# Patient Record
Sex: Female | Born: 1935
Health system: Southern US, Community
[De-identification: ages and names within clinical notes are randomized; demographics above are authoritative.]

## PROBLEM LIST (undated history)

## (undated) DIAGNOSIS — S064XAA Epidural hemorrhage with loss of consciousness status unknown, initial encounter: Secondary | ICD-10-CM

## (undated) DIAGNOSIS — I609 Nontraumatic subarachnoid hemorrhage, unspecified: Secondary | ICD-10-CM

## (undated) DIAGNOSIS — J189 Pneumonia, unspecified organism: Secondary | ICD-10-CM

## (undated) DIAGNOSIS — S064X9A Epidural hemorrhage with loss of consciousness of unspecified duration, initial encounter: Secondary | ICD-10-CM

## (undated) DIAGNOSIS — I639 Cerebral infarction, unspecified: Secondary | ICD-10-CM

## (undated) DIAGNOSIS — J9600 Acute respiratory failure, unspecified whether with hypoxia or hypercapnia: Secondary | ICD-10-CM

## (undated) DIAGNOSIS — A419 Sepsis, unspecified organism: Secondary | ICD-10-CM

## (undated) DIAGNOSIS — I4891 Unspecified atrial fibrillation: Secondary | ICD-10-CM

---

## 1999-02-21 HISTORY — PX: REPLACEMENT TOTAL KNEE: SUR1224

## 1999-11-22 ENCOUNTER — Encounter: Payer: Self-pay | Admitting: Orthopedic Surgery

## 1999-11-28 ENCOUNTER — Inpatient Hospital Stay (HOSPITAL_COMMUNITY): Admission: RE | Admit: 1999-11-28 | Discharge: 1999-12-02 | Payer: Self-pay | Admitting: Orthopedic Surgery

## 1999-12-13 ENCOUNTER — Ambulatory Visit (HOSPITAL_COMMUNITY): Admission: RE | Admit: 1999-12-13 | Discharge: 1999-12-13 | Payer: Self-pay | Admitting: Orthopedic Surgery

## 2002-11-14 ENCOUNTER — Encounter: Admission: RE | Admit: 2002-11-14 | Discharge: 2002-11-14 | Payer: Self-pay | Admitting: Internal Medicine

## 2002-11-14 ENCOUNTER — Encounter: Payer: Self-pay | Admitting: Internal Medicine

## 2003-01-26 ENCOUNTER — Ambulatory Visit (HOSPITAL_COMMUNITY): Admission: RE | Admit: 2003-01-26 | Discharge: 2003-01-26 | Payer: Self-pay | Admitting: *Deleted

## 2003-01-26 ENCOUNTER — Encounter (INDEPENDENT_AMBULATORY_CARE_PROVIDER_SITE_OTHER): Payer: Self-pay | Admitting: Specialist

## 2003-02-10 ENCOUNTER — Encounter (INDEPENDENT_AMBULATORY_CARE_PROVIDER_SITE_OTHER): Payer: Self-pay | Admitting: *Deleted

## 2003-02-10 ENCOUNTER — Ambulatory Visit (HOSPITAL_COMMUNITY): Admission: RE | Admit: 2003-02-10 | Discharge: 2003-02-10 | Payer: Self-pay | Admitting: *Deleted

## 2003-03-01 ENCOUNTER — Emergency Department (HOSPITAL_COMMUNITY): Admission: EM | Admit: 2003-03-01 | Discharge: 2003-03-01 | Payer: Self-pay | Admitting: Emergency Medicine

## 2003-03-02 ENCOUNTER — Other Ambulatory Visit: Admission: RE | Admit: 2003-03-02 | Discharge: 2003-03-02 | Payer: Self-pay | Admitting: Internal Medicine

## 2003-04-09 ENCOUNTER — Encounter (INDEPENDENT_AMBULATORY_CARE_PROVIDER_SITE_OTHER): Payer: Self-pay | Admitting: *Deleted

## 2003-04-09 ENCOUNTER — Ambulatory Visit (HOSPITAL_COMMUNITY): Admission: RE | Admit: 2003-04-09 | Discharge: 2003-04-09 | Payer: Self-pay | Admitting: *Deleted

## 2003-08-04 ENCOUNTER — Ambulatory Visit (HOSPITAL_COMMUNITY): Admission: RE | Admit: 2003-08-04 | Discharge: 2003-08-04 | Payer: Self-pay | Admitting: *Deleted

## 2005-02-21 ENCOUNTER — Encounter: Admission: RE | Admit: 2005-02-21 | Discharge: 2005-02-21 | Payer: Self-pay | Admitting: Internal Medicine

## 2005-03-13 ENCOUNTER — Encounter: Admission: RE | Admit: 2005-03-13 | Discharge: 2005-03-13 | Payer: Self-pay | Admitting: Internal Medicine

## 2005-12-08 ENCOUNTER — Emergency Department (HOSPITAL_COMMUNITY): Admission: EM | Admit: 2005-12-08 | Discharge: 2005-12-08 | Payer: Self-pay | Admitting: Emergency Medicine

## 2006-01-05 ENCOUNTER — Observation Stay (HOSPITAL_COMMUNITY): Admission: EM | Admit: 2006-01-05 | Discharge: 2006-01-06 | Payer: Self-pay | Admitting: Emergency Medicine

## 2006-01-19 ENCOUNTER — Inpatient Hospital Stay (HOSPITAL_COMMUNITY): Admission: RE | Admit: 2006-01-19 | Discharge: 2006-01-23 | Payer: Self-pay | Admitting: *Deleted

## 2006-01-20 ENCOUNTER — Encounter (INDEPENDENT_AMBULATORY_CARE_PROVIDER_SITE_OTHER): Payer: Self-pay | Admitting: Specialist

## 2006-02-26 ENCOUNTER — Encounter: Admission: RE | Admit: 2006-02-26 | Discharge: 2006-02-26 | Payer: Self-pay | Admitting: Internal Medicine

## 2006-05-08 ENCOUNTER — Ambulatory Visit (HOSPITAL_COMMUNITY): Admission: RE | Admit: 2006-05-08 | Discharge: 2006-05-08 | Payer: Self-pay | Admitting: *Deleted

## 2006-05-08 ENCOUNTER — Encounter (INDEPENDENT_AMBULATORY_CARE_PROVIDER_SITE_OTHER): Payer: Self-pay | Admitting: Specialist

## 2006-08-06 ENCOUNTER — Other Ambulatory Visit: Admission: RE | Admit: 2006-08-06 | Discharge: 2006-08-06 | Payer: Self-pay | Admitting: Internal Medicine

## 2007-07-02 ENCOUNTER — Encounter: Admission: RE | Admit: 2007-07-02 | Discharge: 2007-07-02 | Payer: Self-pay | Admitting: Internal Medicine

## 2008-07-15 ENCOUNTER — Inpatient Hospital Stay (HOSPITAL_COMMUNITY): Admission: EM | Admit: 2008-07-15 | Discharge: 2008-07-18 | Payer: Self-pay | Admitting: Emergency Medicine

## 2010-05-31 LAB — COMPREHENSIVE METABOLIC PANEL
ALT: 27 U/L (ref 0–35)
AST: 44 U/L — ABNORMAL HIGH (ref 0–37)
Alkaline Phosphatase: 130 U/L — ABNORMAL HIGH (ref 39–117)
Chloride: 106 mEq/L (ref 96–112)
GFR calc Af Amer: >60 mL/min (ref >60)
GFR calc non Af Amer: 54 mL/min — ABNORMAL LOW (ref >60)
Sodium: 140 mEq/L (ref 135–145)
Total Protein: 7.1 g/dL (ref 6.0–8.3)

## 2010-05-31 LAB — CBC
HCT: 45.9 % (ref 36.0–46.0)
Hemoglobin: 16.5 g/dL — ABNORMAL HIGH (ref 12.0–15.0)
MCHC: 35.9 g/dL (ref 30.0–36.0)
MCV: 84.4 fL (ref 78.0–100.0)
Platelets: ADEQUATE 10*3/uL (ref 150–400)
RBC: 5.44 MIL/uL — ABNORMAL HIGH (ref 3.87–5.11)
WBC: 8.4 10*3/uL (ref 4.0–10.5)

## 2010-05-31 LAB — DIFFERENTIAL
Eosinophils Relative: 1 % (ref 0–5)
Lymphocytes Relative: 21 % (ref 12–46)
Lymphs Abs: 1.8 10*3/uL (ref 0.7–4.0)
Monocytes Relative: 7 % (ref 3–12)
Neutro Abs: 5.8 10*3/uL (ref 1.7–7.7)

## 2010-05-31 LAB — GLUCOSE, CAPILLARY
Glucose-Capillary: 151 mg/dL — ABNORMAL HIGH (ref 70–99)
Glucose-Capillary: 170 mg/dL — ABNORMAL HIGH (ref 70–99)
Glucose-Capillary: 170 mg/dL — ABNORMAL HIGH (ref 70–99)

## 2010-07-05 NOTE — Op Note (Signed)
Alison Doyle, Alison Doyle                  ACCOUNT NO.:  [id number]   MEDICAL RECORD NO.:  [id number]          PATIENT TYPE:  INP   LOCATION:  1823                         FACILITY:  [facility]   PHYSICIAN:  Thomas A. Cornett, M.D.DATE OF BIRTH:  14-Dec-1935   DATE OF PROCEDURE:  07/15/2008  DATE OF DISCHARGE:                               OPERATIVE REPORT   PREOPERATIVE DIAGNOSIS:  Incisional hernia, incarcerated.   POSTOPERATIVE DIAGNOSIS:  Incisional hernia, incarcerated.   PROCEDURE:  Repair of incarcerated incisional hernia.   SURGEON:  Maisie Fus A. Cornett, MD   ASSISTANT:  Kelle Darting. Rennis Harding, NP   ANESTHESIA:  General endotracheal anesthesia with 0.25% Sensorcaine  local.   ESTIMATED BLOOD LOSS:  20 mL.   SPECIMEN:  None.   INDICATIONS FOR PROCEDURE:  The patient is a 75 year old female who has  had a previous laparoscopic cholecystectomy in 2007 by Dr. Gwendlyn Deutscher.  She has developed a hernia in one of her port sites and came in  today with an incarcerated hernia which is an old incisional hernia.  She presents to the operating room for repair of this.   DESCRIPTION OF PROCEDURE:  The patient was brought to the operating  room.  After being placed supine, general anesthesia was initiated and  the periumbilical region was prepped and draped in sterile fashion.  A 1  cm incision was made and we were able to identify the hernia sac.  I  opened the hernia sac and reduced the omentum, but there was no bowel  in it.  I trimmed up the hernia sac.  I then used a roughly 10 x 10  piece of Proceed mesh and placed it in an underlay position in the  intraperitoneal cavity using #1 Novofil, secured to the undersurface of  the fascia with at least 3 cm of underlay.  After doing this, I checked  for any areas of weakness or any areas where bowel could slide through  this and could not feel any.  I irrigated out the cavity and then closed  the fascia with #1 running PDS.  Irrigation was  used.  I placed the  drain in subcutaneous fat.  Due to the dissection, I had to do and close  the skin with staples.  This was placed to bulb suction.  All final  counts of sponge, needle, and instruments were found be correct at this  portion of the case.  The patient was then awoke, taken to the recovery  room in satisfactory condition.  All final counts were found to be  correct.  The drain was placed to bulb suction.     Thomas A. Cornett, M.D.  Electronically Signed    TAC/MEDQ  D:  07/15/2008  T:  07/16/2008  Job:  [id number]

## 2010-07-05 NOTE — Discharge Summary (Signed)
Alison Doyle                  ACCOUNT NO.:  [id number]   MEDICAL RECORD NO.:  [id number]          PATIENT TYPE:  INP   LOCATION:  5151                         FACILITY:  [facility]   PHYSICIAN:  Alison Doyle, Alison DoyleDATE OF BIRTH:  [birth year]   DATE OF ADMISSION:  07/15/2008  DATE OF DISCHARGE:  07/18/2008                               DISCHARGE SUMMARY   PRIMARY CARE PHYSICIAN:  Alison Murphy. Renne Crigler, MD   DISCHARGING PHYSICIAN:  Alison Paris, MD   PROCEDURES:  Open incisional hernia repair with Proceed mesh on Jul 15, 2008.   CONSULTANTS:  None.   REASON FOR ADMISSION:  Alison Doyle is a 75 year old white female who had a  laparoscopic cholecystectomy in 2007 and subsequently developed an  umbilical hernia.  Over the last several months, she states this has  really increased in size and on the day prior to admission, she  developed an acute episode of pain at her hernia.  She denied any nausea  or vomiting and continued to have normal bowel movement and passed  flatus.  At the emergency department, she had a CT scan, which showed a  umbilical hernia that only contained fat.  However, due to her pain, she  was admitted for repair.  Please see admitting history and physical for  further details.   ADMITTING DIAGNOSES:  1. Incarcerated incisional hernia, status post laparoscopic      cholecystectomy.  2. Borderline diabetes mellitus, which the patient states is diet      controlled.   HOSPITAL COURSE:  At this time, the patient was admitted and taken  straight to the operating room where a repair of an incisional hernia  with Proceed mesh was completed.  The patient tolerated this procedure  well.  On postoperative day 1, the patient was complaining of quite a  bit of pain, but was already tolerating regular diet.  She had already  taken some Percocet; however, this turned her skin red as if she had  like a red-man type syndrome and complained of a rash under her breast.  At this time, her Percocet was discontinued and she was put on p.o.  Dilaudid for pain.  She was also given a nystatin powder for what  appeared to be a candidal infection under her breast.  On postoperative  day 2, she was still continuing to have quite a bit of pain, although it  was beginning to get better and by postoperative day 3, the patient had  been ambulating pretty well and was doing much better.  At this point,  she was not having any more pain, tolerating her regular diet and very  eager to be discharged home.   The patient did have a JP drain placed during surgery.  Throughout the  hospitalization, this remained serosanguineous with minimal output.   DISCHARGING DIAGNOSES:  1. Incarcerated incisional hernia.  2. Status post open incisional hernia repair with Proceed mesh.  3. Candida of the breast, improved.  4. Diet-controlled diabetes mellitus.   DISCHARGE MEDICATIONS:  The patient is informed she may resume all  prehospital medications including;  1. Tramadol 50 mg 2 tablets q.4 h. as needed for pain.  2. Aspirin 81 mg daily.   DISCHARGE INSTRUCTIONS:  The patient is informed to continue a carb-  modified diet for her diabetes.   ACTIVITY:  She may increase her activity slowly and walk up steps.  She  may shower; however, she is not to bathe while her JP drain is in place.  She is not to lift anything greater than 10 pounds for the next 8 weeks.  She is also to remain in her abdominal binder for at least the next 4  weeks as well.  It was noted during surgery that her abdominal fascia  was very thin and that she is potentially prone for recurrent hernia or  other hernias in the future.  She is informed to empty her drain as  needed and recharge.  She is to call for fever greater than 101.5 or  worsening pain.  Otherwise, she is to return to the [facility] on July 28, 2008, at 3:15 p.m. for JP drain and staple removal.  After that, she  is to follow up with Dr.  Luisa Doyle in approximately 1 month.      Alison Doyle, Alison Doyle      Alison Doyle, M.D.  Electronically Signed    KEO/MEDQ  D:  07/18/2008  T:  07/19/2008  Job:  [id number]   cc:   Alison Doyle, M.D.  Alison Doyle, M.D.

## 2010-07-05 NOTE — H&P (Signed)
[name], Alison Doyle                  ACCOUNT NO.:  [id number]   MEDICAL RECORD NO.:  [id number]          PATIENT TYPE:  INP   LOCATION:  5151                         FACILITY:  [facility]   PHYSICIAN:  Thomas A. Cornett, M.D.DATE OF BIRTH:  02/24/1935   DATE OF ADMISSION:  07/15/2008  DATE OF DISCHARGE:                              HISTORY & PHYSICAL   ADMITTING PHYSICIAN:  Maisie Fus A. Cornett, M.D.   PRIMARY CARE PHYSICIAN:  Soyla Murphy. Pharr, M.D.   CHIEF COMPLAINT:  Abdominal pain secondary to umbilical hernia.   HISTORY OF PRESENT ILLNESS:  Alison Doyle is a 75 year old white female who  states she has borderline diabetes; however, this is diet controlled,  who is otherwise healthy.  She states she had a laparoscopic  cholecystectomy in 2007 and since then developed a small umbilical  hernia.  She states over the last several years this is increased in  size.  Over the last 4-5 months, it has really increased in size and has  began to cause her some pain every once in a while.  She states that her  primary care physician, Dr. Renne Crigler, recommended that she see one of our  surgeon and had an appointment next week but developed pain yesterday  that was unrelenting.  Since she developed this pain, she has not had  any nausea or vomiting and is continuing to have bowel movements and  passing flatus.  However, due to this pain, she presented to the  emergency department where subsequently a CT scan of abdomen and pelvis  was done which showed a fat containing umbilical hernia with mild  thickening of the umbilicus without a drainable abscess or direct  communication with the bowel.  Because of this, we were called to see  the patient because this hernia was unable to be reduced.   REVIEW OF SYSTEMS:  Please see HPI, otherwise the patient does admit to  having a rash that just appeared this morning under her breast due to a  new power that she used.  She denies any pain or itching with this rash.  Otherwise, all other systems are negative.   PAST MEDICAL HISTORY:  1. Borderline diabetes mellitus which is diet controlled.  2. Arthritis of the back.   PAST SURGICAL HISTORY:  1. Laparoscopic cholecystectomy in 2007.  2. Left total knee replacement.  3. C-section.   SOCIAL HISTORY:  The patient is widowed.  She admits to smoking  approximately 5-6 cigarettes a day but denies alcohol or illicit drug  abuse.   ALLERGIES:  MORPHINE, this causes itching.   MEDICATIONS:  1. Ultram, dose unknown.  She takes it p.r.n. for back pain.  2. Aspirin 81 mg a day.   PHYSICAL EXAMINATION:  GENERAL:  Alison Doyle is a 75 year old obese white  female who is currently laying in bed in no acute distress after  receiving a dose of Dilaudid.  VITAL SIGNS:  Temperature 97.5, pulse 86, respirations 18, and blood  pressure 129/79.  HEENT:  Head is normocephalic and atraumatic.  Sclerae noninjected.  Pupils are equal, round, and  reactive to light.  Ears and nose without  any obvious masses or lesions.  No rhinorrhea.  Mouth is pink and moist.  Throat shows no exudate.  NECK:  Supple.  Trachea is midline.  No thyromegaly.  HEART:  Regular rate and rhythm.  Normal S1 and S2.  No murmurs,  gallops, or rubs are noted.  A+2 carotid, radial, and pedal pulses  bilaterally.  LUNGS:  Clear to auscultation bilaterally with no wheezes, rhonchi, or  rales noted.  Respiratory effort is nonlabored.  Chest is symmetrical.  ABDOMEN:  Soft and obese.  She does have a large umbilical hernia noted  that is currently not reducible secondary to pain.  This area does have  warmth but is not erythematous.  She does have active bowel sounds and  is obese.  She does have 4 scars noted from her prior laparoscopic  cholecystectomy.  MUSCULOSKELETAL:  All 4 extremities are symmetrical.  No cyanosis,  clubbing, or edema.  SKIN:  Warm and dry.  She does have a rash noted under her bilateral  breast that is erythematous.  No  pain or itching associated with this.  NEUROLOGIC:  Cranial nerves II through XII appear to be grossly intact.  PSYCH:  The patient is alert and oriented x3 with an appropriate affect.   LABORATORY DATA AND DIAGNOSTICS:  White blood cell count 8,400,  hemoglobin 16.5, hematocrit 45.9, platelet count was unable to be  determined, sodium 140, potassium 4.4, glucose 286, BUN 12, creatinine  1.02.  CT scan of the abdomen and pelvis revealed fat containing  umbilical hernia with mild thickening of the umbilicus without a  drainable abscess or direct connection with the bowel.   IMPRESSION:  1. Incarcerated umbilical hernia, status post laparoscopic      cholecystectomy.  2. Borderline diabetes mellitus which is diet controlled per the      patient.   PLAN:  Due to the inability to reduce the patient's hernia, I believe  that it will be beneficial to the patient to get admitted, start on IV  fluids and proceed with surgical intervention to correct this hernia.  The surgical procedure has been explained to the patient including risks  and benefits and at this time she understands and questions were  answered and she wishes to proceed.  She will receive a dose of  cefoxitin on-call to the OR and receive SCDs in the OR as well for DVT  prophylaxis.  We will go ahead and obtain a preoperative EKG and one-  view chest x-ray.      Letha Cape, PA      Maisie Fus A. Cornett, M.D.  Electronically Signed   KEO/MEDQ  D:  07/15/2008  T:  07/16/2008  Job:  [id number]   cc:   Soyla Murphy. Renne Crigler, M.D.

## 2010-07-08 NOTE — Op Note (Signed)
Alison Doyle, Alison Doyle                            ACCOUNT NO.:  [id number]   MEDICAL RECORD NO.:  [id number]                   PATIENT TYPE:  AMB   LOCATION:  ENDO                                 FACILITY:  [facility]   PHYSICIAN:  Georgiana Spinner, M.D.                 DATE OF BIRTH:  [birth year]   DATE OF PROCEDURE:  02/10/2003  DATE OF DISCHARGE:                                 OPERATIVE REPORT   PROCEDURE PERFORMED:  Colonoscopy with polypectomy and biopsy.   ENDOSCOPIST:  Georgiana Spinner, M.D.   INDICATIONS FOR PROCEDURE:  Colon polyps.   ANESTHESIA:  Demerol 100 mg, Versed 10 mg.   DESCRIPTION OF PROCEDURE:  With the patient mildly sedated in the left  lateral decubitus position, the Olympus video colonoscope was inserted in  the rectum and passed through a somewhat mildly tortuous sigmoid colon that  was diverticula filled until we reached the cecum identified by the  ileocecal valve which was fairly prominent and the appendiceal orifice, both  of which were photographed.  From this point the colonoscope was slowly  withdrawn taking circumferential views of the entire colonic mucosa  visualized stopping in the hepatic flexure where a small polyp was seen and  removed using hot biopsy forceps technique setting of 20/20 blended current.  In the proximal transverse colon just distal to this lesion was noted a  larger polyp that was removed using snare cautery technique again a setting  of 20/20 blended current. There was good hemostasis.  There was another  smaller polyp nearby that was also removed using hot biopsy forceps  technique.  The endoscope was then withdrawn all the way to sigmoid colon  where there was a small polyp removed using snare cautery technique again a  setting of 20/20 blended current.  It was in the midst of diverticula.  It  was not photographed. Then at the 20 cm from the anal verge, we found a  large multiheaded polypoid mass that was fairly soft.  I elected  because of  size to biopsy only.  It was multi-centimeters but irregular shaped.  The  softness might indicate that it was a villous adenoma but it certainly had  some characteristics of malignancy and therefore biopsies were taken only of  this lesion.  The endoscope was then withdrawn all the way to the rectum and  in the rectum was a smaller polyp that was removed using a snare cautery  technique, again a setting of 20/20 blended current and the endoscope was  placed on retroflexion to view hemorrhoids that were seen both internal and  external view of the anal canal.  The patient's vital signs and pulse  oximeter remained stable.  The patient tolerated the procedure well without  apparent complications.   FINDINGS:  Internal hemorrhoids.  Sigmoid diverticulosis.  Polyp of the  hepatic flexure that actually was sort of  more linear and possibly ulcerated  that was biopsied.  A larger polyp at the proximal transverse colon, a  smaller polyp that was not photographed in the sigmoid colon near the  diverticulosis that was seen previously.  Again, a large polyp/mass at 20 cm  from the anal verge which was biopsied only.  A smaller polyp of the rectum  which was removed using snare cautery technique.   PLAN:  Await biopsy reports.  Patient will call me for results and follow up  with me as an outpatient.                                               Georgiana Spinner, M.D.    GMO/MEDQ  D:  02/10/2003  T:  02/11/2003  Job:  [id number]

## 2010-07-08 NOTE — H&P (Signed)
[place]. [facility]  Patient:    Alison Doyle, Alison Doyle                         MRN: [id number] Adm. Date:  [id number] Disc. Date: [id number] Attending:  Carolan Shiver Ii Dictator:   Jamelle Rushing, P.A.                         History and Physical  DATE OF BIRTH: [birth year]  CHIEF COMPLAINT: Right knee pain since fall in January 2001.  HISTORY OF PRESENT ILLNESS: The patient is a 75 year old white female with a history of a fall while at work on March 01, 1999 and after the fall had significant pain and popping in her knee, which did not improve with time. She had an arthroscopic evaluation in June 2001 which revealed a lateral femoral micro fracture and torn menisci.  She had x-ray re-evaluation in August 2001 which did not show any improvement of the fractures.  She continued to have significant pain at all times but it does improve slightly with Ultram.  She does have popping, swelling, grinding, locking, and giving out of the knee.  The pain is described as a constant throbbing sensation and it worsens with any weightbearing activity and does radiate to the posterior aspect of the calf.  She states that during periods of intensity she will resort to having to use a walker for ambulation.  ALLERGIES: No known drug allergies.  CURRENT MEDICATIONS:  1. Celebrex 200 mg p.o. q.d.  2. Ultram 1-2 tablets p.o. q.4h to q.6h p.r.n. pain.  PAST MEDICAL HISTORY: The patient denies any significant medical problems for which she is being managed by her primary care physician.  PAST SURGICAL HISTORY:  1. Cesarean section in 1985.  3. Cataract surgery in 1965.  3. Cornea transplant, left eye, in 1987.  4. Cornea transplant, right eye, in 1988.  5. Right knee arthroscopy in June 2001.  The patient denies any complications with the above mentioned surgical procedures.  SOCIAL HISTORY: The patient is a 75 year old white female who does have an approximate  20 year smoking history and smokes maybe four to six cigarettes a day.  She denies any alcohol use.  She is currently married and lives with her husband and one son in a one-story house.  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  FAMILY PHYSICIAN: Soyla Murphy. Renne Crigler, M.D. [phone])  FAMILY HISTORY: Mother deceased at the age of 73 from breast cancer.  Father deceased at the age of 71 from vocal cord cancer.  The patient has two brothers alive, one with significant history of lung cancer and the other in good health.  Two sisters are in good health.  REVIEW OF SYSTEMS: Positive for only glasses and contact lenses.  Otherwise, the patient denies any other respiratory, cardiac, sensory, GI, GU, hematologic, musculoskeletal, neurologic, or mental status problems at this time.  PHYSICAL EXAMINATION:  VITAL SIGNS: Weight 220 pounds.  Height 5 feet 9 inches.  Pulse 76, respirations 12, temperature 99 degrees, blood pressure 128/88.  GENERAL: This is a healthy appearing, well-developed, slightly centrally obese female, who ambulates with obvious valgus deformities of the bilateral knees. She gets on and off the examination table without much disability.  HEENT: Head normocephalic, atraumatic.  Nontender over maxillary or frontal sinuses.  PERRLA.  EOMI.  Sclerae nonicteric.  Conjunctivae pink and moist. External  ears without deformity.  Ear canals patent.  TMs pearly gray and intact.  Gross hearing is intact.  Nasal septum midline.  Mucous membranes pink and moist.  No polyps.  Oral buccal mucosa pink and moist, no polyp, without lesions.  Dentition in good repair.  Uvula midline and moved symmetrically with phonation.  NECK: Supple.  No palpable lymphadenopathy.  Thyroid gland nontender.  Good range of motion without any difficulty or tenderness.  CHEST: Lung sounds clear and equal bilaterally.  No wheezes, rales, rhonchi, or rubs noted.  HEART: Regular rate  and rhythm, S1 and S2 auscultated.  No murmurs, rubs, or gallops noted.  ABDOMEN: Slightly obese, soft, nontender.  Bowel sounds present throughout. Unable to palpate any hepatosplenomegaly.  Nontender to percussion.  EXTREMITIES: Upper extremities symmetric in size and shape.  Good motor strength in all muscle groups.  Excellent range of motion in shoulders, elbows, and wrists.  Lower extremities show right and left hip had full extension and flexion back to about 110 degrees with excellent internal and external rotation without any mechanical symptoms or pain.  The left knee had approximately 16 degrees of valgus deformity with full extension and flexion back to about 100 degrees. She was nontender along the medial and lateral joint line.  There was no palpable effusion and no sign of erythema.  No valgus or varus laxity.  The right knee had approximate 25 degree valgus deformity and she was tender along the medial and lateral joint line.  She did not have any significant valgus or varus laxity.  No anterior or posterior drawer.  No palpable effusion.  She did have some crepitus on the patella with range of motion and there was no sign of erythema or ecchymosis.  Peripheral vascular carotid pulses were 2+ with no bruits.  Radial pulses were 2+, femoral pulses 2+, dorsalis pedis pulses 1+, posterior tibial pulses not palpable.  The patient had no lower extremity pedal edema and there were no venous stasis changes noted.  NEUROLOGIC: The patient was conscious, alert, appropriate, and held easy conversation with the examiner.  Cranial nerves 2-12 were grossly intact. Deep tendon reflexes of the upper and lower extremities were symmetric. Sensation was grossly intact from head to toe with delayed touch sensation.  BREAST/RECTAL/GU: Examinations deferred at this time.  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  PLAN: The patient will  be admitted to [facility] on [facility] 30[facility] 1999 and will undergo all routine laboratories and tests prior to undergoing a  planned right total knee replacement by Dr. Priscille Kluver. DD:  11/22/99 TD:  11/22/99 Job: [id number]

## 2010-07-08 NOTE — Op Note (Signed)
[name], Alison Doyle                  ACCOUNT NO.:  [id number]   MEDICAL RECORD NO.:  [id number]          PATIENT TYPE:  AMB   LOCATION:  ENDO                         FACILITY:  [facility]   PHYSICIAN:  Georgiana Spinner, M.D.    DATE OF BIRTH:  1936/02/04   DATE OF PROCEDURE:  05/08/2006  DATE OF DISCHARGE:                               OPERATIVE REPORT   PROCEDURE:  Colonoscopy.   INDICATIONS:  Colon polyps.   ANESTHESIA:  Fentanyl 125 mcg, Versed 12 mg.   PROCEDURE:  With patient mildly sedated in the left lateral decubitus  position, the Pentax videoscopic colonoscope was inserted in the rectum,  passed through a tortuous diverticular filled sigmoid colon to reach the  cecum identified by ileocecal valve and appendiceal orifice, both which  were photographed.  From this point the colonoscope was slowly withdrawn  taking circumferential views of colonic mucosa stopping at the hepatic  flexure, at which point the large polyp was seen.  I could not tell if  this was just a thickened fold and I could not get enough detail to tell  if this was a lipoma for instance.  Therefore I elected to photograph it  as best I could and biopsy it.  Multiple biopsies were taken in the  hepatic flexure area of this questionable polyp.  Once accomplished, the  endoscope was then further withdrawn taking circumferential views of  remaining colonic mucosa stopping to photograph thickened diverticular  fold seen in the sigmoid colon until we reached the rectum which  appeared normal on direct and showed hemorrhoids on retroflexed view.  The endoscope was straightened and withdrawn.  The patient's vital signs  and pulse oximeter remained stable.  The patient tolerated procedure  well without apparent complications.   FINDINGS:  1. Diverticulosis, moderately severe with thickened folds in the      sigmoid colon.  2. Internal hemorrhoids  3. Question of polyp at the hepatic flexure.  Await biopsy report.  The patient will call me for results and follow-up with me as an      outpatient as needed.           ______________________________  Georgiana Spinner, M.D.     GMO/MEDQ  D:  05/08/2006  T:  05/08/2006  Job:  [id number]

## 2010-07-08 NOTE — Discharge Summary (Signed)
[name], Alison Doyle                  ACCOUNT NO.:  [id number]   MEDICAL RECORD NO.:  [id number]          PATIENT TYPE:  INP   LOCATION:  5733                         FACILITY:  [facility]   PHYSICIAN:  Beckey Rutter, MD  DATE OF BIRTH:  Jul 14, 1935   DATE OF ADMISSION:  01/19/2006  DATE OF DISCHARGE:  01/23/2006                               DISCHARGE SUMMARY   PRIMARY CARE PHYSICIAN:  Soyla Murphy. Renne Crigler, M.D.   DATE OF ADMISSION:  January 19, 2006   DATE OF DISCHARGE:  Will be determined at actual discharge.   CHIEF COMPLAINT:  Yellow discoloration of the skin and sclerae and urine  as well as pruritus.   [name] is a 75 year old Caucasian female with past medical history  significant for recent admission before this time for chest pain.  At  that time, patient was rule out for acute coronary syndrome and she was  advised to have a stress test as an outpatient.  Upon admission this  time, patient was found to have elevated liver enzymes with evidence of  acute cholecystitis.  Patient was operated on the 1st of December by Dr.  Cyndia Bent.  Before that, patient had ERCP done by Dr. Sabino Gasser  and the finding of the ERCP showing gallstone in the gallbladder, but no  other extrahepatic filling defects noted.  After surgery, the patient  developed right flank pain and, thus, requiring increasing doses of  analgesic and muscle relaxants started yesterday with some improvement,  but the patient is still complaining of the pain.  She will be observed  for 24 hours more before discharge.  The patient had a nuclear medicine  test to verify if there is a bowel leak which was negative as will be  discussed below.   DISCHARGE DIAGNOSES:  1. Chronic calculus.  2. Cholecystitis with probable recent passage of common duct stone      status post endoscopic retrograde cholangiogram and laparoscopic      cholecystectomy.   PROCEDURES DURING THIS HOSPITALIZATION:  1. ERCP done by Dr. Virginia Rochester  with impression of negative for ductal stone.      Gallstones.  During surgery, cholangiogram showing normal      intraoperative cholangiogram.  2. Surgical biopsy showing gallbladder chronic cholecystitis and      chololithiasis.  This surgical biopsy was done on the 1st of      December 2007.  3. On the 2nd of December 2007, the patient had nuclear medicine      Hepatolite for liver function to verify if there is any leakage,      especially with the pain that she is complaining of on the right      side of her abdomen.  The impression is status post      cholecystectomy, no evidence of bowel leak.      Beckey Rutter, MD  Electronically Signed    EME/MEDQ  D:  01/23/2006  T:  01/24/2006  Job:  [id number]

## 2010-07-08 NOTE — Discharge Summary (Signed)
. [facility]  Patient:    Alison Doyle, Alison Doyle                         MRN: [id number] Adm. Date:  [id number] Disc. Date: [id number] Attending:  Carolan Shiver Ii Dictator:   Jamelle Rushing, P.A.                           Discharge Summary  DATE OF BIRTH:  Jul 01, 1935  ADMISSION DIAGNOSIS:  End-stage osteoarthritis with valgus deformity of right knee.  DISCHARGE DIAGNOSIS:  Right total knee arthroplasty.  HISTORY OF PRESENT ILLNESS:  This patient is a 75 year old white female with a history of a fall while at work on March 01, 1999.  After the fall the patient hemodialysis a significant pain and popping in her knee which did not improve over time.  The patient had arthroscopic evaluation in June 2001 which revealed a lateral femoral microfracture and a torn menisci.  Re-evaluation with x-ray in August 2001 revealed no improvement of the fracture.  The patient currently is having significant pain at all times which has very slight improvement with Ultram.  The patient does have popping, swelling, grinding, locking, and giving out of the knee.  The pain is described as a constant throbbing sensation which worsens with weightbearing activity, and it does radiate down the posterior aspect of the calf.  ALLERGIES:  No known drug allergies.  CURRENT MEDICATIONS: 1. Celebrex 200 mg p.o. q.d. 2. Ultram one or two tablets q.4-6h. p.r.n. pain.  OPERATIONS PERFORMED:  On November 28, 1999, the patient was taken to the operating room by Dr. Jonny Ruiz L. Rendall assisted by Jamelle Rushing, PA-C.  Under general anesthesia the patient had a right total knee replacement performed without any complications.  The patient was transferred to the recovery room in good condition.  CONSULTS:  On November 28, 1999, the following routine consults were requested: Physical therapy, occupational therapy, rehab, care management, and pharmacy for Coumadin dosing for DVT  prophylaxis.  HOSPITAL COURSE:  On November 28, 1999, the patient was admitted to [facility] under the care of Dr. Jonny Ruiz L. Rendall.  The patient was taken to the operating room where a right total knee arthroplasty was performed.  The patient tolerated the surgical procedure well and was transferred to the orthopedic floor in good condition.  Then proceeded to have a four-day postoperative course which was pretty much uneventful and was routine.  The patient did have a slow initiation phase with the CPM, but by discharge she was up to 65 degrees without much discomfort.  The patients H & H did drop to 10.4 and 29.2, but she remained totally asymptomatic.  The patients right lower extremity remained neuromotor vascularly intact, the wound remained benign, and she had no Homans sign throughout her hospitalization.  The patient worked very well with physical therapy.  The patient was very eager to be discharged on postop day #4, so this was done so with the patient in good condition.  LABORATORY AND X-RAY DATA:  CBC on October 11 found WBC of 8.7, hemoglobin 10.4, hematocrit 29.2, platelets 165.  On October 12 PT was 17.0 with a 1.7 INR.  Routine chemistries during admission were normal with the exception of glucose on October 9 and 10 was 140.  Urinalysis on October 8 found small leukocyte esterase, many epithelial cells, and many bacteria.  MEDICATIONS ON DISCHARGE: 1. Celebrex 200 mg p.o. q.8h. changed to p.o. q.d. 2. OxyContin CR 10 mg p.o. q.12h. 3. Milk of Magnesia 30 ml p.r.n. 4. Coumadin 5 mg p.o. q.d. 5. OxyIR 5 mg one or two tablets q.4-6h. p.r.n. pain.  DISCHARGE INSTRUCTIONS: 1. Medications:    a. The patient is to continue routine meds.    b. OxyContin CR 10 mg one tablet q.12h., dispense #30.    c. OxyIR 5 mg one or two tablets q.4-6h. p.r.n. pain, dispense #40.    d. Coumadin 5 mg one tablet q.d. unless changed by [facility] pharmacist,       dispense #30. 2.  Activity:  As tolerated.  When walking use leg immobilizer and walker. 3. Diet:  Unrestricted. 4. Wound care:  Keep wound clean and dry.  Check daily for infection, any    increased redness, pain, foul discharge, swelling, or temperature.  The    patient is to call physician if present.  FOLLOWUP:  The patient is to call [phone] for a follow-up appointment with Dr. Priscille Kluver in 10 days.  CONDITION ON DISCHARGE:  The patients condition on discharge to home is listed as good. DD:  12/02/99 TD:  12/04/99 Job: [id number]

## 2010-07-08 NOTE — H&P (Signed)
NAMENAKEMA, FAKE NO.:  [id number]   MEDICAL RECORD NO.:  [id number]          PATIENT TYPE:  EMS   LOCATION:  MAJO                         FACILITY:  [facility]   PHYSICIAN:  Elliot Cousin, M.D.    DATE OF BIRTH:  1935/04/27   DATE OF ADMISSION:  01/05/2006  DATE OF DISCHARGE:                                HISTORY & PHYSICAL   PRIMARY CARE PHYSICIAN:  Soyla Murphy. Renne Crigler, M.D.   CHIEF COMPLAINT:  Chest pain, nausea and vomiting.   HISTORY OF PRESENT ILLNESS:  The patient is a 75 year old woman with a past  medical history significant for degenerative joint disease, colon polyps,  and status post corneal transplants bilaterally, who presents to the  emergency department with a chief complaint of chest pain.  The patient's  chest pain started at approximately 2 to 3 o'clock yesterday afternoon.  It  was accompanied by shortness of breath and pleurisy.  It was considered  sharp and severe in nature.  It radiated to the back.  No radiation to the  arm or jaw.  She had transient clamminess.  The pain lasted approximately 4  hours.  She took several Ultram with no relief.  Approximately 1 hour after  she took the Ultram, she experienced 2 episodes of nausea and vomiting.  There was no coffee ground emesis or bright red blood in the emesis.  The  patient has had no recent complaints of heartburn.  She has no had any heavy  lifting.  She actually presented to the emergency department on December 08, 2005 with the same complaint.  At that time, a CT scan of the chest was  ordered to rule out PE.  The CT scan revealed no evidence of pulmonary  embolus, although the study was somewhat limited.  The patient was given  morphine and Reglan in the emergency department.  Her symptoms resolved, and  she was therefore discharged to home.  When the patient arrived home, she  began to break out into a diffuse rash.  The patient says that she may be  allergic to morphine and the other  medication (Reglan).   During the emergency department visit tonight, she is noted to be afebrile  and hemodynamically stable.  She was given a GI cocktail by the emergency  department physician.  She says that soon after she drank it, her chest pain  completely went away.  Her EKG revealed sinus bradycardia with a heart rate  of 58 beats per minute and no other abnormalities.  Her initial cardiac  markers are negative.  She will, however, be admitted for further evaluation  and management.   PAST MEDICAL HISTORY:  1. Emergency department visit December 08, 2005 for chest pain.  The CT      scan of the chest was negative for PE; however, there was evidence of a      fatty liver, low attenuation lesion in the thyroid gland, and stable      pulmonary nodules.  2. Internal hemorrhoids and colon polyps per colonoscopies in 2004 and  2005.  3. Bilateral corneal transplants in 1987 and 1988.  The patient says that      she is nearly blind.  4. Status post right total knee replacement.  5. Status post cesarean section in the past.  6. Tobacco use.  7. Radiographic evidence of COPD.   MEDICATIONS:  Tramadol 50 mg every 6 hours as needed for pain.   ALLERGIES:  The patient believes that she is now allergic to either MORPHINE  and/or REGLAN.   SOCIAL HISTORY:  The patient is widowed.  She has 1 son.  [place]  She is a retired Firefighter.  She smokes 1  cigarette per day; however, she has a history of smoking a half a pack to a  pack of cigarettes per day for many years.  She started smoking at 75 years  of age.  She denies alcohol and illicit drug use.   FAMILY HISTORY:  Her father died of cancer of the larynx.  The etiology of  her mother's death is unknown; however, she did have diabetes and breast  cancer.   REVIEW OF SYSTEMS:  The patient's review of systems is positive for left  knee pain, occasional right knee pain, low back pain.   Otherwise, review of  systems is negative.   PHYSICAL EXAMINATION:  VITAL SIGNS:  Temperature 99.1, blood pressure  168/79, pulse 95, respiratory rate 18, oxygen saturation 98% on room air.  GENERAL:  The patient is a pleasant, obese 75 year old Caucasian woman who  is currently lying in bed in no acute distress.  HEENT:  Head is normocephalic and non-traumatic.  Pupils equal, round and  reactive to light.  Extraocular movements are intact.  Conjunctivae are  clear.  Sclerae are white.  Nasal mucosa is mildly dry.  No sinus  tenderness.  Oropharynx reveals multiple missing teeth (the patient says  that she has a partial plate that is not in).  Mucous membranes are mildly  dry.  No posterior exudates or erythema.  NECK:  Supple.  No adenopathy, no thyromegaly, no bruit, no JVD.  LUNGS:  Decreased breath sounds in the bases.  Otherwise, clear to  auscultation bilaterally.  HEART:  S1 and S2 with no murmurs, rubs, or gallops.  CHEST:  Chest wall with no palpable tenderness.  No edema and no erythema.  ABDOMEN:  Obese, positive bowel sounds, soft, mildly tender over the left  lower quadrant.  No rebound, no guarding, no distention, no  hepatosplenomegaly, no masses palpated.  RECTAL/GU:  Deferred.  EXTREMITIES:  Pedal pulses barely palpable bilaterally.  No pretibial edema  and no pedal edema.  NEUROLOGIC:  The patient is alert and oriented x3.  Cranial nerves II-XII  are intact.  Strength is 5/5 throughout.  Sensation is intact.   ADMISSION LABORATORIES:  Chest x-ray reveals chronic lung changes and COPD.  Otherwise, no acute cardiopulmonary findings.  EKG reveals sinus bradycardia  with a heart rate of 58 beats per minute, myoglobin 59.4, troponin I less  than 0.5.  CK-MB less than 1.0.  Sodium 140, potassium 4.1, chloride 111,  CO2 of 25, BUN 16, creatinine 1.0, glucose 188.  WBC 12.8, hemoglobin 17.0,  platelets 194.  ASSESSMENT:  1. Chest pain.  The patient's chest pain appears to  be somewhat atypical      for cardiac disease; however, because she is post menopausal, she is at      risk for coronary artery disease.  In  addition, the patient probably      has newly-diagnosed diabetes mellitus, which is another significant      risk factor.  The differential diagnoses include angina, pulmonary      embolism, esophagitis, and gastroesophageal reflux disease.  It is      interesting to note that the patient's pain subsided after the GI      cocktail.  She was also evaluated in October of 2007 for the same      symptoms.  A CT scan of the chest at that time was negative for PE.  2. Nausea and vomiting.  The nausea and vomiting could be secondary to      cardiac pain and/or a GI source and.  3. Elevated glucose.  The patient's venous glucose is 188.  She was told      that she may have borderline diabetes mellitus by her primary care      physician.  The patient more than likely does have overt type 2      diabetes mellitus.  4. Polycythemia.  The patient's hemoglobin is 17.0.  She is a smoker.  No      history of any other hematological problems.  5. Tobacco abuse.  6. Fatty liver and low attenuation lesions in the thyroid gland per CT      last month.  The patient may require outpatient monitoring.   PLAN:  1. The patient will be admitted for further evaluation and management.  2. Will check cardiac enzymes q.8h. x3.  Will check a 2-D echocardiogram      for further evaluation.  Will also assess the patient's BNP, TSH,      fasting lipid profile and homocystine level.  3. Will check a D-dimer.  Will also check a VQ scan to rule out PE.  4. Will check an ultrasound of the abdomen to evaluate for a possible GI      source of the chest pain and nausea and vomiting.  5. Will place Nitropaste a quarter of an inch every 6 hours x24 hours.      Will also start Mylanta 15 cc t.i.d.  Will also start Protonix 40 mg      daily.  6. Will monitor the patient's blood glucose and  start diabetes instruction      and diet counseling.  Will check a hemoglobin A1C.      Elliot Cousin, M.D.  Electronically Signed     DF/MEDQ  D:  01/05/2006  T:  01/05/2006  Job:  [id number]   cc:   Soyla Murphy. Renne Crigler, M.D.

## 2010-07-08 NOTE — Discharge Summary (Signed)
[facility]  Patient:    Alison Doyle, Alison Doyle                         MRN: [id number] Adm. Date:  [id number] Disc. Date: [id number] Attending:  Carolan Shiver Ii Dictator:   Jamelle Rushing, P.A. CC:         Soyla Murphy. Renne Crigler, M.D.   Discharge Summary  ADMISSION DIAGNOSIS:  End-stage osteoarthritis with valgus deformity right leg.  DISCHARGE DIAGNOSIS:  Status post right total knee arthroplasty.  PROCEDURES:  On November 28, 1999, the patient was taken to the OR by Jonny Ruiz L. Dorothyann Gibbs, M.D., assisted by Jamelle Rushing, P.A.C.  Under general anesthesia, the patient underwent a right total knee arthroplasty.  The patient tolerated the surgical procedure well.  Estimated tourniquet time was 48 minutes.  The patient was transferred to the recovery in good condition. There were no drains left in place, no complications.  CONSULTATIONS:  On November 28, 1999, the following routine consults were requested. 1. Physical therapy. 2. Occupational therapy. 3. Rehabilitation. 4. Case management. 5. Pharmacy for routine dosing of Coumadin for DVT prophylaxis.  HOSPITAL COURSE:  On November 28, 1999, the patient was admitted to the [facility] under the care of Dr. Jonny Ruiz L. Rendall.  The patient was taken to the OR, where a right total knee arthroplasty was performed.  The patient tolerated the surgical procedure well and was transferred to the recovery room and then to the orthopedic floor in good condition.  During the patients four-day postoperative course, the patient had no significant complications or untoward events.  The patient progressed very nicely with physical therapy.  The patient did have a slight temperature on the first postoperative day, but it did resolve without any other significant findings of source or problems.  The patient progressed very nicely after a slow start with the CPM, to the point where she was greater than 55 degrees  on discharge.  The patients right lower extremity remained neuromotor vascularly intact.  The wound remained benign, and she had no Homans sign.  The patient was initially managed with PCA morphine, but this was changed over to OxyContin on postoperative day #2, and the patient continued to have good pain management.  It was felt on postoperative day #4 that the patient was ready to go home, so she was discharged to home.  LABORATORY DATA:  EKG on admission was normal sinus rhythm at 77 beats per minute.  CBC on December 01, 1999:  WBC 8.7, hemoglobin 10.4, hematocrit 29.2, platelets 165.  Coagulation studies on December 02, 1999:  PT was 17.0 with 1.7 INR. Routine chemistries on November 30, 1999, were within normal limits with the exception of glucose at 140.  Urinalysis on admission had small leukocyte esterase, many epithelial cells, and many bacteria.  DISCHARGE MEDICATIONS: 1. Colace 100 mg p.o. b.i.d. 2. Celebrex 200 mg p.o. q.8h. 3. OxyContin CR 10 mg p.o. q.12h. 4. Milk of magnesia p.o. p.r.n. 5. Phenergan 25 mg p.o. p.r.n. 6. Coumadin per pharmacy dosing. 7. OxyIR 5 mg 1 or 2 tablets every four to six hours p.r.n. pain.  DISCHARGE INSTRUCTIONS: 1. Medications:  The patient is to continue routine medications.    a. OxyContin CR 10 mg 1 tablet every 12 hours.    b. OxyIR 1 or 2 tablets every four to six hours for pain.    c. Coumadin 5 mg 1 tablet a  day unless changed by [facility] pharmacist. 2. Activity as tolerated, with the use of a walker and must use leg    immobilizer. 3. Wound care:  The patient is to keep wound clean and dry.  Check daily for    infection, any increased redness, pain, foul-smelling discharge, swelling,    or temperature.  If present, call physician. 4. Follow-up:  The patient is to call [phone] for a follow-up appointment    with Dr. Priscille Kluver in 10 days.  DISPOSITION:  Home.  CONDITION ON DISCHARGE:  Improved. DD:  12/11/99 TD:  12/12/99 Job:  [id number]

## 2010-07-08 NOTE — Discharge Summary (Signed)
Alison Doyle, Alison Doyle                  ACCOUNT NO.:  [id number]   MEDICAL RECORD NO.:  [id number]          PATIENT TYPE:  INP   LOCATION:  3739                         FACILITY:  [facility]   PHYSICIAN:  Beckey Rutter, MD  DATE OF BIRTH:  04-12-1935   DATE OF ADMISSION:  01/04/2006  DATE OF DISCHARGE:  01/06/2006                               DISCHARGE SUMMARY   PRIMARY CARE PHYSICIAN:  Dr.Pharr.   CHIEF COMPLAINT:  Chest pain upon admission.   DISCHARGE DIAGNOSES:  1. Chest pain likely to noncardiogenic causes.  2. Glucose intolerance.  3. Intervention of hemorrhoids and colon polyps per colonoscopy in      2004 and 2005.  4. Bilateral corneal transplants, 1987, 1988.  5. Status post right total knee replacement.  6. Status post C section in the past.  7. Tobacco use with radiographic evidence of COPD.   DISCHARGE MEDICATIONS:  Patient will be discharged on Glucometer and  baby aspirin.   PROCEDURES DURING HOSPITALIZATION:  1. Abdominal ultrasound done on January 05, 2006, showing impression      of gallstones without ultrasound evidence of acute cholecystitis.  2. Fatty liver.  3. Left renal cyst.  Now this cyst was discussed with the patient and      the importance of ultrasonic followup was expressed.   PROCEDURE:  Patient had a V/Q scan done on the 16th of November with no  evidence of PE and no segmental or lobar perfusion defects.  No  ventilation defects, and overall it is low probability for PE.   HOSPITAL COURSE:  1. Chest pain.  Patient has a troponin, cardiac enzyme, EKG, and there      is no evidence of acute coronary syndrome at this time.  Patient      was counseled to have maybe a stress test as an outpatient, and she      is agreeable to that.  She does not want to stay in the hospital      for only that reason.  Patient is stable now for discharge to      follow up with her primary.  2. Suspicion of PE.  As per the V/Q scan, no evidence of PE, patient  was stopped from the anticoagulation, and she would just start on      aspirin.  3. Smoking cessation.  Patient is counseled, though she is not ready      for that, but all the options were discussed with her and she was      encouraged to quit.  Of note there is some evidence of COPD      mentioned on the radiographic chest x-ray.  4. Renal cyst.  The renal cyst as mentioned here is small.      Physically, there is a hyperechoic lesion with increased total      transmission in the left kidney, measuring 4.4 x 3.9 x 3.2,      consistent with a cyst.  The abdominal exam  is unremarkable.  This      was discussed with the patient, and  she was agreeable to have a      followup ultrasound for this cyst.   DISCHARGE MEDICATIONS:  1. Glucometer and Glucometer strips.  2. Aspirin 81 .   Of note, patient has a suspicion of diabetes, recently diagnosed, though  during her hospital stay there is no evidence of diabetes.  Her  postprandial finger stick is in the early 170-171 for the day she stayed  here.  Also her fasting glucose is not remarkably high.  Patient's  hemoglobin A1c is 6.5.  At this time we would consider controlling her  diabetes with exercise, lifestyle modification and diet control.  The  patient will be prescribed a Glucometer for further assessment of  fasting blood glucose and further monitoring.   Beckey Rutter, MD  Electronically Signed     EME/MEDQ  D:  01/06/2006  T:  01/07/2006  Job:  [id number]

## 2010-07-08 NOTE — Consult Note (Signed)
NAMEROXANNA, MCEVER                  ACCOUNT NO.:  [id number]   MEDICAL RECORD NO.:  [id number]          PATIENT TYPE:  INP   LOCATION:  5733                         FACILITY:  [facility]   PHYSICIAN:  Ollen Gross. Vernell Morgans, M.D. DATE OF BIRTH:  [birth year]   DATE OF CONSULTATION:  01/19/2006  DATE OF DISCHARGE:                                 CONSULTATION   Ms.  Woolman is a 75 year old white female who here today underwent an  outpatient ERCP by Dr. Virginia Rochester.  She has had known gallstones and has  recently been in the hospital with some epigastric pain that radiated  into her back.  Her pain is resolved now.  She was noted on today's labs  to have elevated liver functions with a total bilirubin of 7.  Dr. Virginia Rochester  was concerned about this had her admitted to the hospital.  She denies  any nausea or vomiting.  She has no abdominal pain now.  No chest pain,  shortness of breath, no diarrhea or dysuria.  The rest of the review of  systems are unremarkable.   PAST MEDICAL HISTORY:  Significant for degenerative joint disease and  colon polyps.   PAST SURGICAL HISTORY:  Significant for corneal transplant, C-section,  total knee replacement.   MEDICATIONS:  Tramadol and aspirin.   ALLERGIES:  MORPHINE.   SOCIAL HISTORY:  She denies use of alcohol or tobacco products.   FAMILY HISTORY:  Significant for laryngeal cancer and COPD in her  father.  Diabetes and breast cancer in her mother.   PHYSICAL EXAMINATION:  GENERAL:  She is a well-developed, well-nourished  white female in no acute distress.  SKIN:  Warm and dry with slight jaundice.  EYES:  Her extraocular muscles are intact.  Pupils equal, round, and  reactive to light.  Sclerae is slightly icteric.  LUNGS:  Clear bilaterally with no use of accessory respiratory muscles.  HEART:  Regular rate and rhythm with impulse in the left chest.  ABDOMEN:  Soft, nontender with no palpable mass or hepatosplenomegaly.  EXTREMITIES:  No cyanosis, clubbing, or  edema.  Good strength in arms  and legs.  PSYCHOLOGICALLY:  She is alert and oriented x3 with no evidence of  anxiety or depression.   On review of her lab work, it was significant for a normal white count,  a total bilirubin of 7.5, and elevated liver functions.  Her previous CT  scan did show stones in her gallbladder.   ASSESSMENT AND PLAN:  This is a 75 year old white female with gallstonesite female with gallstones  and jaundice.  She had an  endoscopic retrograde  cholangiopancreatography today that showed no residual common duct  stones.  We will plan to recheck her liver functions tomorrow, and if  her liver functions are coming down indicating that she has passed a  stone, then she may be ready to have her gallbladder removed, so that  this does not continue to happen for her.  I have explained to her in  detail the risks and benefits of the operation to remove the gallbladder  as well as  some technical aspects, and she understands and agrees with  this treatment plan.      Ollen Gross. Vernell Morgans, M.D.  Electronically Signed     PST/MEDQ  D:  01/19/2006  T:  01/20/2006  Job:  [id number]

## 2010-07-08 NOTE — Op Note (Signed)
Alison Doyle, Alison Doyle                            ACCOUNT NO.:  [id number]   MEDICAL RECORD NO.:  [id number]                   PATIENT TYPE:  AMB   LOCATION:  ENDO                                 FACILITY:  [facility]   PHYSICIAN:  Georgiana Spinner, M.D.                 DATE OF BIRTH:  August 08, 1935   DATE OF PROCEDURE:  04/09/2003  DATE OF DISCHARGE:                                 OPERATIVE REPORT   PROCEDURE PERFORMED:  Colonoscopy with polypectomy.   ENDOSCOPIST:  Georgiana Spinner, M.D.   INDICATIONS FOR PROCEDURE:  Rectal bleeding, colon polyp.   ANESTHESIA:  Demerol 75 mg,  Versed 7 mg.   DESCRIPTION OF PROCEDURE:  With the patient mildly sedated in the left  lateral decubitus position, the Olympus video colonoscope was inserted in  the rectum and passed under direct vision proximal to the splenic flexure, I  believe.  From this point the colonoscope was slowly withdrawn taking  circumferential views of the colonic mucosa stopping at approximately 20 cm  from the anal verge at which point a previously noted black polyp was seen  and once again photographed and removed using snare cautery technique,  setting of 20/20 blended current.  Once the bulk of the tissue had been  removed and remainder of what appeared to be small amounts of polypoid  tissue was eradicated using Erbe argon photocoagulator.  At the end of the  procedure, I saw no polypoid tissue remaining.  Photograph was taken.  The  patient's vital signs and pulse oximeter remained stable.  The patient  tolerated the procedure well without apparent complications.   FINDINGS:  Large polyp at 20 cm from the anal verge removed using snare  cautery technique and Erbe argon photocoagulator.   PLAN:  Await biopsy report.  Will have patient call me for results and  follow up with me as an outpatient.                                               Georgiana Spinner, M.D.    GMO/MEDQ  D:  04/09/2003  T:  04/09/2003  Job:  [id number]

## 2010-07-08 NOTE — Op Note (Signed)
Alison Doyle, Alison Doyle                            ACCOUNT NO.:  [id number]   MEDICAL RECORD NO.:  [id number]                   PATIENT TYPE:  AMB   LOCATION:  ENDO                                 FACILITY:  [facility]   PHYSICIAN:  Georgiana Spinner, M.D.                 DATE OF BIRTH:  Jul 29, 1935   DATE OF PROCEDURE:  08/04/2003  DATE OF DISCHARGE:                                 OPERATIVE REPORT   PROCEDURE PERFORMED:  Flexible sigmoidoscopy.   INDICATIONS FOR PROCEDURE:  Rectal bleeding.  Follow-up of colon polyp.   ANESTHESIA:  None given.   DESCRIPTION OF PROCEDURE:  With the patient in the left lateral decubitus  position, the Olympus video colonoscope was inserted in the rectum and  passed under direct vision to approximately 40% from the anal verge and from  this point, the colonoscope was then slowly withdrawn, taking  circumferential views of the colonic mucosa; however, at approximately 20 cm  from the anal verge in the area of interest where a previous polyp had been  noted, she had solid stool that I could not suction.  Therefore, we withdrew  the colonoscope and gave her an enema.  Subsequently to passing out this  material, the endoscope was then reinserted into the rectum and it was noted  that this area was then clear of fecal debris.  We were able to get back up  to 40 cm from the anal verge and withdraw the colonoscope taking  circumferential views of the colonic mucosa visualized.  No polypoid tissue  was noted.  The endoscope was placed on retroflexion and internal  hemorrhoids were noted.  The endoscope was straightened and withdrawn.  Patient's vital signs and pulse oximeter remained stable.  The patient  tolerated the procedure well without any complication.   FINDINGS:  Unremarkable flexible sigmoidoscopy other than internal  hemorrhoids.   PLAN:  Have patient follow up with me as an outpatient in two years or as  needed.         Georgiana Spinner, M.D.    GMO/MEDQ  D:  08/04/2003  T:  08/04/2003  Job:  [id number]

## 2010-07-08 NOTE — Op Note (Signed)
NAMECARLEA, BADOUR                  ACCOUNT NO.:  [id number]   MEDICAL RECORD NO.:  [id number]          PATIENT TYPE:  INP   LOCATION:  5733                         FACILITY:  [facility]   PHYSICIAN:  Currie Paris, M.D.DATE OF BIRTH:  1935/08/24   DATE OF PROCEDURE:  01/20/2006  DATE OF DISCHARGE:                               OPERATIVE REPORT   PREOPERATIVE DIAGNOSIS:  Chronic calculous cholecystitis with probable  recent passage of common duct stone, status post endoscopic retrograde  cholangiopancreatogram.   POSTOPERATIVE DIAGNOSIS:  Chronic calculous cholecystitis with probable  recent passage of common duct stone, status post endoscopic retrograde  cholangiopancreatogram.   OPERATION:  Laparoscopic cholecystectomy with operative cholangiogram.   SURGEON:  Currie Paris, M.D.   ASSISTANT:  Ovidio Kin, M.D.   ANESTHESIA:  General endotracheal.   CLINICAL HISTORY:  This is a 62-year lady who recently presented with  some jaundice, which is now improving.  She had an ERCP which was  negative for common duct stones, but had multiple gallstones.  It was  felt by her gastroenterologist that she had probably passed a common  duct stone.  She was admitted and since her liver functions appeared to  be improving, it was felt appropriate to proceed to cholecystectomy.   DESCRIPTION OF PROCEDURE:  The patient was seen in the holding area and  she had no further questions.  She was taken to the operating room and  after satisfactory general endotracheal anesthesia had been obtained,  the abdomen was prepped and draped.  The time-out occurred.   0.25% plain Marcaine was used for each incision.  A midline incision was  made just above the umbilicus, the fascia identified and the peritoneal  cavity entered.  A pursestring was placed, the Hasson introduced and the  abdomen insufflated to 15.   The liver looked basically normal.  The gallbladder was not particularly  tense or  inflamed.  A 10/11 trocar was placed under direct vision in the  midline epigastric area and two 5-mm laterally.  The gallbladder was  retracted over the liver.  The perineum over the cystic duct was opened  and the cystic duct identified and dissected around.  It was somewhat  dilated.  A clip was placed on it at the junction of the gallbladder and  a catheter introduced for cholangiography.  This showed a slightly large  common duct, but good filling of the duodenum, no filling defects and  good filling of the hepatic radicals.   The cystic duct was clipped 3 times on the stay side and divided.  This  branches of cystic artery were dissected out, clipped and divided and  the gallbladder removed from below to above with coagulation current of  the cautery.  Once it was disconnected, it was placed in a bag and we  irrigated and made sure everything was dry.  There were a couple of  small branches along the posterior edge that were clipped and small  areas in the bed of the gallbladder that were coagulated.  I left a  little Surgicel in place,  given the patient's prophylactic  anticoagulation because her obesity and other medical issues.  Everything appeared to be dry.   The gallbladder was brought out the umbilical port.  This was occluded  for few moments while I did a final irrigation and checked for  hemostasis and again, everything appeared dry.  The lateral ports were  removed under direct vision.  The abdomen was deflated through the  epigastric port, which was then removed.   The skin was closed with 4-0 Monocryl subcuticular plus Dermabond.   The patient tolerated the procedure well.  There were no operative  complications and all counts were correct.      Currie Paris, M.D.  Electronically Signed     CJS/MEDQ  D:  01/20/2006  T:  01/22/2006  Job:  [id number]

## 2010-07-08 NOTE — Op Note (Signed)
[place]. [facility]  Patient:    Alison Doyle, Alison Doyle                         MRN: [id number] Proc. Date: 11/28/99 Adm. Date:  [id number] Attending:  Carolan Shiver Ii                           Operative Report  PREOPERATIVE DIAGNOSIS:  Osteoarthritis, right knee.  POSTOPERATIVE DIAGNOSIS:  Osteoarthritis, right knee.  OPERATION:  Right LCS total knee replacement.  SURGEON:  John L. Dorothyann Gibbs, M.D.  ASSISTANT:  Jamelle Rushing, P.A.  ANESTHESIA:  General  PATHOLOGY:  The patient has tricompartmental arthritis.  The knee has a valgus appearance to it which is more visually valgus than actual valgus due to a large medial fat pad.  DESCRIPTION OF PROCEDURE:  Under general anesthesia, the right leg is prepared with Duraprep and draped as a sterile field.  A slightly curved medial parapatellar incision is made.  The patella is everted.  Multiple small vessels are cauterized.  The femur is sized at a large. Using the tibial guide a proximal tibial resection is carried out and then using the first femoral guide the intercondylar drill hole is made with a 10 mm flexion gap. Using the intramedullary guide a distal femoral cut is made.  I took more off the medial femoral condyle than lateral and 10 mm extension gap is obtained and balanced. Following this, a recessing guide is used. A lamina spreader is inserted. Remnants of the cruciate ligaments and menisci are resected as well as spurs removed from the back of the femoral condyles.  Following this, the tibia is sized and large.  A center peg hole is placed.  Little drill holes are placed in sclerotic bone medially.  Then trial reduction of a large tibia 10 mm rotating platform and large femur reveals excellent fit, alignment and stability.  Due to the significant softness of the bone encountered in the femur decision was made to cement all components and this was then done after first osteotomizing the  patella and cleaning all bony surfaces with pulse irrigation. Once all components were cemented in place and excess cement hardened, a minor synovectomy was carried out. Tourniquet was then let down after cement hardening and removal of excess around the edges of the prosthesis.  Multiple small vessels were cauterized.  The wound was then closed with #1 Tycron, 0 Vicryl, 2-0 Vicryl and skin clips.  Tourniquet time was 48 minutes.  Total surgical time approximately an hour and ten minutes. The patient tolerated the procedure well and returned to recovery in good condition. DD:  11/28/99 TD:  11/29/99 Job: [id number]

## 2010-07-08 NOTE — H&P (Signed)
Alison Doyle, Alison Doyle                  ACCOUNT NO.:  [id number]   MEDICAL RECORD NO.:  [id number]          PATIENT TYPE:  INP   LOCATION:  2854                         FACILITY:  [facility]   PHYSICIAN:  Marcellus Scott, MD     DATE OF BIRTH:  [birth year]   DATE OF PROCEDURE:  01/19/2006  DATE OF DISCHARGE:                    STAT - MUST CHANGE TO CORRECT WORK TYPE   Primary medical doctor: Dr. Renne Crigler.  Primary GI: Dr. Virginia Rochester.   CHIEF COMPLAINT:  Yellowish discoloration of the skin,sclerae and urine  and pruritus.   HISTORY OF PRESENT ILLNESS:  Alison Doyle, a pleasant 75 year old Caucasian  female with past medical history as indicated below was recently  admitted to the hospital with history of chest pain. Pulmonary embolism  and myocardial infarction were ruled out.  The patient was subsequently  discharged home.  During the admission, the patient was found to have an  elevated bilirubin of 3.2.  The patient does indicate that for past 4-6  weeks, she has been having intermittent pruritus and has lately also  noticed yellowish discoloration of the skin, which has been worsening  and yellowish discoloration of the urine and pruritus.  The patient,  however, said she has normal color stools, normal appetite. She does  claim that she has had 15 pounds weight loss over the last 3 weeks.  There is no history of any dysphagia or vomiting.  Following the last  discharge, the patient was evaluated by her primary care physician when  a repeat hepatic panel showed a bilirubin of 6+.  Then the patient was  evaluated by GI and had an ERCP done today, which shows gallstones in  the gallbladder but no evidence of stones in the common bile duct.  GI  has discussed the case with surgery with the aim of cholecystectomy and  we are asked to admit the patient.   PAST MEDICAL HISTORY:  1. Degenerative joint disease.  2. Colonic polyps.  3. Internal hemorrhoids.  4. Left renal cyst, which is to be followed up as  an outpatient.  5. Diabetes per the patient.  6. Bilateral corneal transplants with decreased vision.  7. Tobacco abuse.  8. Pulmonary nodules on CT to be followed up as outpatient.   PAST SURGICAL HISTORY:  1. Status post right total knee replacement.  2. Status post cesarean section.  3. Status post bilateral corneal transplant.   MEDICATIONS:  1. Aspirin 81 mg p.o. daily.  2. Tramadol 50 mg, 2 tablets p.o. q. 4 hours.   ALLERGIES:  MORPHINE, REGLAN SAID TO CAUSE RASH.  UNCLEAR WHICH ONE IT  IS. WHEN BOTH were TAKEN AT THE SAME TIME DURING THE PREVIOUS ADMISSION,  SHE BROKE OUT IN RASH.   FAMILY HISTORY:  Dad demised of laryngeal carcinoma.  Mom demised of  unclear etiology but past history of diabetes and breast cancer.   SOCIAL HISTORY:  The patient is widowed, retired, has 1 son who is at  the bedside at this time.  The patient lives alone.  Smokes 1/2 a pack  to 1 pack of cigarettes per day for years. No  history of alcohol or drug  abuse.   REVIEW OF SYSTEMS:  HEAD, EYE, ENT:  The patient denies any headache,  earache, sore throat, difficulty swallowing.  She does indicate the  yellowish discoloration of her eyes.  She has partial dentures.  She has  decreased vision in both eyes despite wearing glasses.  RESPIRATORY  SYSTEM:  The patient denies any cough, dyspnea, hoarseness, wheezing.  CARDIOVASCULAR:  The patient denies any chest pain, orthopnea, PND,  palpitations.  ABDOMEN:  GI per history of presenting illness.  Has  intermittent right sided abdominal pain but none at this time.  URINARY:  There is no history of dysuria, urinary frequency, hesitancy or  difficulty passing urine.  MUSCULOSKELETAL:  The patient denies any  joint pain, joint swellings or muscle pains.  SKIN:  The patient  describes a yellowish tinge of her skin and pruritus.  PSYCHIATRIC:  Noncontributory.   PHYSICAL EXAMINATION:  Moderately built and obese female patient in no  cardiopulmonary or  painful distress at this time.  VITAL SIGNS:  She is afebrile with a pulse of 75/min, regular;  respirations 20 per minute and blood pressure 131/75, saturating at 96-  98% on room air.  HEAD, EYE, ENT:  Normocephalic, atraumatic.  The patient's bilateral  pupils are round, symmetric and reactive to light and accommodation.  There is decreased visual acuity in both eyes.  She is bespectacled.  Oral cavity with a few missing teeth with dentures and the rest of the  teeth have some fillings.  There is no evidence of pharyngeal erythema.  Mucous are pink and moist and mild icterus present.  NECK:  No lymphadenopathy, JVD, carotid bruit or goiter.  RESPIRATORY:  Normal vesicular breath sounds bilaterally.  Clear to  auscultation.  BREAST:  Not done.  Not pertinent to current presentation.  CARDIOVASCULAR:  First and second heart sounds heard.  No third or  fourth heart sound.  No murmurs, rubs, gallops or clicks.  ABDOMEN:  Obese, mild tenderness in right middle quadrant to deep  palpation.  But no rigidity, rebound, guarding or organomegaly or masses  palpated.  Bowel sounds are normally heard.  EXTREMITIES:  The patient with a scar of her right knee surgery.  There  are no rashes.  No clubbing, cyanosis or edema.  Peripheral pulses are  felt symmetrically throughout.  CNS:  The patient is awake, alert and oriented x3.  No focal neurologic  deficit.  No asterixis.  SKIN:  With yellowish discoloration.   LABORATORY DATA:  There is no lab data obtained yet on the patient but  will obtain complete metabolic panel, CBC with diff, PT/PTT, direct and  indirect bilirubin.  Pertinent labs of previous admission include on  January 05, 2006, CBC:  Hemoglobin 14.5, hematocrit 42, white blood  cells 9.9 and MCV 85.7 with a platelet count of 953.  The patient's  metabolic panel, which was unremarkable except for glucose of 223, hepatic panel abnormal with total bilirubin of 3.1, ALT 146, total   protein of 6, albumin 3.2, calcium 8.8.  Hemoglobin A1c 6.5.  Amylase  and lipase within normal limits.  Cardiac enzymes x3 negative.  Homocysteine 7.3.  Abdominal Ultrasound, which was done November 16 with  impression:  Gallstones without ultrasound evidence of acute  cholecystitis.  Fatty liver.  Left renal cyst.   ASSESSMENT/PLAN:  1. Worsening jaundice, unclear etiology.  Questionable secondary to      gallstones disease/cholecystitis/hepatitis.  We will admit the  patient to the medical floor. Will obtain CBC's, complete metabolic      panel, PTT/ PT/INR, and a hepatitis profile.  To consider a CT of      the abdomen with contrast.  Surgery to evaluate the patient.      Patient to be on a low fat diet.  2. History of  diabetes.  CBGs to be monitored and the patient to be      given sliding scale insulin.  3. Deep vein thrombosis.  The patient to be on low molecular weight      heparin.  4. GI prophylaxis.  The patient will be placed on Protonix.  5. Chronic obstructive pulmonary disease, stable, p.r.n. nebs.  6. Left renal cyst and incidental pulmonary nodules on CT to be      followed as outpatient.  7. Tobacco abuse.  Tobacco cessation counseling.      Marcellus Scott, MD  Electronically Signed     AH/MEDQ  D:  01/19/2006  T:  01/19/2006  Job:  [id number]   cc:   Soyla Murphy. Renne Crigler, M.D.  Georgiana Spinner, M.D.

## 2010-07-08 NOTE — Op Note (Signed)
[name], [name]                  ACCOUNT NO.:  [id number]   MEDICAL RECORD NO.:  [id number]          PATIENT TYPE:  INP   LOCATION:  5733                         FACILITY:  [facility]   PHYSICIAN:  Georgiana Spinner, M.D.    DATE OF BIRTH:  1935/12/26   DATE OF PROCEDURE:  01/19/2006  DATE OF DISCHARGE:                               OPERATIVE REPORT   PROCEDURE:  Endoscopic retrograde cholangiopancreatography.   INDICATIONS:  Jaundice with suspected common bile duct stone.   ANESTHESIA:  Fentanyl 100 mcg, Versed 10 mg, glucagon 1 mL was given.   DESCRIPTION OF PROCEDURE:  With patient mildly sedated in the prone  position, the Olympus side-viewing duodenal scope was inserted through  the mouth, and passed under direct vision through the esophagus,  stomach, into the small intestine, and shortened to view the papilla of  Vater.  Once this was visualized; there appeared to be a periampullary  diverticulum, but we were then able to cannulate, freely, the common  bile duct.  We passed a guidewire to hold this position; and  subsequently injected contrast material.  Gallstones were seen in the  gallbladder, but no filling defects were noted by me in the extrahepatic  ductal system; and, therefore, at that point I elected to terminate the  procedure.  The guidewire was removed; the catheter was removed; the  endoscope was withdrawn.  The patient's vital signs and pulse oximeter  remained stable.  The patient tolerated the procedure well without  apparent complication.   FINDINGS:  Gallstones seen in the gallbladder, but no other extrahepatic  filling defects noted.   PLAN:  We will refer patient to surgery.           ______________________________  Georgiana Spinner, M.D.     GMO/MEDQ  D:  01/19/2006  T:  01/20/2006  Job:  [id number]

## 2010-07-08 NOTE — Op Note (Signed)
NAMEHARLEI, Alison Doyle                            ACCOUNT NO.:  [id number]   MEDICAL RECORD NO.:  [id number]                   PATIENT TYPE:  AMB   LOCATION:  ENDO                                 FACILITY:  [facility]   PHYSICIAN:  Georgiana Spinner, M.D.                 DATE OF BIRTH:  05/10/1935   DATE OF PROCEDURE:  01/26/2003  DATE OF DISCHARGE:                                 OPERATIVE REPORT   PROCEDURE:  Colonoscopy, polypectomy, biopsy, prolonged procedure.   ANESTHESIA:  Demerol 120 mg, Versed 11 mg.   DESCRIPTION OF PROCEDURE:  With the patient mildly sedated in the left  lateral decubitus position, the Olympus videoscopic colonoscope was inserted  in the rectum and passed under direct vision to the cecum, identified by the  ileocecal valve and appendiceal orifice.  In the cecum were some polyps that  were seen and photographed, some removed with snare cautery technique, the  other with hot biopsy forceps technique.  Once accomplished the colonoscope  was then slowly withdrawn, taking circumferential views of the remaining  colonic mucosa, stopping in the following places:  First at the ileocecal  valve, which appeared quite prominent and somewhat erythematous, which was  biopsied and photographed.  There were numerous other polyps that we saw in  the ascending colon, where there was also a large lipoma, which was  photographed in the ascending colon.  There were large polyps also seen in  the splenic flexure, descending colon, and sigmoid colon, which were removed  with either hot biopsy forceps technique or snare cautery technique.  There  was good hemostasis throughout and at 15 cm from the anal verge, we saw a  mass which was fairly complex in its structure and somewhat hard on feel  with hot biopsy forceps and multiple biopsies were taken.  The endoscope was  then withdrawn further all the way to the rectum and a polyp was seen in the  rectum, which was removed using again snare  cautery technique, again a  setting of 20/200 blended current with the ERBE argon photocoagulator, as  were the other polyps.   ASSESSMENT:  Multiple polyposis throughout the colon with a question of a  mass at 15 cm from the anal verge.   Will await biopsy reports.  The patient will call me for results and follow  up with me as an outpatient.  Will need repeat examination in one year.                                               Georgiana Spinner, M.D.    GMO/MEDQ  D:  01/26/2003  T:  01/26/2003  Job:  [id number]

## 2011-07-09 ENCOUNTER — Emergency Department (HOSPITAL_COMMUNITY)
Admission: EM | Admit: 2011-07-09 | Discharge: 2011-07-09 | Disposition: A | Discharge status: Home / Self Care | Payer: Medicare Other | Attending: Emergency Medicine | Admitting: Emergency Medicine

## 2011-07-09 ENCOUNTER — Emergency Department (HOSPITAL_COMMUNITY): Payer: Medicare Other

## 2011-07-09 ENCOUNTER — Encounter (HOSPITAL_COMMUNITY): Payer: Self-pay | Admitting: *Deleted

## 2011-07-09 DIAGNOSIS — R0602 Shortness of breath: Secondary | ICD-10-CM | POA: Insufficient documentation

## 2011-07-09 DIAGNOSIS — F17200 Nicotine dependence, unspecified, uncomplicated: Secondary | ICD-10-CM | POA: Insufficient documentation

## 2011-07-09 DIAGNOSIS — Z7982 Long term (current) use of aspirin: Secondary | ICD-10-CM | POA: Insufficient documentation

## 2011-07-09 DIAGNOSIS — J40 Bronchitis, not specified as acute or chronic: Secondary | ICD-10-CM

## 2011-07-09 DIAGNOSIS — R079 Chest pain, unspecified: Secondary | ICD-10-CM | POA: Insufficient documentation

## 2011-07-09 DIAGNOSIS — R110 Nausea: Secondary | ICD-10-CM | POA: Insufficient documentation

## 2011-07-09 DIAGNOSIS — E119 Type 2 diabetes mellitus without complications: Secondary | ICD-10-CM | POA: Insufficient documentation

## 2011-07-09 DIAGNOSIS — Z79899 Other long term (current) drug therapy: Secondary | ICD-10-CM | POA: Insufficient documentation

## 2011-07-09 NOTE — ED Notes (Signed)
Patient states that she has been coughing and having congestion since Sunday. States that she is coughing up green sputum. Denies shortness of breath. Also c/o pain in ribs and under her breasts.

## 2011-07-09 NOTE — Discharge Instructions (Signed)

## 2011-07-09 NOTE — ED Notes (Signed)
ZOX:[id number]<AV> Expected date:07/09/11<BR> Expected time:12:30 AM<BR> Means of arrival:Ambulance<BR> Comments:<BR> Cough, tachy

## 2011-07-09 NOTE — ED Provider Notes (Addendum)
History     CSN: [id number]  Arrival date & time 07/09/11  0039   First MD Initiated Contact with Patient 07/09/11 0110      Chief Complaint  Patient presents with  . Shortness of Breath  . Nausea    (Consider location/radiation/quality/duration/timing/severity/associated sxs/prior treatment) HPI Comments: Patient presents with one week of increased cough productive of green sputum.  She notes that she has chest pain associated with coughing.  No fevers.  Some mild shortness of breath with coughing.  She does state that she smokes approximately 4-5 cigarettes per day and used to smoke more.  She does not have an official diagnosis of COPD or emphysema.  Patient does not use any inhalers.  She otherwise states that she feels well.  She was concerned about pneumonia which is why she came in today.  Patient is a 76 y.o. female presenting with shortness of breath and cough. The history is provided by the patient.  Shortness of Breath  Associated symptoms include chest pain, cough and shortness of breath. Pertinent negatives include no fever.  Cough This is a new problem. Associated symptoms include chest pain and shortness of breath. Pertinent negatives include no chills, no headaches and no eye redness.    Past Medical History  Diagnosis Date  . Diabetes mellitus     History reviewed. No pertinent past surgical history.  History reviewed. No pertinent family history.  History  Substance Use Topics  . Smoking status: Not on file  . Smokeless tobacco: Not on file  . Alcohol Use: No    OB History    Grav Para Term Preterm Abortions TAB SAB Ect Mult Living                  Review of Systems  Constitutional: Negative.  Negative for fever and chills.  HENT: Negative.   Eyes: Negative.  Negative for discharge and redness.  Respiratory: Positive for cough and shortness of breath.   Cardiovascular: Positive for chest pain.  Gastrointestinal: Negative.  Negative for nausea,  vomiting, abdominal pain and diarrhea.  Genitourinary: Negative.  Negative for dysuria and vaginal discharge.  Musculoskeletal: Negative.  Negative for back pain.  Skin: Negative.  Negative for color change and rash.  Neurological: Negative.  Negative for syncope and headaches.  Hematological: Negative.  Negative for adenopathy.  Psychiatric/Behavioral: Negative.  Negative for confusion.  All other systems reviewed and are negative.    Allergies  Review of patient's allergies indicates no known allergies.  Home Medications   Current Outpatient Rx  Name Route Sig Dispense Refill  . ASPIRIN EC 81 MG PO TBEC Oral Take 81 mg by mouth daily.    Marland Kitchen CALCIUM CARBONATE 600 MG PO TABS Oral Take 600 mg by mouth daily.    Marland Kitchen METFORMIN HCL 850 MG PO TABS Oral Take 850 mg by mouth 2 (two) times daily with a meal.    . TRAMADOL HCL 50 MG PO TABS Oral Take 50 mg by mouth every 6 (six) hours as needed. For pain      BP 138/76  Pulse 91  Temp(Src) 98.8 F (37.1 C) (Oral)  Resp 20  Ht 5\' 8"  (1.727 m)  Wt 235 lb (106.595 kg)  BMI 35.73 kg/m2  SpO2 100%  Physical Exam  Nursing note and vitals reviewed. Constitutional: She is oriented to person, place, and time. She appears well-developed and well-nourished.  Non-toxic appearance. She does not have a sickly appearance.  HENT:  Head: Normocephalic and  atraumatic.  Eyes: Conjunctivae, EOM and lids are normal. Pupils are equal, round, and reactive to light. No scleral icterus.  Neck: Trachea normal and normal range of motion. Neck supple.  Cardiovascular: Normal rate, regular rhythm and normal heart sounds.   Pulmonary/Chest: Effort normal and breath sounds normal. No respiratory distress. She has no wheezes. She has no rales. She exhibits no tenderness.  Abdominal: Soft. Normal appearance. There is no tenderness. There is no rebound, no guarding and no CVA tenderness.  Musculoskeletal: Normal range of motion.  Neurological: She is alert and  oriented to person, place, and time. She has normal strength.  Skin: Skin is warm, dry and intact. No rash noted.  Psychiatric: She has a normal mood and affect. Her behavior is normal. Judgment and thought content normal.    ED Course  Procedures (including critical care time)  Dg Chest 2 View  07/09/2011  *RADIOLOGY REPORT*  Clinical Data: Productive cough.  Shortness of breath.  CHEST - 2 VIEW  Comparison: 07/15/2008  Findings: The lungs are clear without focal infiltrate, edema, pneumothorax or pleural effusion. Interstitial markings are diffusely coarsened with chronic features. The cardiopericardial silhouette is within normal limits for size. Imaged bony structures of the thorax are intact.  IMPRESSION: No acute cardiopulmonary findings.  Original Report Authenticated By: ERIC A. MANSELL, M.D.      MDM  Patient with productive cough at home but no signs of respiratory distress at this time.  She is not wheezing to require nebulizer treatments.  I will obtain a chest x-ray to look for pneumonia.        Nat Christen, MD 07/09/11 0120  Patient with normal chest x-ray so I do not feel she needs antibiotics as her coughing is likely related to a viral bronchitis.  She can followup with her primary care physician later this week as needed.  Nat Christen, MD 07/09/11 [phone]

## 2011-07-09 NOTE — ED Notes (Signed)
Patient transported to X-ray 

## 2014-07-08 ENCOUNTER — Other Ambulatory Visit: Payer: Self-pay | Admitting: Registered Nurse

## 2014-11-29 ENCOUNTER — Emergency Department (HOSPITAL_COMMUNITY)
Admission: EM | Admit: 2014-11-29 | Discharge: 2014-11-29 | Disposition: A | Discharge status: Home / Self Care | Payer: Medicare Other | Attending: Emergency Medicine | Admitting: Emergency Medicine

## 2014-11-29 ENCOUNTER — Encounter (HOSPITAL_COMMUNITY): Payer: Self-pay

## 2014-11-29 ENCOUNTER — Emergency Department (HOSPITAL_COMMUNITY): Payer: Medicare Other

## 2014-11-29 DIAGNOSIS — Z720 Tobacco use: Secondary | ICD-10-CM | POA: Insufficient documentation

## 2014-11-29 DIAGNOSIS — E119 Type 2 diabetes mellitus without complications: Secondary | ICD-10-CM | POA: Insufficient documentation

## 2014-11-29 DIAGNOSIS — Y998 Other external cause status: Secondary | ICD-10-CM | POA: Insufficient documentation

## 2014-11-29 DIAGNOSIS — Y9289 Other specified places as the place of occurrence of the external cause: Secondary | ICD-10-CM | POA: Insufficient documentation

## 2014-11-29 DIAGNOSIS — S8992XA Unspecified injury of left lower leg, initial encounter: Secondary | ICD-10-CM | POA: Diagnosis present

## 2014-11-29 DIAGNOSIS — Y9389 Activity, other specified: Secondary | ICD-10-CM | POA: Insufficient documentation

## 2014-11-29 DIAGNOSIS — S8002XA Contusion of left knee, initial encounter: Secondary | ICD-10-CM

## 2014-11-29 DIAGNOSIS — N390 Urinary tract infection, site not specified: Secondary | ICD-10-CM | POA: Diagnosis not present

## 2014-11-29 DIAGNOSIS — Z79899 Other long term (current) drug therapy: Secondary | ICD-10-CM | POA: Diagnosis not present

## 2014-11-29 DIAGNOSIS — Z7982 Long term (current) use of aspirin: Secondary | ICD-10-CM | POA: Insufficient documentation

## 2014-11-29 DIAGNOSIS — W19XXXA Unspecified fall, initial encounter: Secondary | ICD-10-CM

## 2014-11-29 DIAGNOSIS — W1839XA Other fall on same level, initial encounter: Secondary | ICD-10-CM | POA: Diagnosis not present

## 2014-11-29 LAB — I-STAT CHEM 8, ED
BUN: 11 mg/dL (ref 6–20)
CALCIUM ION: 1.07 mmol/L — AB (ref 1.13–1.30)
Chloride: 103 mmol/L (ref 101–111)
Creatinine, Ser: 0.7 mg/dL (ref 0.44–1.00)
Glucose, Bld: 154 mg/dL — ABNORMAL HIGH (ref 65–99)
HEMATOCRIT: 50 % — AB (ref 36.0–46.0)
HEMOGLOBIN: 17 g/dL — AB (ref 12.0–15.0)
Potassium: 3.3 mmol/L — ABNORMAL LOW (ref 3.5–5.1)
SODIUM: 138 mmol/L (ref 135–145)
TCO2: 21 mmol/L (ref 0–100)

## 2014-11-29 LAB — CBC
HCT: 47.4 % — ABNORMAL HIGH (ref 36.0–46.0)
HEMOGLOBIN: 15.4 g/dL — AB (ref 12.0–15.0)
MCH: 28.5 pg (ref 26.0–34.0)
MCHC: 32.5 g/dL (ref 30.0–36.0)
MCV: 87.6 fL (ref 78.0–100.0)
PLATELETS: 188 10*3/uL (ref 150–400)
RBC: 5.41 MIL/uL — AB (ref 3.87–5.11)
RDW: 14.9 % (ref 11.5–15.5)
WBC: 9.5 10*3/uL (ref 4.0–10.5)

## 2014-11-29 LAB — URINE MICROSCOPIC-ADD ON

## 2014-11-29 LAB — CBG MONITORING, ED: GLUCOSE-CAPILLARY: 148 mg/dL — AB (ref 65–99)

## 2014-11-29 LAB — URINALYSIS, ROUTINE W REFLEX MICROSCOPIC
BILIRUBIN URINE: NEGATIVE
GLUCOSE, UA: NEGATIVE mg/dL
KETONES UR: 15 mg/dL — AB
Nitrite: POSITIVE — AB
PH: 6 (ref 5.0–8.0)
Protein, ur: NEGATIVE mg/dL
SPECIFIC GRAVITY, URINE: 1.025 (ref 1.005–1.030)
Urobilinogen, UA: 0.2 mg/dL (ref 0.0–1.0)

## 2014-11-29 LAB — PROTIME-INR
INR: 1.1 (ref 0.00–1.49)
PROTHROMBIN TIME: 14.4 s (ref 11.6–15.2)

## 2014-11-29 MED ORDER — SULFAMETHOXAZOLE-TRIMETHOPRIM 800-160 MG PO TABS
1.0000 | ORAL_TABLET | Freq: Once | ORAL | Status: AC
  Administered 2014-11-29: 1 via ORAL
  Filled 2014-11-29: qty 1

## 2014-11-29 MED ORDER — NAPROXEN 500 MG PO TABS: 500.0000 mg | ORAL_TABLET | Freq: Two times a day (BID) | ORAL | Status: DC

## 2014-11-29 MED ORDER — SULFAMETHOXAZOLE-TRIMETHOPRIM 800-160 MG PO TABS: 1.0000 | ORAL_TABLET | Freq: Two times a day (BID) | ORAL | Status: AC

## 2014-11-29 MED ORDER — SODIUM CHLORIDE 0.9 % IV BOLUS (SEPSIS)
1000.0000 mL | Freq: Once | INTRAVENOUS | Status: AC
  Administered 2014-11-29: 1000 mL via INTRAVENOUS

## 2014-11-29 MED ORDER — FENTANYL CITRATE (PF) 100 MCG/2ML IJ SOLN
50.0000 ug | Freq: Once | INTRAMUSCULAR | Status: AC
  Administered 2014-11-29: 50 ug via INTRAVENOUS
  Filled 2014-11-29: qty 2

## 2014-11-29 NOTE — ED Notes (Signed)
Patient ambulated in hallway with knee immobilizer applied. 2X assist.

## 2014-11-29 NOTE — ED Notes (Signed)
Pt endured mechanical fall last night. She was walking at home when her knees "gave out" and landed on both of them. This morning she fell again and has increased pain to left knee and it appears swollen and bruised. No obvious defmority noted. Hx of right knee replacement 15 years ago. No LOC, no head injury. A&OX4. BP- 138/90, HR-110.

## 2014-11-29 NOTE — ED Notes (Signed)
Discharge instructions and prescriptions reviewed - voiced undersrtanding

## 2014-11-29 NOTE — ED Notes (Signed)
Patient transported to X-ray 

## 2014-11-29 NOTE — ED Provider Notes (Signed)
CSN: [id number]     Arrival date & time 11/29/14  1721 History   First MD Initiated Contact with Patient 11/29/14 1731     Chief Complaint  Patient presents with  . Knee Pain  . Fall     (Consider location/radiation/quality/duration/timing/severity/associated sxs/prior Treatment) HPI  The patient is a 79 year old female, prior history of diabetes and right total knee replacement in 2001 who presents after having 2 distinct and separate falls, one yesterday and one again this morning about 12 hours ago. The patient states that she fell just before 6:00 this morning, she was unable to get up and eventually was able to call her neighbor to come and help her. She has been unable to ambulate since that time. She complains of severe left sided knee pain with swelling. She states that she fell because her "knees buckled". This does happen occasionally, she denies hitting her head, she fell straight to her knees and was unable to get up. She denies back pain, headache, neck pain, numbness or weakness. She denies taking blood thinners. Symptoms are persistent, severe, worse with range of motion of the knee  Past Medical History  Diagnosis Date  . Diabetes mellitus    Past Surgical History  Procedure Laterality Date  . Replacement total knee Right 2001   No family history on file. Social History  Substance Use Topics  . Smoking status: Current Every Day Smoker  . Smokeless tobacco: None  . Alcohol Use: No   OB History    No data available     Review of Systems  All other systems reviewed and are negative.     Allergies  Review of patient's allergies indicates no known allergies.  Home Medications   Prior to Admission medications   Medication Sig Start Date End Date Taking? Authorizing Provider  aspirin EC 81 MG tablet Take 81 mg by mouth daily.   Yes Historical Provider, MD  hydrOXYzine (ATARAX/VISTARIL) 25 MG tablet Take 25 mg by mouth 3 (three) times daily as needed for  anxiety.   Yes Historical Provider, MD  metFORMIN (GLUCOPHAGE) 850 MG tablet Take 850 mg by mouth 2 (two) times daily with a meal.   Yes Historical Provider, MD  pravastatin (PRAVACHOL) 20 MG tablet Take 20 mg by mouth at bedtime.   Yes Historical Provider, MD  traMADol (ULTRAM) 50 MG tablet Take 50 mg by mouth every 6 (six) hours as needed. For pain   Yes Historical Provider, MD  naproxen (NAPROSYN) 500 MG tablet Take 1 tablet (500 mg total) by mouth 2 (two) times daily with a meal. 11/29/14   Eber Hong, MD  sulfamethoxazole-trimethoprim (BACTRIM DS,SEPTRA DS) 800-160 MG tablet Take 1 tablet by mouth 2 (two) times daily. 11/29/14 12/06/14  Eber Hong, MD   BP 105/61 mmHg  Pulse 93  Temp(Src) 98.2 F (36.8 C) (Oral)  Resp 16  Ht  (1.676 m)  Wt 195 lb (88.451 kg)  BMI 31.49 kg/m2  SpO2 95% Physical Exam  Constitutional: She appears well-developed and well-nourished. No distress.  HENT:  Head: Normocephalic and atraumatic.  Mouth/Throat: Oropharynx is clear and moist. No oropharyngeal exudate.  No malocclusion, no hemotympanum, no battle sign, no raccoon eyes  Eyes: Conjunctivae and EOM are normal. Pupils are equal, round, and reactive to light. Right eye exhibits no discharge. Left eye exhibits no discharge. No scleral icterus.  Neck: Normal range of motion. Neck supple. No JVD present. No thyromegaly present.  Cardiovascular: Normal rate, regular rhythm, normal heart  sounds and intact distal pulses.  Exam reveals no gallop and no friction rub.   No murmur heard. Pulmonary/Chest: Effort normal and breath sounds normal. No respiratory distress. She has no wheezes. She has no rales.  Abdominal: Soft. Bowel sounds are normal. She exhibits no distension and no mass. There is no tenderness.  Musculoskeletal: She exhibits edema. She exhibits no tenderness.  Able to straight leg raise on the right, knee has normal alignment, slight valgus deformity, no crepitus with range of motion,  tenderness with range of motion is mild. Left knee unable to straight leg raise, joint effusion present, warm, swollen, severe decreased range of motion secondary to pain, normal pulses at the foot, internal and external rotation of the hips bilaterally without pain, normal range of motion of the bilateral upper extremities with soft compartments and supple joints, no spinal tenderness  Lymphadenopathy:    She has no cervical adenopathy.  Neurological: She is alert. Coordination normal.  Skin: Skin is warm and dry. No rash noted. No erythema.  Psychiatric: She has a normal mood and affect. Her behavior is normal.  Nursing note and vitals reviewed.   ED Course  Procedures (including critical care time) Labs Review Labs Reviewed  CBC - Abnormal; Notable for the following:    RBC 5.41 (*)    Hemoglobin 15.4 (*)    HCT 47.4 (*)    All other components within normal limits  URINALYSIS, ROUTINE W REFLEX MICROSCOPIC (NOT AT [facility]) - Abnormal; Notable for the following:    APPearance CLOUDY (*)    Hgb urine dipstick SMALL (*)    Ketones, ur 15 (*)    Nitrite POSITIVE (*)    Leukocytes, UA MODERATE (*)    All other components within normal limits  URINE MICROSCOPIC-ADD ON - Abnormal; Notable for the following:    Squamous Epithelial / LPF MANY (*)    Bacteria, UA MANY (*)    All other components within normal limits  I-STAT CHEM 8, ED - Abnormal; Notable for the following:    Potassium 3.3 (*)    Glucose, Bld 154 (*)    Calcium, Ion 1.07 (*)    Hemoglobin 17.0 (*)    HCT 50.0 (*)    All other components within normal limits  URINE CULTURE  PROTIME-INR  CBG MONITORING, ED    Imaging Review Dg Knee Complete 4 Views Left  11/29/2014   CLINICAL DATA:  79 year old female who fell at home. Pain and swelling. Initial encounter.  EXAM: LEFT KNEE - COMPLETE 4+ VIEW  COMPARISON:  None.  FINDINGS: Soft tissue swelling. Osteopenia and advanced degenerative changes about the left knee. The  lateral view is oblique. No definite joint effusion. Severe joint space loss and osteophytosis, maximal at the lateral compartment. No depression of the tibial plateau identified. No definite acute fracture or dislocation identified. There is a 2.5 cm sclerotic medullary lesion in the proximal left tibia shaft which has a narrow zone of transition and benign appearance such as enchondroma.  IMPRESSION: Osteopenia and advanced degenerative changes. No definite acute fracture or dislocation identified about the left knee.   Electronically Signed   By: Odessa Fleming M.D.   On: 11/29/2014 19:36   Dg Knee Complete 4 Views Right  11/29/2014   CLINICAL DATA:  79 year old female with history of trauma from a fall with injury to the left knee complaining of left knee pain. Prior history of right knee surgery.  EXAM: RIGHT KNEE - COMPLETE 4+ VIEW  COMPARISON:  No priors.  FINDINGS: Diffuse osteopenia. Status post total knee arthroplasty. The femoral, patellar and tibial prosthesis appear properly seated without periprosthetic fracture or other acute abnormality. No acute displaced fracture or dislocation. Numerous vascular calcifications are noted. Soft tissues are otherwise unremarkable.  IMPRESSION: 1. Negative for acute trauma to the right knee. 2. Status post right total knee arthroplasty. 3. Osteopenia. 4. Atherosclerosis.   Electronically Signed   By: Trudie Reed M.D.   On: 11/29/2014 19:36   I have personally reviewed and evaluated these images and lab results as part of my medical decision-making.   MDM   Final diagnoses:  Fall, initial encounter  Contusion, knee, left, initial encounter  UTI (lower urinary tract infection)    The patient appears to have had 2 mechanical falls, she does not have any focal neurologic symptoms, her range of motion is limited by severe pain in the left knee with joint effusion, consider fracture, imaging ordered, nothing by mouth, pain medication, preop labs, vital signs  otherwise unremarkable, she is not tachycardic on my exam.  UTI likely, other labs unremarkable.  The patient refuses to be admitted to the hospital, she wants to go home, I have had a discussion with her regarding the indications for admission including difficulty ambulating. She has been able to get up out of bed with assistance, she has been able to ambulate but does need assistance, she states that she has a walker, she declines admission and is insistent on going home. We'll provide her with antibiotics, pain medications, I have encouraged her to return should her symptoms worsen.  She expresses her understanding to the indications for return.  No fractures, no sig abnormal labs.  Meds given in ED:  Medications  sulfamethoxazole-trimethoprim (BACTRIM DS,SEPTRA DS) 800-160 MG per tablet 1 tablet (not administered)  fentaNYL (SUBLIMAZE) injection 50 mcg (50 mcg Intravenous Given 11/29/14 1852)  sodium chloride 0.9 % bolus 1,000 mL (1,000 mLs Intravenous New Bag/Given 11/29/14 2020)    New Prescriptions   NAPROXEN (NAPROSYN) 500 MG TABLET    Take 1 tablet (500 mg total) by mouth 2 (two) times daily with a meal.   SULFAMETHOXAZOLE-TRIMETHOPRIM (BACTRIM DS,SEPTRA DS) 800-160 MG TABLET    Take 1 tablet by mouth 2 (two) times daily.      Eber Hong, MD 11/29/14 2300

## 2014-11-29 NOTE — Discharge Instructions (Signed)
Your xrays show no fractures - you do have some bruising / bleedign in your knee but this weill go away with time.  I have offered you admission to the hospital but you have declined and want to go home - you may return at any time for reevaluation.  Please obtain all of your results from medical records or have your doctors office obtain the results - share them with your doctor - you should be seen at your doctors office in the next 2 days. Call today to arrange your follow up. Take the medications as prescribed. Please review all of the medicines and only take them if you do not have an allergy to them. Please be aware that if you are taking birth control pills, taking other prescriptions, ESPECIALLY ANTIBIOTICS may make the birth control ineffective - if this is the case, either do not engage in sexual activity or use alternative methods of birth control such as condoms until you have finished the medicine and your family doctor says it is OK to restart them. If you are on a blood thinner such as COUMADIN, be aware that any other medicine that you take may cause the coumadin to either work too much, or not enough - you should have your coumadin level rechecked in next 7 days if this is the case.  ?  It is also a possibility that you have an allergic reaction to any of the medicines that you have been prescribed - Everybody reacts differently to medications and while MOST people have no trouble with most medicines, you may have a reaction such as nausea, vomiting, rash, swelling, shortness of breath. If this is the case, please stop taking the medicine immediately and contact your physician.  ?  You should return to the ER if you develop severe or worsening symptoms.

## 2014-12-01 LAB — URINE CULTURE: Culture: >=100000

## 2014-12-02 ENCOUNTER — Telehealth (HOSPITAL_BASED_OUTPATIENT_CLINIC_OR_DEPARTMENT_OTHER): Payer: Self-pay | Admitting: Emergency Medicine

## 2014-12-02 NOTE — Progress Notes (Signed)
ED Antimicrobial Stewardship Positive Culture Follow Up   Alison Doyle is an 79 y.o. female who [facility] with a chief complaint of  Chief Complaint  Patient presents with  . Knee Pain  . Fall    Recent Results (from the past 720 hour(s))  Urine culture     Status: None   Collection Time: 11/29/14  8:49 PM  Result Value Ref Range Status   Specimen Description URINE, RANDOM  Final   Special Requests NONE  Final   Culture >=100,000 COLONIES/mL ESCHERICHIA COLI  Final   Report Status 12/01/2014 FINAL  Final   Organism ID, Bacteria ESCHERICHIA COLI  Final      Susceptibility   Escherichia coli - MIC*    AMPICILLIN >=32 RESISTANT Resistant     CEFAZOLIN <=4 SENSITIVE Sensitive     CEFTRIAXONE <=1 SENSITIVE Sensitive     CIPROFLOXACIN >=4 RESISTANT Resistant     GENTAMICIN <=1 SENSITIVE Sensitive     IMIPENEM <=0.25 SENSITIVE Sensitive     NITROFURANTOIN <=16 SENSITIVE Sensitive     TRIMETH/SULFA >=320 RESISTANT Resistant     AMPICILLIN/SULBACTAM 16 INTERMEDIATE Intermediate     PIP/TAZO <=4 SENSITIVE Sensitive     * >=100,000 COLONIES/mL ESCHERICHIA COLI    [x]  Treated with Bactrim, organism resistant to prescribed antimicrobial  ED Provider: Betsy CoderWill Dansie, Alison Doyle  79 y/o F presents with consecutive falls in one day. Endorses knee pain and swelling after fall. No apparent UTI symptoms other than potential confusion causing fall. Pt discharged with Bactrim prescription. Urine cx grew e coli resistant to Bactrim from a dirty UA. Plan to discontinue Bactrim and call pt for symptom check.   If patient asymptomatic --> NO TREATMENT If patient symptomatic --> Keflex po 500 mg BID x 7 days #14 No Refills  Alison Doyle, PharmD Pharmacy Resident Pager: [phone] 12/02/2014, 9:02 AM Infectious Diseases Pharmacist Phone# [phone]

## 2014-12-02 NOTE — Telephone Encounter (Signed)
Post ED Visit - Positive Culture Follow-up: Successful Patient Follow-Up  Culture assessed and recommendations reviewed by: []  Celedonio MiyamotoJeremy Frens, Pharm.D., BCPS-AQ ID []  Georgina PillionElizabeth Martin, Pharm.D., BCPS []  Santa RosaMinh Pham, VermontPharm.D., BCPS, AAHIVP []  Estella HuskMichelle Turner, Pharm.D., BCPS, AAHIVP []  Iyanbitoristy Reyes, [address] BoulevardPharm.D. []  Tennis Mustassie Stewart, Pharm.D. Lisette GrinderAlyson Leonard PharmD  Positive urine culture E. Coli  []  Patient discharged without antimicrobial prescription and treatment is now indicated [x]  Organism is resistant to prescribed ED discharge antimicrobial []  Patient with positive blood cultures  Changes discussed with ED provider: Will Marijo Fileansie PharmD New antibiotic prescription Stop bactrim, no symptoms therefore no further rx needed Called to n/a  Contacted patient, 12/02/14 1425   Berle MullMiller, Aadith Raudenbush 12/02/2014, 2:24 PM

## 2016-07-14 DIAGNOSIS — E1165 Type 2 diabetes mellitus with hyperglycemia: Secondary | ICD-10-CM | POA: Diagnosis not present

## 2016-07-14 DIAGNOSIS — I10 Essential (primary) hypertension: Secondary | ICD-10-CM | POA: Diagnosis not present

## 2016-07-14 DIAGNOSIS — E7800 Pure hypercholesterolemia, unspecified: Secondary | ICD-10-CM | POA: Diagnosis not present

## 2016-07-14 DIAGNOSIS — Z1321 Encounter for screening for nutritional disorder: Secondary | ICD-10-CM | POA: Diagnosis not present

## 2016-07-24 DIAGNOSIS — N390 Urinary tract infection, site not specified: Secondary | ICD-10-CM | POA: Diagnosis not present

## 2016-07-24 DIAGNOSIS — M25562 Pain in left knee: Secondary | ICD-10-CM | POA: Diagnosis not present

## 2016-07-24 DIAGNOSIS — I10 Essential (primary) hypertension: Secondary | ICD-10-CM | POA: Diagnosis not present

## 2016-07-24 DIAGNOSIS — E1165 Type 2 diabetes mellitus with hyperglycemia: Secondary | ICD-10-CM | POA: Diagnosis not present

## 2016-07-24 DIAGNOSIS — Z0001 Encounter for general adult medical examination with abnormal findings: Secondary | ICD-10-CM | POA: Diagnosis not present

## 2016-07-24 DIAGNOSIS — M810 Age-related osteoporosis without current pathological fracture: Secondary | ICD-10-CM | POA: Diagnosis not present

## 2016-08-28 DIAGNOSIS — E119 Type 2 diabetes mellitus without complications: Secondary | ICD-10-CM | POA: Diagnosis not present

## 2016-08-31 ENCOUNTER — Emergency Department (HOSPITAL_COMMUNITY)
Admission: EM | Admit: 2016-08-31 | Discharge: 2016-08-31 | Disposition: A | Discharge status: Home / Self Care | Payer: Medicare Other | Attending: Emergency Medicine | Admitting: Emergency Medicine

## 2016-08-31 ENCOUNTER — Emergency Department (HOSPITAL_COMMUNITY): Payer: Medicare Other

## 2016-08-31 ENCOUNTER — Encounter (HOSPITAL_COMMUNITY): Payer: Self-pay

## 2016-08-31 DIAGNOSIS — M5441 Lumbago with sciatica, right side: Secondary | ICD-10-CM | POA: Diagnosis not present

## 2016-08-31 DIAGNOSIS — Z7984 Long term (current) use of oral hypoglycemic drugs: Secondary | ICD-10-CM | POA: Diagnosis not present

## 2016-08-31 DIAGNOSIS — M25551 Pain in right hip: Secondary | ICD-10-CM | POA: Insufficient documentation

## 2016-08-31 DIAGNOSIS — Z79899 Other long term (current) drug therapy: Secondary | ICD-10-CM | POA: Insufficient documentation

## 2016-08-31 DIAGNOSIS — E119 Type 2 diabetes mellitus without complications: Secondary | ICD-10-CM | POA: Insufficient documentation

## 2016-08-31 DIAGNOSIS — N3000 Acute cystitis without hematuria: Secondary | ICD-10-CM | POA: Insufficient documentation

## 2016-08-31 DIAGNOSIS — Z7982 Long term (current) use of aspirin: Secondary | ICD-10-CM | POA: Insufficient documentation

## 2016-08-31 DIAGNOSIS — F17200 Nicotine dependence, unspecified, uncomplicated: Secondary | ICD-10-CM | POA: Insufficient documentation

## 2016-08-31 DIAGNOSIS — M5136 Other intervertebral disc degeneration, lumbar region: Secondary | ICD-10-CM | POA: Diagnosis not present

## 2016-08-31 DIAGNOSIS — R52 Pain, unspecified: Secondary | ICD-10-CM | POA: Diagnosis not present

## 2016-08-31 LAB — COMPREHENSIVE METABOLIC PANEL
ALBUMIN: 2.9 g/dL — AB (ref 3.5–5.0)
ALK PHOS: 96 U/L (ref 38–126)
ALT: 9 U/L — AB (ref 14–54)
AST: 18 U/L (ref 15–41)
Anion gap: 8 (ref 5–15)
BILIRUBIN TOTAL: 0.4 mg/dL (ref 0.3–1.2)
BUN: 11 mg/dL (ref 6–20)
CALCIUM: 8.4 mg/dL — AB (ref 8.9–10.3)
CO2: 24 mmol/L (ref 22–32)
CREATININE: 0.77 mg/dL (ref 0.44–1.00)
Chloride: 106 mmol/L (ref 101–111)
GFR calc Af Amer: >60 mL/min (ref >60)
GFR calc non Af Amer: >60 mL/min (ref >60)
GLUCOSE: 211 mg/dL — AB (ref 65–99)
Potassium: 3.6 mmol/L (ref 3.5–5.1)
Sodium: 138 mmol/L (ref 135–145)
TOTAL PROTEIN: 5.4 g/dL — AB (ref 6.5–8.1)

## 2016-08-31 LAB — URINALYSIS, ROUTINE W REFLEX MICROSCOPIC
BILIRUBIN URINE: NEGATIVE
GLUCOSE, UA: 50 mg/dL — AB
HGB URINE DIPSTICK: NEGATIVE
Ketones, ur: NEGATIVE mg/dL
LEUKOCYTES UA: NEGATIVE
NITRITE: POSITIVE — AB
Protein, ur: NEGATIVE mg/dL
Specific Gravity, Urine: 1.023 (ref 1.005–1.030)
pH: 5 (ref 5.0–8.0)

## 2016-08-31 LAB — CBC WITH DIFFERENTIAL/PLATELET
BASOS ABS: 0 10*3/uL (ref 0.0–0.1)
Basophils Relative: 0 %
EOS ABS: 0 10*3/uL (ref 0.0–0.7)
EOS PCT: 1 %
HCT: 42.3 % (ref 36.0–46.0)
Hemoglobin: 13.6 g/dL (ref 12.0–15.0)
LYMPHS PCT: 31 %
Lymphs Abs: 1.8 10*3/uL (ref 0.7–4.0)
MCH: 28 pg (ref 26.0–34.0)
MCHC: 32.2 g/dL (ref 30.0–36.0)
MCV: 87.2 fL (ref 78.0–100.0)
MONO ABS: 0.3 10*3/uL (ref 0.1–1.0)
Monocytes Relative: 5 %
Neutro Abs: 3.7 10*3/uL (ref 1.7–7.7)
Neutrophils Relative %: 63 %
PLATELETS: 168 10*3/uL (ref 150–400)
RBC: 4.85 MIL/uL (ref 3.87–5.11)
RDW: 15.1 % (ref 11.5–15.5)
WBC: 5.9 10*3/uL (ref 4.0–10.5)

## 2016-08-31 LAB — LIPASE, BLOOD: Lipase: 20 U/L (ref 11–51)

## 2016-08-31 MED ORDER — NAPROXEN 500 MG PO TABS: 500.0000 mg | ORAL_TABLET | Freq: Two times a day (BID) | ORAL | 0 refills | Status: AC

## 2016-08-31 MED ORDER — PREDNISONE 10 MG PO TABS: 30.0000 mg | ORAL_TABLET | Freq: Every day | ORAL | 0 refills | Status: AC

## 2016-08-31 MED ORDER — PREDNISONE 20 MG PO TABS
40.0000 mg | ORAL_TABLET | Freq: Once | ORAL | Status: AC
  Administered 2016-08-31: 40 mg via ORAL
  Filled 2016-08-31: qty 2

## 2016-08-31 MED ORDER — CEPHALEXIN 500 MG PO CAPS: 500.0000 mg | ORAL_CAPSULE | Freq: Four times a day (QID) | ORAL | 0 refills | Status: AC

## 2016-08-31 MED ORDER — MORPHINE SULFATE (PF) 4 MG/ML IV SOLN
2.0000 mg | Freq: Once | INTRAVENOUS | Status: AC
  Administered 2016-08-31: 2 mg via INTRAVENOUS
  Filled 2016-08-31: qty 1

## 2016-08-31 MED ORDER — ACETAMINOPHEN 500 MG PO TABS
1000.0000 mg | ORAL_TABLET | Freq: Once | ORAL | Status: AC
  Administered 2016-08-31: 1000 mg via ORAL
  Filled 2016-08-31: qty 2

## 2016-08-31 MED ORDER — LIDOCAINE 5 % EX PTCH: 1.0000 | MEDICATED_PATCH | CUTANEOUS | 0 refills | Status: AC

## 2016-08-31 MED ORDER — OXYCODONE-ACETAMINOPHEN 5-325 MG PO TABS: 2.0000 | ORAL_TABLET | Freq: Three times a day (TID) | ORAL | 0 refills | Status: AC | PRN

## 2016-08-31 MED ORDER — LIDOCAINE 5 % EX PTCH
1.0000 | MEDICATED_PATCH | CUTANEOUS | Status: DC
  Filled 2016-08-31: qty 1

## 2016-08-31 MED ORDER — MORPHINE SULFATE (PF) 4 MG/ML IV SOLN
4.0000 mg | Freq: Once | INTRAVENOUS | Status: AC
  Administered 2016-08-31: 4 mg via INTRAVENOUS
  Filled 2016-08-31: qty 1

## 2016-08-31 NOTE — ED Triage Notes (Signed)
Pt here for hip pain to right hip for 2 days no injury

## 2016-08-31 NOTE — Discharge Instructions (Signed)
You were evaluated in the ED for lower back and right hip pain. Your x-rays showed degenerative changes and arthritis but no fractures, dislocations or any other acute injuries. You were noted to have a urinary tract infection, this could explain your lower abdominal discomfort.  We will treat your urinary tract infection with antibiotics. Please take these as prescribed.Drink plenty of water to stay hydrated, enough to make your urine clear.  We will treat your low back and right hip pain with anti-inflammatories and steroids. As we discussed a short course of steroids can slightly increased your sugar levels, ensure that you are taking your diabetes medications as instructed. You have a short prescription for Percocet to use for only severe or breakthrough pain. This medication can cause drowsiness and platelet risk for falls. For mild, moderate pain take naproxen, Tylenol and she is lidocaine patch as instructed.  Contact your PCP today and make an appointment for a recheck in 1 week to ensure UTI and back/hip pain have resolved.  Percocet is a narcotic pain medication that has risk of overdose, death, dependence and abuse. Do not consume alcohol, drive or use heavy machinery while taking this medication. Do not leave unattended around children. The emergency department has a strict policy regarding prescription of narcotic medications. We are unable to refill this medication in the emergency department for chronic pain. Contact your primary care provider or specialist for chronic pain management and refill on narcotic medications.

## 2016-08-31 NOTE — ED Notes (Signed)
Pt called out stating the MD was in her room and said could eat, and would like her coffee since she's a diabetic.

## 2016-08-31 NOTE — ED Notes (Signed)
Patient transported to X-ray 

## 2016-08-31 NOTE — ED Notes (Signed)
Pt denies injury.

## 2016-08-31 NOTE — ED Provider Notes (Signed)
[facility]-EMERGENCY DEPT Provider Note   CSN: [id number] Arrival date & time: 08/31/16  0608     History   Chief Complaint Chief Complaint  Patient presents with  . Hip Pain    HPI Alison Doyle is a 81 y.o. female with history of diabetes and right total knee replacement presents to the ED with sudden onset, gradually worsening right lower back pain that radiates to right lateral leg to the knee 2 days. Pain is described as constant, radiating to right leg, burning. Patient states she vomited once last night from severe pain. Patient was cleaning up around her house before pain started. Denies falls or injury. No previous surgeries or history of hip pain. Walking, flexing her hip and putting weight on her right lower extremity exacerbates the pain. No alleviating factors, ibuprofen has provided no pain relief. No associated urinary symptoms, constipation, diarrhea, nausea, saddle anesthesia, numbness or weakness to lower legs, bladder/bowel incontinence or retention. Patient lives alone, at baseline she ambulates with her walker.  HPI  Past Medical History:  Diagnosis Date  . Diabetes mellitus     There are no active problems to display for this patient.   Past Surgical History:  Procedure Laterality Date  . REPLACEMENT TOTAL KNEE Right 2001    OB History    No data available       Home Medications    Prior to Admission medications   Medication Sig Start Date End Date Taking? Authorizing Provider  aspirin EC 81 MG tablet Take 81 mg by mouth daily.    [provider]  cephALEXin (KEFLEX) 500 MG capsule Take 1 capsule (500 mg total) by mouth 4 (four) times daily. 08/31/16 09/07/16  Liberty Handy, PA-C  hydrOXYzine (ATARAX/VISTARIL) 25 MG tablet Take 25 mg by mouth 3 (three) times daily as needed for anxiety.    [provider]  lidocaine (LIDODERM) 5 % Place 1 patch onto the skin daily. Apply to lower back or right hip. Change every 24 hours, for 5 days  08/31/16 09/05/16  Liberty Handy, PA-C  metFORMIN (GLUCOPHAGE) 850 MG tablet Take 850 mg by mouth 2 (two) times daily with a meal.    [provider]  naproxen (NAPROSYN) 500 MG tablet Take 1 tablet (500 mg total) by mouth 2 (two) times daily. 08/31/16 09/05/16  Liberty Handy, PA-C  oxyCODONE-acetaminophen (PERCOCET/ROXICET) 5-325 MG tablet Take 2 tablets by mouth every 8 (eight) hours as needed for severe pain. Take for break through and severe pain only. Do not take before driving or doing any exertional activity. Medicine may cause drowsiness and falls. 08/31/16 09/02/16  Liberty Handy, PA-C  pravastatin (PRAVACHOL) 20 MG tablet Take 20 mg by mouth at bedtime.    [provider]  predniSONE (DELTASONE) 10 MG tablet Take 3 tablets (30 mg total) by mouth daily. 08/31/16 09/05/16  Liberty Handy, PA-C  traMADol (ULTRAM) 50 MG tablet Take 50 mg by mouth every 6 (six) hours as needed. For pain    [provider]    Family History History reviewed. No pertinent family history.  Social History Social History  Substance Use Topics  . Smoking status: Current Every Day Smoker  . Smokeless tobacco: Not on file  . Alcohol use No     Allergies   Patient has no known allergies.   Review of Systems Review of Systems  Constitutional: Negative for fever.  Respiratory: Negative for cough and shortness of breath.   Cardiovascular: Negative  for chest pain.  Gastrointestinal: Positive for vomiting ("from pain"). Negative for abdominal pain, constipation, diarrhea and nausea.  Endocrine: Negative for polydipsia and polyuria.  Genitourinary: Negative for difficulty urinating, dysuria, hematuria, vaginal bleeding and vaginal discharge.  Musculoskeletal: Positive for arthralgias, back pain, gait problem and myalgias.  Skin: Negative for color change, rash and wound.  Allergic/Immunologic: Positive for immunocompromised state (DM).  Neurological: Negative for  weakness and numbness.     Physical Exam Updated Vital Signs BP 134/75   Pulse 63   Temp 98.6 F (37 C)   Resp (!) 23   SpO2 100%   Physical Exam  Constitutional: She is oriented to person, place, and time. She appears well-developed and well-nourished. No distress.  HENT:  Head: Normocephalic and atraumatic.  Nose: Nose normal.  Mouth/Throat: Oropharynx is clear and moist. No oropharyngeal exudate.  Eyes: Pupils are equal, round, and reactive to light. Conjunctivae and EOM are normal.  Neck: Normal range of motion. Neck supple.  Cardiovascular: Normal rate, regular rhythm, normal heart sounds and intact distal pulses.   No murmur heard. Pulmonary/Chest: Effort normal and breath sounds normal. No respiratory distress. She has no wheezes. She has no rales. She exhibits no tenderness.  Abdominal: Soft. Bowel sounds are normal. There is tenderness. There is no guarding.  Diffuse lower abdominal and suprapubic abdominal pain No CVAT  Musculoskeletal: She exhibits tenderness. She exhibits no deformity.  Midline L spine tenderness R sided paraspinal lumbar tenderness R SI joint and sciatic notch tenderness Positive SLR, Faber and Stinchfield (R) Decreased R hip flexion secondary to pain No pain with R hip IR/ER  Lymphadenopathy:    She has no cervical adenopathy.  Neurological: She is alert and oriented to person, place, and time. No sensory deficit.  5/5 strength with hips, knees and ankle F/E Sensation to light touch intact in LE   Skin: Skin is warm and dry. Capillary refill takes less than 2 seconds.  No rash or ecchymosis to lower back or lower extremities  Psychiatric: She has a normal mood and affect. Her behavior is normal. Judgment and thought content normal.  Nursing note and vitals reviewed.    ED Treatments / Results  Labs (all labs ordered are listed, but only abnormal results are displayed) Labs Reviewed  URINALYSIS, ROUTINE W REFLEX MICROSCOPIC - Abnormal;  Notable for the following:       Result Value   APPearance HAZY (*)    Glucose, UA 50 (*)    Nitrite POSITIVE (*)    Bacteria, UA MANY (*)    Squamous Epithelial / LPF 0-5 (*)    All other components within normal limits  COMPREHENSIVE METABOLIC PANEL - Abnormal; Notable for the following:    Glucose, Bld 211 (*)    Calcium 8.4 (*)    Total Protein 5.4 (*)    Albumin 2.9 (*)    ALT 9 (*)    All other components within normal limits  URINE CULTURE  CBC WITH DIFFERENTIAL/PLATELET  LIPASE, BLOOD    EKG  EKG Interpretation None       Radiology Dg Lumbar Spine Complete  Result Date: 08/31/2016 CLINICAL DATA:  Right-sided low back pain and right hip pain. EXAM: LUMBAR SPINE - COMPLETE 4+ VIEW COMPARISON:  CT 07/15/2008 FINDINGS: Minimal thoracolumbar curvature convex to the left and lower lumbar curvature convex to the right. Straightening of the normal lumbar lordosis. Disc degeneration throughout the lumbar region with disc space narrowing and marginal osteophytes. Lower lumbar facet osteoarthritis.  Mild sacroiliac osteoarthritis. No acute finding. Right upper quadrant clips consistent with previous cholecystectomy. Right flank fat necrosis calcification of no significance. Aortic atherosclerotic calcification without visible aneurysm. IMPRESSION: Chronic appearing degenerative disc disease and degenerative facet disease. Mild curvature. Mild sacroiliac osteoarthritis. No acute finding by radiography. Electronically Signed   By: Paulina Fusi M.D.   On: 08/31/2016 07:46   Dg Hip Unilat With Pelvis 2-3 Views Right  Result Date: 08/31/2016 CLINICAL DATA:  Right-sided hip pain. EXAM: DG HIP (WITH OR WITHOUT PELVIS) 2-3V RIGHT COMPARISON:  None. FINDINGS: There is no evidence of hip fracture or dislocation. There is no evidence of arthropathy or other focal bone abnormality. IMPRESSION: Negative. Electronically Signed   By: Paulina Fusi M.D.   On: 08/31/2016 07:48     Procedures Procedures (including critical care time)  Medications Ordered in ED Medications  morphine 4 MG/ML injection 2 mg (not administered)  acetaminophen (TYLENOL) tablet 1,000 mg (not administered)  predniSONE (DELTASONE) tablet 40 mg (not administered)  lidocaine (LIDODERM) 5 % 1 patch (not administered)  morphine 4 MG/ML injection 4 mg (4 mg Intravenous Given 08/31/16 0701)     Initial Impression / Assessment and Plan / ED Course  I have reviewed the triage vital signs and the nursing notes.  Pertinent labs & imaging results that were available during my care of the patient were reviewed by me and considered in my medical decision making (see chart for details).  Clinical Course as of Sep 01 934  Thu Aug 31, 2016  0806 Repeat abdominal exam much improved, lower abdomen no longer tender. Patient is hungry, requesting food. Will ambulate to bathroom to assess gait. Pending U/A  [CG]  0921 Nitrite: (!) POSITIVE [CG]  [id number] Repeat abdominal exam and back exam improved. Patient considered safe for discharge. Denies abdominal pain, nausea, requesting food.   [CG]    Clinical Course User Index [CG] Liberty Handy, PA-C   81 year old female presents to the ED for evaluation of sudden onset right lower back pain that radiates to her right leg 2 days. Prior to symptom onset patient has been cleaning up her house. No falls. No previous history of back pain or hip pain. No neurological deficits. No other associated symptoms reported by patient however on exam she has mild diffuse lower abdominal tenderness and suprapubic tenderness, she denies urinary symptoms. Given age, lack of similar symptoms in the past, abdominal tenderness on exam will get basic lab work and imaging. Will give analgesics and reassess. Considering sciatica vs hip arthropathy vs lytic lesions vs UTI.   Final Clinical Impressions(s) / ED Diagnoses   Final diagnoses:  Right hip pain  Acute right-sided low  back pain with right-sided sciatica  Acute cystitis without hematuria   Imaging of lumbar spine and right hip overall reassuring, degenerative disease without acute fracture or lytic lesions. Basic lab work reassuring, urinalysis shows positive nitrites. Discussed results with the patient. Patient considered safe for discharge at this time with antibiotics for UTI and symptomatic treatment for likely sciatica. Initial tenderness to lower abdomen has completely resolved, multiple repeat abdominal exam showed no abdominal tenderness. Doubt appendicitis since no leukocytosis, improved abdominal pain, no nausea, vomiting or diarrhea. She has a UTI which can explain lower abdominal discomfort. Patient ambulated in the ED to bathroom with a rolling walker, this is her baseline. She did very well without difficulty ambulating. Patient has non-insulin-dependent diabetes and is elderly, I discussed risk of prednisone in setting of diabetes and narcotic  pain medications in setting of age. She is aware that she is at risk of hyperglycemia with prednisone, drowsiness/mechanical falls from narcotic pain medication. Strict instructions on how to take these medicines. Patient verbalized understanding and is agreeable with plan. Advised patient to contact PCP today to make a follow-up appointment in 1 week to ensure resolution of symptoms.  Patient, ED treatment and discharge plan was discussed with supervising physician who is agreeable with plan.   New Prescriptions New Prescriptions   CEPHALEXIN (KEFLEX) 500 MG CAPSULE    Take 1 capsule (500 mg total) by mouth 4 (four) times daily.   LIDOCAINE (LIDODERM) 5 %    Place 1 patch onto the skin daily. Apply to lower back or right hip. Change every 24 hours, for 5 days   NAPROXEN (NAPROSYN) 500 MG TABLET    Take 1 tablet (500 mg total) by mouth 2 (two) times daily.   OXYCODONE-ACETAMINOPHEN (PERCOCET/ROXICET) 5-325 MG TABLET    Take 2 tablets by mouth every 8 (eight) hours  as needed for severe pain. Take for break through and severe pain only. Do not take before driving or doing any exertional activity. Medicine may cause drowsiness and falls.   PREDNISONE (DELTASONE) 10 MG TABLET    Take 3 tablets (30 mg total) by mouth daily.        Liberty Handy, PA-C 08/31/16 6962    Shon Baton, MD 09/01/16 [phone]

## 2016-08-31 NOTE — ED Notes (Signed)
Pt.ambulate well  With walker while walking along side pt.to restroom.and back

## 2016-09-01 LAB — URINE CULTURE

## 2016-09-02 ENCOUNTER — Telehealth: Payer: Self-pay

## 2016-09-02 NOTE — Telephone Encounter (Signed)
Post ED Visit - Positive Culture Follow-up  Culture report reviewed by antimicrobial stewardship pharmacist:  []  Alison Doyle, Pharm.D. []  Alison Doyle, Pharm.D., BCPS AQ-ID []  Alison Doyle, Pharm.D., BCPS []  Alison Doyle, Pharm.D., BCPS []  Alison Doyle, [address]Pharm[address]D., BCPS, AAHIVP []  Alison Doyle, Pharm.D., BCPS, AAHIVP []  Alison Doyle, PharmD, BCPS []  Alison Doyle, PharmD, BCPS []  Alison Doyle, PharmD, BCPS Alison Doyle Pharm D Positive urine culture Treated with Cephalexin, organism sensitive to the same and no further patient follow-up is required at this time.  Alison Doyle, Alison Doyle 09/02/2016, 9:38 AM

## 2016-12-26 ENCOUNTER — Emergency Department (HOSPITAL_COMMUNITY): Payer: Medicare Other

## 2016-12-26 ENCOUNTER — Encounter (HOSPITAL_COMMUNITY): Payer: Self-pay

## 2016-12-26 ENCOUNTER — Inpatient Hospital Stay (HOSPITAL_COMMUNITY): Payer: Medicare Other

## 2016-12-26 ENCOUNTER — Other Ambulatory Visit: Payer: Self-pay

## 2016-12-26 ENCOUNTER — Inpatient Hospital Stay (HOSPITAL_COMMUNITY)
Admission: EM | Admit: 2016-12-26 | Discharge: 2017-01-04 | DRG: 064 | Disposition: A | Discharge status: Skilled Nursing Facility | Payer: Medicare Other | Attending: Internal Medicine | Admitting: Internal Medicine

## 2016-12-26 DIAGNOSIS — I4891 Unspecified atrial fibrillation: Secondary | ICD-10-CM | POA: Diagnosis not present

## 2016-12-26 DIAGNOSIS — S199XXA Unspecified injury of neck, initial encounter: Secondary | ICD-10-CM | POA: Diagnosis not present

## 2016-12-26 DIAGNOSIS — M25521 Pain in right elbow: Secondary | ICD-10-CM | POA: Diagnosis not present

## 2016-12-26 DIAGNOSIS — R29810 Facial weakness: Secondary | ICD-10-CM | POA: Diagnosis not present

## 2016-12-26 DIAGNOSIS — M545 Low back pain: Secondary | ICD-10-CM | POA: Diagnosis not present

## 2016-12-26 DIAGNOSIS — F17210 Nicotine dependence, cigarettes, uncomplicated: Secondary | ICD-10-CM | POA: Diagnosis present

## 2016-12-26 DIAGNOSIS — I639 Cerebral infarction, unspecified: Secondary | ICD-10-CM | POA: Diagnosis not present

## 2016-12-26 DIAGNOSIS — S40022A Contusion of left upper arm, initial encounter: Secondary | ICD-10-CM | POA: Diagnosis not present

## 2016-12-26 DIAGNOSIS — B958 Unspecified staphylococcus as the cause of diseases classified elsewhere: Secondary | ICD-10-CM | POA: Diagnosis not present

## 2016-12-26 DIAGNOSIS — I6789 Other cerebrovascular disease: Secondary | ICD-10-CM | POA: Diagnosis not present

## 2016-12-26 DIAGNOSIS — R471 Dysarthria and anarthria: Secondary | ICD-10-CM | POA: Diagnosis not present

## 2016-12-26 DIAGNOSIS — S065X0A Traumatic subdural hemorrhage without loss of consciousness, initial encounter: Secondary | ICD-10-CM | POA: Diagnosis not present

## 2016-12-26 DIAGNOSIS — G929 Unspecified toxic encephalopathy: Secondary | ICD-10-CM

## 2016-12-26 DIAGNOSIS — E871 Hypo-osmolality and hyponatremia: Secondary | ICD-10-CM | POA: Diagnosis present

## 2016-12-26 DIAGNOSIS — S064X9A Epidural hemorrhage with loss of consciousness of unspecified duration, initial encounter: Secondary | ICD-10-CM | POA: Diagnosis present

## 2016-12-26 DIAGNOSIS — G92 Toxic encephalopathy: Secondary | ICD-10-CM | POA: Diagnosis not present

## 2016-12-26 DIAGNOSIS — S064X9D Epidural hemorrhage with loss of consciousness of unspecified duration, subsequent encounter: Secondary | ICD-10-CM | POA: Diagnosis not present

## 2016-12-26 DIAGNOSIS — A419 Sepsis, unspecified organism: Secondary | ICD-10-CM

## 2016-12-26 DIAGNOSIS — E872 Acidosis: Secondary | ICD-10-CM | POA: Diagnosis not present

## 2016-12-26 DIAGNOSIS — W1830XA Fall on same level, unspecified, initial encounter: Secondary | ICD-10-CM | POA: Diagnosis not present

## 2016-12-26 DIAGNOSIS — E1149 Type 2 diabetes mellitus with other diabetic neurological complication: Secondary | ICD-10-CM | POA: Diagnosis not present

## 2016-12-26 DIAGNOSIS — R7881 Bacteremia: Secondary | ICD-10-CM | POA: Diagnosis present

## 2016-12-26 DIAGNOSIS — W010XXA Fall on same level from slipping, tripping and stumbling without subsequent striking against object, initial encounter: Secondary | ICD-10-CM | POA: Diagnosis present

## 2016-12-26 DIAGNOSIS — R414 Neurologic neglect syndrome: Secondary | ICD-10-CM | POA: Diagnosis not present

## 2016-12-26 DIAGNOSIS — G8194 Hemiplegia, unspecified affecting left nondominant side: Secondary | ICD-10-CM | POA: Diagnosis not present

## 2016-12-26 DIAGNOSIS — S065XAA Traumatic subdural hemorrhage with loss of consciousness status unknown, initial encounter: Secondary | ICD-10-CM | POA: Insufficient documentation

## 2016-12-26 DIAGNOSIS — M25512 Pain in left shoulder: Secondary | ICD-10-CM | POA: Diagnosis not present

## 2016-12-26 DIAGNOSIS — S0993XA Unspecified injury of face, initial encounter: Secondary | ICD-10-CM | POA: Diagnosis not present

## 2016-12-26 DIAGNOSIS — I63411 Cerebral infarction due to embolism of right middle cerebral artery: Principal | ICD-10-CM

## 2016-12-26 DIAGNOSIS — Z96651 Presence of right artificial knee joint: Secondary | ICD-10-CM | POA: Diagnosis present

## 2016-12-26 DIAGNOSIS — Z6832 Body mass index (BMI) 32.0-32.9, adult: Secondary | ICD-10-CM

## 2016-12-26 DIAGNOSIS — S42401A Unspecified fracture of lower end of right humerus, initial encounter for closed fracture: Secondary | ICD-10-CM

## 2016-12-26 DIAGNOSIS — A411 Sepsis due to other specified staphylococcus: Secondary | ICD-10-CM | POA: Diagnosis not present

## 2016-12-26 DIAGNOSIS — I629 Nontraumatic intracranial hemorrhage, unspecified: Secondary | ICD-10-CM | POA: Diagnosis not present

## 2016-12-26 DIAGNOSIS — R1312 Dysphagia, oropharyngeal phase: Secondary | ICD-10-CM | POA: Diagnosis not present

## 2016-12-26 DIAGNOSIS — R4182 Altered mental status, unspecified: Secondary | ICD-10-CM | POA: Diagnosis not present

## 2016-12-26 DIAGNOSIS — R404 Transient alteration of awareness: Secondary | ICD-10-CM | POA: Diagnosis not present

## 2016-12-26 DIAGNOSIS — Z7984 Long term (current) use of oral hypoglycemic drugs: Secondary | ICD-10-CM

## 2016-12-26 DIAGNOSIS — I10 Essential (primary) hypertension: Secondary | ICD-10-CM | POA: Diagnosis present

## 2016-12-26 DIAGNOSIS — M79603 Pain in arm, unspecified: Secondary | ICD-10-CM | POA: Diagnosis not present

## 2016-12-26 DIAGNOSIS — Z23 Encounter for immunization: Secondary | ICD-10-CM | POA: Diagnosis not present

## 2016-12-26 DIAGNOSIS — E119 Type 2 diabetes mellitus without complications: Secondary | ICD-10-CM | POA: Diagnosis not present

## 2016-12-26 DIAGNOSIS — Y92003 Bedroom of unspecified non-institutional (private) residence as the place of occurrence of the external cause: Secondary | ICD-10-CM

## 2016-12-26 DIAGNOSIS — I480 Paroxysmal atrial fibrillation: Secondary | ICD-10-CM | POA: Diagnosis not present

## 2016-12-26 DIAGNOSIS — I517 Cardiomegaly: Secondary | ICD-10-CM | POA: Diagnosis not present

## 2016-12-26 DIAGNOSIS — R29818 Other symptoms and signs involving the nervous system: Secondary | ICD-10-CM | POA: Diagnosis not present

## 2016-12-26 DIAGNOSIS — E876 Hypokalemia: Secondary | ICD-10-CM | POA: Diagnosis present

## 2016-12-26 DIAGNOSIS — R278 Other lack of coordination: Secondary | ICD-10-CM | POA: Diagnosis not present

## 2016-12-26 DIAGNOSIS — R6520 Severe sepsis without septic shock: Secondary | ICD-10-CM | POA: Diagnosis not present

## 2016-12-26 DIAGNOSIS — R062 Wheezing: Secondary | ICD-10-CM | POA: Diagnosis not present

## 2016-12-26 DIAGNOSIS — H53462 Homonymous bilateral field defects, left side: Secondary | ICD-10-CM | POA: Diagnosis not present

## 2016-12-26 DIAGNOSIS — J189 Pneumonia, unspecified organism: Secondary | ICD-10-CM

## 2016-12-26 DIAGNOSIS — G8929 Other chronic pain: Secondary | ICD-10-CM | POA: Diagnosis present

## 2016-12-26 DIAGNOSIS — S065X9A Traumatic subdural hemorrhage with loss of consciousness of unspecified duration, initial encounter: Secondary | ICD-10-CM

## 2016-12-26 DIAGNOSIS — I609 Nontraumatic subarachnoid hemorrhage, unspecified: Secondary | ICD-10-CM

## 2016-12-26 DIAGNOSIS — S064XAA Epidural hemorrhage with loss of consciousness status unknown, initial encounter: Secondary | ICD-10-CM | POA: Diagnosis present

## 2016-12-26 DIAGNOSIS — M6281 Muscle weakness (generalized): Secondary | ICD-10-CM | POA: Diagnosis not present

## 2016-12-26 DIAGNOSIS — S0083XA Contusion of other part of head, initial encounter: Secondary | ICD-10-CM | POA: Diagnosis not present

## 2016-12-26 DIAGNOSIS — I340 Nonrheumatic mitral (valve) insufficiency: Secondary | ICD-10-CM | POA: Diagnosis not present

## 2016-12-26 DIAGNOSIS — Z7982 Long term (current) use of aspirin: Secondary | ICD-10-CM | POA: Diagnosis not present

## 2016-12-26 DIAGNOSIS — E669 Obesity, unspecified: Secondary | ICD-10-CM | POA: Diagnosis present

## 2016-12-26 DIAGNOSIS — I621 Nontraumatic extradural hemorrhage: Secondary | ICD-10-CM | POA: Diagnosis not present

## 2016-12-26 DIAGNOSIS — S066X9A Traumatic subarachnoid hemorrhage with loss of consciousness of unspecified duration, initial encounter: Secondary | ICD-10-CM | POA: Diagnosis not present

## 2016-12-26 DIAGNOSIS — S0990XA Unspecified injury of head, initial encounter: Secondary | ICD-10-CM | POA: Diagnosis not present

## 2016-12-26 DIAGNOSIS — I6330 Cerebral infarction due to thrombosis of unspecified cerebral artery: Secondary | ICD-10-CM

## 2016-12-26 DIAGNOSIS — E785 Hyperlipidemia, unspecified: Secondary | ICD-10-CM | POA: Diagnosis not present

## 2016-12-26 DIAGNOSIS — N390 Urinary tract infection, site not specified: Secondary | ICD-10-CM | POA: Diagnosis not present

## 2016-12-26 DIAGNOSIS — M25511 Pain in right shoulder: Secondary | ICD-10-CM | POA: Diagnosis present

## 2016-12-26 DIAGNOSIS — M549 Dorsalgia, unspecified: Secondary | ICD-10-CM | POA: Diagnosis not present

## 2016-12-26 DIAGNOSIS — I63131 Cerebral infarction due to embolism of right carotid artery: Secondary | ICD-10-CM | POA: Diagnosis not present

## 2016-12-26 DIAGNOSIS — R4020 Unspecified coma: Secondary | ICD-10-CM | POA: Diagnosis not present

## 2016-12-26 DIAGNOSIS — M75101 Unspecified rotator cuff tear or rupture of right shoulder, not specified as traumatic: Secondary | ICD-10-CM | POA: Diagnosis present

## 2016-12-26 DIAGNOSIS — S40021A Contusion of right upper arm, initial encounter: Secondary | ICD-10-CM | POA: Diagnosis not present

## 2016-12-26 LAB — CBC WITH DIFFERENTIAL/PLATELET
Basophils Absolute: 0 10*3/uL (ref 0.0–0.1)
Basophils Relative: 0 %
Eosinophils Absolute: 0 10*3/uL (ref 0.0–0.7)
Eosinophils Relative: 0 %
HCT: 47.8 % — ABNORMAL HIGH (ref 36.0–46.0)
Hemoglobin: 15.6 g/dL — ABNORMAL HIGH (ref 12.0–15.0)
Lymphocytes Relative: 8 %
Lymphs Abs: 1.1 10*3/uL (ref 0.7–4.0)
MCH: 28 pg (ref 26.0–34.0)
MCHC: 32.6 g/dL (ref 30.0–36.0)
MCV: 85.8 fL (ref 78.0–100.0)
Monocytes Absolute: 1 10*3/uL (ref 0.1–1.0)
Monocytes Relative: 7 %
Neutro Abs: 11.7 10*3/uL — ABNORMAL HIGH (ref 1.7–7.7)
Neutrophils Relative %: 85 %
Platelets: 221 10*3/uL (ref 150–400)
RBC: 5.57 MIL/uL — ABNORMAL HIGH (ref 3.87–5.11)
RDW: 14.4 % (ref 11.5–15.5)
WBC: 13.8 10*3/uL — ABNORMAL HIGH (ref 4.0–10.5)

## 2016-12-26 LAB — COMPREHENSIVE METABOLIC PANEL
ALT: 15 U/L (ref 14–54)
AST: 39 U/L (ref 15–41)
Albumin: 3.3 g/dL — ABNORMAL LOW (ref 3.5–5.0)
Alkaline Phosphatase: 97 U/L (ref 38–126)
Anion gap: 13 (ref 5–15)
BUN: 12 mg/dL (ref 6–20)
CO2: 19 mmol/L — ABNORMAL LOW (ref 22–32)
Calcium: 8.6 mg/dL — ABNORMAL LOW (ref 8.9–10.3)
Chloride: 105 mmol/L (ref 101–111)
Creatinine, Ser: 0.94 mg/dL (ref 0.44–1.00)
GFR calc Af Amer: >60 mL/min (ref >60)
GFR calc non Af Amer: 57 mL/min — ABNORMAL LOW (ref >60)
Glucose, Bld: 329 mg/dL — ABNORMAL HIGH (ref 65–99)
Potassium: 4.2 mmol/L (ref 3.5–5.1)
Sodium: 137 mmol/L (ref 135–145)
Total Bilirubin: 0.8 mg/dL (ref 0.3–1.2)
Total Protein: 6.7 g/dL (ref 6.5–8.1)

## 2016-12-26 LAB — URINALYSIS, ROUTINE W REFLEX MICROSCOPIC
Bilirubin Urine: NEGATIVE
Glucose, UA: >=500 mg/dL — AB
Ketones, ur: 20 mg/dL — AB
Leukocytes, UA: NEGATIVE
Nitrite: POSITIVE — AB
Protein, ur: >=300 mg/dL — AB
Specific Gravity, Urine: 1.027 (ref 1.005–1.030)
pH: 5 (ref 5.0–8.0)

## 2016-12-26 LAB — CK: CK TOTAL: 2293 U/L — AB (ref 38–234)

## 2016-12-26 LAB — GLUCOSE, CAPILLARY: Glucose-Capillary: 343 mg/dL — ABNORMAL HIGH (ref 65–99)

## 2016-12-26 LAB — CG4 I-STAT (LACTIC ACID): Lactic Acid, Venous: 1.63 mmol/L (ref 0.5–1.9)

## 2016-12-26 LAB — CBG MONITORING, ED: GLUCOSE-CAPILLARY: 339 mg/dL — AB (ref 65–99)

## 2016-12-26 LAB — PROTIME-INR
INR: 1.13
Prothrombin Time: 14.4 seconds (ref 11.4–15.2)

## 2016-12-26 LAB — I-STAT CG4 LACTIC ACID, ED: Lactic Acid, Venous: 4.25 mmol/L (ref 0.5–1.9)

## 2016-12-26 LAB — MRSA PCR SCREENING: MRSA BY PCR: NEGATIVE

## 2016-12-26 MED ORDER — VANCOMYCIN HCL IN DEXTROSE 750-5 MG/150ML-% IV SOLN
750.0000 mg | Freq: Two times a day (BID) | INTRAVENOUS | Status: DC
  Administered 2016-12-27: 750 mg via INTRAVENOUS
  Filled 2016-12-26: qty 150

## 2016-12-26 MED ORDER — ONDANSETRON HCL 4 MG/2ML IJ SOLN
4.0000 mg | Freq: Four times a day (QID) | INTRAMUSCULAR | Status: DC | PRN
  Administered 2016-12-27 – 2016-12-30 (×2): 4 mg via INTRAVENOUS
  Filled 2016-12-26 (×2): qty 2

## 2016-12-26 MED ORDER — FUROSEMIDE 10 MG/ML IJ SOLN
60.0000 mg | Freq: Once | INTRAMUSCULAR | Status: AC
  Administered 2016-12-26: 60 mg via INTRAVENOUS
  Filled 2016-12-26: qty 6

## 2016-12-26 MED ORDER — INSULIN ASPART 100 UNIT/ML ~~LOC~~ SOLN: 0.0000 [IU] | Freq: Every day | SUBCUTANEOUS | Status: DC

## 2016-12-26 MED ORDER — INSULIN ASPART 100 UNIT/ML ~~LOC~~ SOLN
0.0000 [IU] | Freq: Every day | SUBCUTANEOUS | Status: DC
Start: 2016-12-26 — End: 2017-01-04
  Administered 2016-12-26: 5 [IU] via SUBCUTANEOUS
  Administered 2016-12-27 – 2017-01-01 (×3): 2 [IU] via SUBCUTANEOUS

## 2016-12-26 MED ORDER — PIPERACILLIN-TAZOBACTAM 3.375 G IVPB 30 MIN
3.3750 g | Freq: Once | INTRAVENOUS | Status: AC
  Administered 2016-12-26: 3.375 g via INTRAVENOUS
  Filled 2016-12-26: qty 50

## 2016-12-26 MED ORDER — IPRATROPIUM BROMIDE 0.02 % IN SOLN
0.5000 mg | Freq: Four times a day (QID) | RESPIRATORY_TRACT | Status: DC
  Administered 2016-12-27 (×2): 0.5 mg via RESPIRATORY_TRACT
  Filled 2016-12-26 (×2): qty 2.5

## 2016-12-26 MED ORDER — ALBUTEROL SULFATE (2.5 MG/3ML) 0.083% IN NEBU
2.5000 mg | INHALATION_SOLUTION | Freq: Four times a day (QID) | RESPIRATORY_TRACT | Status: DC
  Administered 2016-12-27 (×2): 2.5 mg via RESPIRATORY_TRACT
  Filled 2016-12-26 (×2): qty 3

## 2016-12-26 MED ORDER — INSULIN ASPART 100 UNIT/ML ~~LOC~~ SOLN: 0.0000 [IU] | Freq: Three times a day (TID) | SUBCUTANEOUS | Status: DC

## 2016-12-26 MED ORDER — SODIUM CHLORIDE 0.9 % IV SOLN
2000.0000 mg | Freq: Once | INTRAVENOUS | Status: AC
  Administered 2016-12-26: 2000 mg via INTRAVENOUS
  Filled 2016-12-26: qty 2000

## 2016-12-26 MED ORDER — PIPERACILLIN-TAZOBACTAM 3.375 G IVPB
3.3750 g | Freq: Three times a day (TID) | INTRAVENOUS | Status: DC
  Administered 2016-12-27 (×2): 3.375 g via INTRAVENOUS
  Filled 2016-12-26 (×3): qty 50

## 2016-12-26 MED ORDER — ONDANSETRON HCL 4 MG PO TABS: 4.0000 mg | ORAL_TABLET | Freq: Four times a day (QID) | ORAL | Status: DC | PRN

## 2016-12-26 MED ORDER — ACETAMINOPHEN 650 MG RE SUPP
650.0000 mg | Freq: Once | RECTAL | Status: AC
  Administered 2016-12-26: 650 mg via RECTAL
  Filled 2016-12-26: qty 1

## 2016-12-26 MED ORDER — ORAL CARE MOUTH RINSE
15.0000 mL | Freq: Two times a day (BID) | OROMUCOSAL | Status: DC
  Administered 2016-12-27 – 2017-01-04 (×16): 15 mL via OROMUCOSAL

## 2016-12-26 MED ORDER — INSULIN ASPART 100 UNIT/ML ~~LOC~~ SOLN
0.0000 [IU] | Freq: Three times a day (TID) | SUBCUTANEOUS | Status: DC
  Administered 2016-12-27 (×2): 5 [IU] via SUBCUTANEOUS
  Administered 2016-12-28: 3 [IU] via SUBCUTANEOUS
  Administered 2016-12-28: 7 [IU] via SUBCUTANEOUS
  Administered 2016-12-28: 2 [IU] via SUBCUTANEOUS
  Administered 2016-12-29: 3 [IU] via SUBCUTANEOUS
  Administered 2016-12-29: 2 [IU] via SUBCUTANEOUS
  Administered 2016-12-29: 1 [IU] via SUBCUTANEOUS
  Administered 2016-12-30: 2 [IU] via SUBCUTANEOUS
  Administered 2016-12-30: 1 [IU] via SUBCUTANEOUS
  Administered 2016-12-30 – 2016-12-31 (×4): 2 [IU] via SUBCUTANEOUS
  Administered 2017-01-01: 3 [IU] via SUBCUTANEOUS
  Administered 2017-01-01 – 2017-01-03 (×6): 2 [IU] via SUBCUTANEOUS
  Administered 2017-01-03: 3 [IU] via SUBCUTANEOUS
  Administered 2017-01-04: 5 [IU] via SUBCUTANEOUS
  Administered 2017-01-04: 3 [IU] via SUBCUTANEOUS

## 2016-12-26 MED ORDER — DEXTROSE-NACL 5-0.45 % IV SOLN
INTRAVENOUS | Status: DC
  Administered 2016-12-26: 21:00:00 via INTRAVENOUS

## 2016-12-26 MED ORDER — SODIUM CHLORIDE 0.9 % IV BOLUS (SEPSIS)
30.0000 mL/kg | Freq: Once | INTRAVENOUS | Status: AC
Start: 2016-12-26 — End: 2016-12-26
  Administered 2016-12-26: 2721 mL via INTRAVENOUS

## 2016-12-26 NOTE — ED Provider Notes (Signed)
[facility] Provider Note   CSN: [id number] Arrival date & time: 12/26/16  1644     History   Chief Complaint Chief Complaint  Patient presents with  . Altered Mental Status  . Fever    HPI Alison Doyle is a 81 y.o. female.  HPI  81 year old female brought in for evaluation of change in mental status.  Last known normal around 4-5 PM yesterday.  She was checked on today around 4 PM after she did not answer the telephone several times.  She was found laying prone beside her bed.  Very confused. Vomit and diarrhea around her. She was able to tell her son she fell last night but not much additional history.   Past Medical History:  Diagnosis Date  . Diabetes mellitus     There are no active problems to display for this patient.   Past Surgical History:  Procedure Laterality Date  . REPLACEMENT TOTAL KNEE Right 2001    OB History    No data available       Home Medications    Prior to Admission medications   Medication Sig Start Date End Date Taking? Authorizing Provider  aspirin EC 81 MG tablet Take 81 mg by mouth daily.   Yes [provider]  liraglutide (VICTOZA) 18 MG/3ML SOPN Inject 1.2 mg daily into the skin.    Yes [provider]  metFORMIN (GLUCOPHAGE) 850 MG tablet Take 850 mg by mouth 2 (two) times daily with a meal.   Yes [provider]  pravastatin (PRAVACHOL) 20 MG tablet Take 20 mg by mouth at bedtime.   Yes [provider]  traMADol (ULTRAM) 50 MG tablet Take 50-100 mg every 4 (four) hours as needed by mouth (for pain/MAX OF 6 TABLETS IN 24 HOURS).    Yes [provider]  hydrOXYzine (ATARAX/VISTARIL) 25 MG tablet Take 25 mg by mouth 3 (three) times daily as needed for anxiety.    [provider]    Family History History reviewed. No pertinent family history.  Social History Social History   Tobacco Use  . Smoking status: Current Every Day Smoker   Packs/day: 1.00    Types: Cigarettes  Substance Use Topics  . Alcohol use: No  . Drug use: No     Allergies   Patient has no known allergies.   Review of Systems Review of Systems  Level 5 caveat because of confusion.  Physical Exam Updated Vital Signs BP (!) 163/97   Pulse (!) 110   Temp (!) 101.9 F (38.8 C) (Rectal)   Resp (!) 32   Ht 5\' 6"  (1.676 m)   Wt 90.7 kg (200 lb)   SpO2 100%   BMI 32.28 kg/m   Physical Exam  Constitutional: She appears well-developed.  HENT:  B/l periorbital ecchymosis.  Eyes: Pupils are equal, round, and reactive to light. Right eye exhibits no discharge. Left eye exhibits no discharge.  Neck: Neck supple.  Cardiovascular: Normal rate, regular rhythm and normal heart sounds. Exam reveals no gallop and no friction rub.  No murmur heard. Pulmonary/Chest: Effort normal and breath sounds normal. No respiratory distress.  Abdominal: Soft. She exhibits no distension. There is no tenderness.  Musculoskeletal: She exhibits no edema or tenderness.  Skin tear R elbow. Doesn't seem to be tender or react when ranged. Does seem to guard R shoulder though. Keeps R arm flexed and resists extension. Possibly wincing when abducting shoulder but inconsistent. Faint areas  of erythema anteriorly in multiple areas which wound be most dependent/in contact with ground if prone. R knee replacement and laxity in joint consistent with this. No apparent reaction with passive ROM of b/l hips/LE.   Neurological: She is alert.  Skin: Skin is warm and dry.  Psychiatric:  Very confused. Will respond to some questions but inconsistent. Able to tell me her name. Will wiggle toes and fingers R hand minimally. Will withdrawal LLE from pain. No discernable movement LUE.   Nursing note and vitals reviewed.    ED Treatments / Results  Labs (all labs ordered are listed, but only abnormal results are displayed) Labs Reviewed  COMPREHENSIVE METABOLIC PANEL - Abnormal; Notable  for the following components:      Result Value   CO2 19 (*)    Glucose, Bld 329 (*)    Calcium 8.6 (*)    Albumin 3.3 (*)    GFR calc non Af Amer 57 (*)    All other components within normal limits  CBC WITH DIFFERENTIAL/PLATELET - Abnormal; Notable for the following components:   WBC 13.8 (*)    RBC 5.57 (*)    Hemoglobin 15.6 (*)    HCT 47.8 (*)    Neutro Abs 11.7 (*)    All other components within normal limits  URINALYSIS, ROUTINE W REFLEX MICROSCOPIC - Abnormal; Notable for the following components:   Color, Urine AMBER (*)    APPearance CLOUDY (*)    Glucose, UA >=500 (*)    Hgb urine dipstick LARGE (*)    Ketones, ur 20 (*)    Protein, ur >=300 (*)    Nitrite POSITIVE (*)    Bacteria, UA MANY (*)    Squamous Epithelial / LPF 0-5 (*)    All other components within normal limits  CBG MONITORING, ED - Abnormal; Notable for the following components:   Glucose-Capillary 339 (*)    All other components within normal limits  I-STAT CG4 LACTIC ACID, ED - Abnormal; Notable for the following components:   Lactic Acid, Venous 4.25 (*)    All other components within normal limits  CULTURE, BLOOD (ROUTINE X 2)  CULTURE, BLOOD (ROUTINE X 2)  PROTIME-INR  CK  I-STAT CG4 LACTIC ACID, ED    EKG  EKG Interpretation  Date/Time:  Tuesday December 26 2016 17:00:14 EST Ventricular Rate:  103 PR Interval:    QRS Duration: 94 QT Interval:  373 QTC Calculation: 489 R Axis:   70 Text Interpretation:  Sinus tachycardia ST depr, consider ischemia, anterior leads Borderline prolonged QT interval Rate is faster Confirmed by Paula Libra [id number]) on 12/27/2016 1:36:11 PM       Radiology Dg Shoulder Right  Result Date: 12/26/2016 CLINICAL DATA:  Pt c/o right-sided shoulder pain after falling in her home today. Pt is confused and has slurred speech. Best obtainable images due to pt condition. Axillary view was unobtainable. EXAM: RIGHT SHOULDER - 2+ VIEW COMPARISON:  None. FINDINGS:  There is no acute fracture or subluxation. Subacromial narrowing is noted. Bones appear osteopenic. The right lung apex is unremarkable in appearance. IMPRESSION: 1. Subacromial narrowing which can be associated with chronic rotator cuff injury. 2.  No evidence for acute  abnormality. Electronically Signed   By: Norva Pavlov M.D.   On: 12/26/2016 18:14   Ct Head Wo Contrast  Result Date: 12/26/2016 CLINICAL DATA:  Altered level of consciousness, incontinent of stool and urine with facial bruising and slurred speech. Right-sided facial droop. EXAM: CT HEAD  WITHOUT CONTRAST CT MAXILLOFACIAL WITHOUT CONTRAST CT CERVICAL SPINE WITHOUT CONTRAST TECHNIQUE: Multidetector CT imaging of the head, cervical spine, and maxillofacial structures were performed using the standard protocol without intravenous contrast. Multiplanar CT image reconstructions of the cervical spine and maxillofacial structures were also generated. COMPARISON:  None. FINDINGS: CT HEAD FINDINGS BRAIN: There is sulcal and ventricular prominence consistent with superficial and central atrophy. There is an acute right-sided subdural hematoma overlying the periphery of the right temporal lobe measuring up to 10 mm in thickness with small subarachnoid focus of hemorrhage in the right parietal lobe. Chronic moderate small vessel ischemia of periventricular subcortical white matter. No intraparenchymal hemorrhage is noted. No midline shift is seen. No large vascular territory infarct is noted. VASCULAR: Mild to moderate calcific atherosclerosis of the carotid siphons. SKULL: No skull fracture. No significant scalp soft tissue swelling. SINUSES/ORBITS: The mastoid air-cells are clear. Mild ethmoid sinus mucosal thickening. No air-fluid levels. The included ocular globes and orbital contents are non-suspicious. Bilateral lens replacements. OTHER: None. CT MAXILLOFACIAL FINDINGS Osseous: No acute maxillofacial fracture. Orbits: Intact orbits and globes. No  retrobulbar hematoma. Bilateral lens replacements. Extraocular muscles are symmetric in appearance. Sinuses: Mild ethmoid sinus mucosal thickening. Soft tissues: Right periorbital contusion with soft tissue swelling. Mild soft tissue swelling of the forehead. CT CERVICAL SPINE FINDINGS Alignment: Intact craniocervical relationship. Osteoarthritis of the atlantodental interval with subcortical cystic change of the axis in odontoid process. Maintained cervical lordosis. Skull base and vertebrae: No acute fracture. No primary bone lesion or focal pathologic process. Soft tissues and spinal canal: No prevertebral fluid or swelling. No visible canal hematoma. Disc levels: Cervical spondylitic disc space narrowing C4 through C7 with small posterior marginal osteophytes at C5-6 C6-7 contributing to mild neural foraminal narrowing. No significant central canal stenosis. Upper chest: Scarring at the apices. Other: Moderate atherosclerosis of the extracranial carotids. IMPRESSION: 1. Acute right-sided subdural hematoma overlying the convexity of the right temporal lobe with small focus of subarachnoid hemorrhage in the adjacent right parietal lobe. The hematoma measures up to 10 mm in thickness. No significant midline shift. Critical Value/emergent results were called by telephone at the time of interpretation on 12/26/2016 at 6:50 pm to Dr. Raeford Razor , who verbally acknowledged these results. 2. Atrophy with chronic moderate small vessel ischemic disease. 3. Right periorbital and forehead soft tissue contusion and swelling. No acute maxillofacial fracture. 4. Cervical spondylosis without acute posttraumatic cervical spine fracture or subluxation. Electronically Signed   By: Tollie Eth M.D.   On: 12/26/2016 18:52   Ct Cervical Spine Wo Contrast  Result Date: 12/26/2016 CLINICAL DATA:  Altered level of consciousness, incontinent of stool and urine with facial bruising and slurred speech. Right-sided facial droop. EXAM:  CT HEAD WITHOUT CONTRAST CT MAXILLOFACIAL WITHOUT CONTRAST CT CERVICAL SPINE WITHOUT CONTRAST TECHNIQUE: Multidetector CT imaging of the head, cervical spine, and maxillofacial structures were performed using the standard protocol without intravenous contrast. Multiplanar CT image reconstructions of the cervical spine and maxillofacial structures were also generated. COMPARISON:  None. FINDINGS: CT HEAD FINDINGS BRAIN: There is sulcal and ventricular prominence consistent with superficial and central atrophy. There is an acute right-sided subdural hematoma overlying the periphery of the right temporal lobe measuring up to 10 mm in thickness with small subarachnoid focus of hemorrhage in the right parietal lobe. Chronic moderate small vessel ischemia of periventricular subcortical white matter. No intraparenchymal hemorrhage is noted. No midline shift is seen. No large vascular territory infarct is noted. VASCULAR: Mild to moderate calcific  atherosclerosis of the carotid siphons. SKULL: No skull fracture. No significant scalp soft tissue swelling. SINUSES/ORBITS: The mastoid air-cells are clear. Mild ethmoid sinus mucosal thickening. No air-fluid levels. The included ocular globes and orbital contents are non-suspicious. Bilateral lens replacements. OTHER: None. CT MAXILLOFACIAL FINDINGS Osseous: No acute maxillofacial fracture. Orbits: Intact orbits and globes. No retrobulbar hematoma. Bilateral lens replacements. Extraocular muscles are symmetric in appearance. Sinuses: Mild ethmoid sinus mucosal thickening. Soft tissues: Right periorbital contusion with soft tissue swelling. Mild soft tissue swelling of the forehead. CT CERVICAL SPINE FINDINGS Alignment: Intact craniocervical relationship. Osteoarthritis of the atlantodental interval with subcortical cystic change of the axis in odontoid process. Maintained cervical lordosis. Skull base and vertebrae: No acute fracture. No primary bone lesion or focal pathologic  process. Soft tissues and spinal canal: No prevertebral fluid or swelling. No visible canal hematoma. Disc levels: Cervical spondylitic disc space narrowing C4 through C7 with small posterior marginal osteophytes at C5-6 C6-7 contributing to mild neural foraminal narrowing. No significant central canal stenosis. Upper chest: Scarring at the apices. Other: Moderate atherosclerosis of the extracranial carotids. IMPRESSION: 1. Acute right-sided subdural hematoma overlying the convexity of the right temporal lobe with small focus of subarachnoid hemorrhage in the adjacent right parietal lobe. The hematoma measures up to 10 mm in thickness. No significant midline shift. Critical Value/emergent results were called by telephone at the time of interpretation on 12/26/2016 at 6:50 pm to Dr. Raeford RazorSTEPHEN Emmelina Mcloughlin , who verbally acknowledged these results. 2. Atrophy with chronic moderate small vessel ischemic disease. 3. Right periorbital and forehead soft tissue contusion and swelling. No acute maxillofacial fracture. 4. Cervical spondylosis without acute posttraumatic cervical spine fracture or subluxation. Electronically Signed   By: Tollie Ethavid  Kwon M.D.   On: 12/26/2016 18:52   Dg Chest Portable 1 View  Result Date: 12/26/2016 CLINICAL DATA:  Altered level of consciousness, incontinent, slurred speech, RIGHT facial droop, posturing with RIGHT arm opinion, history diabetes mellitus, last seen normal 1600 hours yesterday, smoker EXAM: PORTABLE CHEST 1 VIEW COMPARISON:  Portable exam 1719 hours compared to 07/09/2011 FINDINGS: Enlargement of cardiac silhouette. Atherosclerotic calcification aorta. Mediastinal contours and pulmonary vascularity normal. Accentuated interstitial markings in the mid to lower lungs bilaterally question mild pulmonary edema. No pleural effusion or pneumothorax. Severe osseous demineralization with LEFT glenohumeral degenerative changes noted. IMPRESSION: Enlargement of cardiac silhouette with question mild  diffuse pulmonary edema. Electronically Signed   By: Ulyses SouthwardMark  Boles M.D.   On: 12/26/2016 17:34   Ct Maxillofacial Wo Contrast  Result Date: 12/26/2016 CLINICAL DATA:  Altered level of consciousness, incontinent of stool and urine with facial bruising and slurred speech. Right-sided facial droop. EXAM: CT HEAD WITHOUT CONTRAST CT MAXILLOFACIAL WITHOUT CONTRAST CT CERVICAL SPINE WITHOUT CONTRAST TECHNIQUE: Multidetector CT imaging of the head, cervical spine, and maxillofacial structures were performed using the standard protocol without intravenous contrast. Multiplanar CT image reconstructions of the cervical spine and maxillofacial structures were also generated. COMPARISON:  None. FINDINGS: CT HEAD FINDINGS BRAIN: There is sulcal and ventricular prominence consistent with superficial and central atrophy. There is an acute right-sided subdural hematoma overlying the periphery of the right temporal lobe measuring up to 10 mm in thickness with small subarachnoid focus of hemorrhage in the right parietal lobe. Chronic moderate small vessel ischemia of periventricular subcortical white matter. No intraparenchymal hemorrhage is noted. No midline shift is seen. No large vascular territory infarct is noted. VASCULAR: Mild to moderate calcific atherosclerosis of the carotid siphons. SKULL: No skull fracture. No significant scalp  soft tissue swelling. SINUSES/ORBITS: The mastoid air-cells are clear. Mild ethmoid sinus mucosal thickening. No air-fluid levels. The included ocular globes and orbital contents are non-suspicious. Bilateral lens replacements. OTHER: None. CT MAXILLOFACIAL FINDINGS Osseous: No acute maxillofacial fracture. Orbits: Intact orbits and globes. No retrobulbar hematoma. Bilateral lens replacements. Extraocular muscles are symmetric in appearance. Sinuses: Mild ethmoid sinus mucosal thickening. Soft tissues: Right periorbital contusion with soft tissue swelling. Mild soft tissue swelling of the  forehead. CT CERVICAL SPINE FINDINGS Alignment: Intact craniocervical relationship. Osteoarthritis of the atlantodental interval with subcortical cystic change of the axis in odontoid process. Maintained cervical lordosis. Skull base and vertebrae: No acute fracture. No primary bone lesion or focal pathologic process. Soft tissues and spinal canal: No prevertebral fluid or swelling. No visible canal hematoma. Disc levels: Cervical spondylitic disc space narrowing C4 through C7 with small posterior marginal osteophytes at C5-6 C6-7 contributing to mild neural foraminal narrowing. No significant central canal stenosis. Upper chest: Scarring at the apices. Other: Moderate atherosclerosis of the extracranial carotids. IMPRESSION: 1. Acute right-sided subdural hematoma overlying the convexity of the right temporal lobe with small focus of subarachnoid hemorrhage in the adjacent right parietal lobe. The hematoma measures up to 10 mm in thickness. No significant midline shift. Critical Value/emergent results were called by telephone at the time of interpretation on 12/26/2016 at 6:50 pm to Dr. Raeford Razor , who verbally acknowledged these results. 2. Atrophy with chronic moderate small vessel ischemic disease. 3. Right periorbital and forehead soft tissue contusion and swelling. No acute maxillofacial fracture. 4. Cervical spondylosis without acute posttraumatic cervical spine fracture or subluxation. Electronically Signed   By: Tollie Eth M.D.   On: 12/26/2016 18:52    Procedures Procedures (including critical care time)  CRITICAL CARE Performed by: Raeford Razor Total critical care time: 45 minutes Critical care time was exclusive of separately billable procedures and treating other patients. Critical care was necessary to treat or prevent imminent or life-threatening deterioration. Critical care was time spent personally by me on the following activities: development of treatment plan with patient and/or  surrogate as well as nursing, discussions with consultants, evaluation of patient's response to treatment, examination of patient, obtaining history from patient or surrogate, ordering and performing treatments and interventions, ordering and review of laboratory studies, ordering and review of radiographic studies, pulse oximetry and re-evaluation of patient's condition.   Medications Ordered in ED Medications  vancomycin (VANCOCIN) 2,000 mg in sodium chloride 0.9 % 500 mL IVPB (not administered)  piperacillin-tazobactam (ZOSYN) IVPB 3.375 g (3.375 g Intravenous New Bag/Given 12/26/16 1730)  sodium chloride 0.9 % bolus 2,721 mL (2,721 mLs Intravenous New Bag/Given 12/26/16 1730)  acetaminophen (TYLENOL) suppository 650 mg (650 mg Rectal Given 12/26/16 1730)     Initial Impression / Assessment and Plan / ED Course  I have reviewed the triage vital signs and the nursing notes.  Pertinent labs & imaging results that were available during my care of the patient were reviewed by me and considered in my medical decision making (see chart for details).     80yF found down at home. Last seen normal around 5p yesterday. Probably down at least several hours based on exam. Suspect infectious process underlying. Febrile. Confused. Lactic acid >4. Abx. IVF.  Likely aspirated. Vomited. Coughing. Rhonchi on R.   Hard to get good neuro exam because she doesn't participate much. Holding RUE flexed and resists passive extension of her arm. I think she may actually be guarding her shoulder? Does seem to  react in pain when I try to range it. Will move R hand and foot on command but inconsistently. No appreciable movement on L.    B/l periorbital ecchymosis and many areas of erythema anteriorly which are more consistent with prolonged pressure. CT head. Cannot clear neck clinically so will also image. Abdomen doesn't seem to be tender. No obvious reaction to passive ROM of either hip/LE.   Final Clinical  Impressions(s) / ED Diagnoses   Final diagnoses:  Severe sepsis (HCC)  Urinary tract infection without hematuria, site unspecified  Toxic encephalopathy  SDH (subdural hematoma) [facility])    ED Discharge Orders    None       Raeford Razor, MD 12/27/16 1552

## 2016-12-26 NOTE — H&P (Signed)
[facility]                                                                                    Patient Demographics  Alison Doyle, is a 81 y.o. female  CSN: [id number]  MRN: [id number]  DOB - Aug 23, 1935  Admit Date - 12/26/2016  Outpatient Primary MD for the patient is [name], MD   With History of -  Past Medical History:  Diagnosis Date  . Diabetes mellitus       Past Surgical History:  Procedure Laterality Date  . REPLACEMENT TOTAL KNEE Right 2001    in for   Chief Complaint  Patient presents with  . Altered Mental Status  . Fever     HPI  Alison Doyle  is a 81 y.o. female, with past medical history significant for diabetes mellitus and who resides home alone, was brought for evaluation for altered mental status and fever and probably fall. The patient was seen normal last at 5 PM yesterday, she was called by the son who came to visit her at home and he found her on the floor face down near her bed. In the emergency room the patient was confused, obtunded and could not give any significant history. She was found to have UTI and CT of the head showed acute subdural hematoma, presumably from a Fall . Neurosurgery was consulted. Patient was started on antibiotics with a diagnosis of urosepsis.    Review of Systems    Unable to obtain due to patient's condition  Social History Social History   Tobacco Use  . Smoking status: Current Every Day Smoker    Packs/day: 1.00    Types: Cigarettes  Substance Use Topics  . Alcohol use: No     Family History Unable to obtain due to patient's condition  Prior to Admission medications   Medication Sig Start Date End Date Taking? Authorizing Provider  aspirin EC 81 MG tablet Take 81 mg by mouth daily.   Yes [provider]  liraglutide (VICTOZA) 18 MG/3ML SOPN Inject 1.2 mg daily into the skin.    Yes [provider]  metFORMIN (GLUCOPHAGE) 850 MG tablet Take 850 mg by mouth 2 (two)  times daily with a meal.   Yes [provider]  pravastatin (PRAVACHOL) 20 MG tablet Take 20 mg by mouth at bedtime.   Yes [provider]  traMADol (ULTRAM) 50 MG tablet Take 50-100 mg every 4 (four) hours as needed by mouth (for pain/MAX OF 6 TABLETS IN 24 HOURS).    Yes [provider]    No Known Allergies  Physical Exam  Vitals  Blood pressure (!) 155/80, pulse 100, temperature (!) 101.9 F (38.8 C), temperature source Rectal, resp. rate (!) 32, height 5\' 6"  (1.676 m), weight 90.7 kg (200 lb), SpO2 100 %.   1. General well-developed, well-nourished female,   2. Unresponsive.  3. Obtunded.  4. Ears and Eyes bilateral periorbital ecchymosis, pupils equal round 3 mm bilaterally, sluggish response to light.  5. Supple Neck, No JVD, No cervical lymphadenopathy appriciated, No Carotid Bruits.  6. Symmetrical Chest wall movement, Good air movement bilaterally, CTAB.  7. RRR, No Gallops, Rubs or Murmurs, No Parasternal Heave.  8. Positive Bowel Sounds, Abdomen Soft, Non tender, No organomegaly appriciated,No rebound -guarding or rigidity.  9.  No Cyanosis, Normal Skin Turgor, multiple bruises.  10. Poor muscle tone,  joints appear normal , no effusions, Normal ROM.    Data Review  CBC Recent Labs  Lab 12/26/16 1653  WBC 13.8*  HGB 15.6*  HCT 47.8*  PLT 221  MCV 85.8  MCH 28.0  MCHC 32.6  RDW 14.4  LYMPHSABS 1.1  MONOABS 1.0  EOSABS 0.0  BASOSABS 0.0   ------------------------------------------------------------------------------------------------------------------  Chemistries  Recent Labs  Lab 12/26/16 1653  NA 137  K 4.2  CL 105  CO2 19*  GLUCOSE 329*  BUN 12  CREATININE 0.94  CALCIUM 8.6*  AST 39  ALT 15  ALKPHOS 97  BILITOT 0.8   ------------------------------------------------------------------------------------------------------------------ estimated creatinine clearance is 56 mL/min (by C-G formula based on SCr  of 0.94 mg/dL). ------------------------------------------------------------------------------------------------------------------ No results for input(s): TSH, T4TOTAL, T3FREE, THYROIDAB in the last 72 hours.  Invalid input(s): FREET3   Coagulation profile Recent Labs  Lab 12/26/16 1653  INR 1.13   ------------------------------------------------------------------------------------------------------------------- No results for input(s): DDIMER in the last 72 hours. -------------------------------------------------------------------------------------------------------------------  Cardiac Enzymes No results for input(s): CKMB, TROPONINI, MYOGLOBIN in the last 168 hours.  Invalid input(s): CK ------------------------------------------------------------------------------------------------------------------ Invalid input(s): POCBNP   ---------------------------------------------------------------------------------------------------------------  Urinalysis    Component Value Date/Time   COLORURINE AMBER (A) 12/26/2016 1653   APPEARANCEUR CLOUDY (A) 12/26/2016 1653   LABSPEC 1.027 12/26/2016 1653   PHURINE 5.0 12/26/2016 1653   GLUCOSEU >=500 (A) 12/26/2016 1653   HGBUR LARGE (A) 12/26/2016 1653   BILIRUBINUR NEGATIVE 12/26/2016 1653   KETONESUR 20 (A) 12/26/2016 1653   PROTEINUR >=300 (A) 12/26/2016 1653   UROBILINOGEN 0.2 11/29/2014 2049   NITRITE POSITIVE (A) 12/26/2016 1653   LEUKOCYTESUR NEGATIVE 12/26/2016 1653    ----------------------------------------------------------------------------------------------------------------  .   Imaging results:   Dg Shoulder Right  Result Date: 12/26/2016 CLINICAL DATA:  Pt c/o right-sided shoulder pain after falling in her home today. Pt is confused and has slurred speech. Best obtainable images due to pt condition. Axillary view was unobtainable. EXAM: RIGHT SHOULDER - 2+ VIEW COMPARISON:  None. FINDINGS: There is no acute  fracture or subluxation. Subacromial narrowing is noted. Bones appear osteopenic. The right lung apex is unremarkable in appearance. IMPRESSION: 1. Subacromial narrowing which can be associated with chronic rotator cuff injury. 2.  No evidence for acute  abnormality. Electronically Signed   By: Norva PavlovElizabeth  Brown M.D.   On: 12/26/2016 18:14   Ct Head Wo Contrast  Result Date: 12/26/2016 CLINICAL DATA:  Altered level of consciousness, incontinent of stool and urine with facial bruising and slurred speech. Right-sided facial droop. EXAM: CT HEAD WITHOUT CONTRAST CT MAXILLOFACIAL WITHOUT CONTRAST CT CERVICAL SPINE WITHOUT CONTRAST TECHNIQUE: Multidetector CT imaging of the head, cervical spine, and maxillofacial structures were performed using the standard protocol without intravenous contrast. Multiplanar CT image reconstructions of the cervical spine and maxillofacial structures were also generated. COMPARISON:  None. FINDINGS: CT HEAD FINDINGS BRAIN: There is sulcal and ventricular prominence consistent with superficial and central atrophy. There is an acute right-sided subdural hematoma overlying the periphery of the right temporal lobe measuring up to 10 mm in thickness with small subarachnoid focus of hemorrhage in the right parietal lobe. Chronic moderate small vessel ischemia of periventricular subcortical white matter. No intraparenchymal hemorrhage is noted. No midline shift is seen. No large vascular territory  infarct is noted. VASCULAR: Mild to moderate calcific atherosclerosis of the carotid siphons. SKULL: No skull fracture. No significant scalp soft tissue swelling. SINUSES/ORBITS: The mastoid air-cells are clear. Mild ethmoid sinus mucosal thickening. No air-fluid levels. The included ocular globes and orbital contents are non-suspicious. Bilateral lens replacements. OTHER: None. CT MAXILLOFACIAL FINDINGS Osseous: No acute maxillofacial fracture. Orbits: Intact orbits and globes. No retrobulbar  hematoma. Bilateral lens replacements. Extraocular muscles are symmetric in appearance. Sinuses: Mild ethmoid sinus mucosal thickening. Soft tissues: Right periorbital contusion with soft tissue swelling. Mild soft tissue swelling of the forehead. CT CERVICAL SPINE FINDINGS Alignment: Intact craniocervical relationship. Osteoarthritis of the atlantodental interval with subcortical cystic change of the axis in odontoid process. Maintained cervical lordosis. Skull base and vertebrae: No acute fracture. No primary bone lesion or focal pathologic process. Soft tissues and spinal canal: No prevertebral fluid or swelling. No visible canal hematoma. Disc levels: Cervical spondylitic disc space narrowing C4 through C7 with small posterior marginal osteophytes at C5-6 C6-7 contributing to mild neural foraminal narrowing. No significant central canal stenosis. Upper chest: Scarring at the apices. Other: Moderate atherosclerosis of the extracranial carotids. IMPRESSION: 1. Acute right-sided subdural hematoma overlying the convexity of the right temporal lobe with small focus of subarachnoid hemorrhage in the adjacent right parietal lobe. The hematoma measures up to 10 mm in thickness. No significant midline shift. Critical Value/emergent results were called by telephone at the time of interpretation on 12/26/2016 at 6:50 pm to Dr. Raeford Razor , who verbally acknowledged these results. 2. Atrophy with chronic moderate small vessel ischemic disease. 3. Right periorbital and forehead soft tissue contusion and swelling. No acute maxillofacial fracture. 4. Cervical spondylosis without acute posttraumatic cervical spine fracture or subluxation. Electronically Signed   By: Tollie Eth M.D.   On: 12/26/2016 18:52   Ct Cervical Spine Wo Contrast  Result Date: 12/26/2016 CLINICAL DATA:  Altered level of consciousness, incontinent of stool and urine with facial bruising and slurred speech. Right-sided facial droop. EXAM: CT HEAD  WITHOUT CONTRAST CT MAXILLOFACIAL WITHOUT CONTRAST CT CERVICAL SPINE WITHOUT CONTRAST TECHNIQUE: Multidetector CT imaging of the head, cervical spine, and maxillofacial structures were performed using the standard protocol without intravenous contrast. Multiplanar CT image reconstructions of the cervical spine and maxillofacial structures were also generated. COMPARISON:  None. FINDINGS: CT HEAD FINDINGS BRAIN: There is sulcal and ventricular prominence consistent with superficial and central atrophy. There is an acute right-sided subdural hematoma overlying the periphery of the right temporal lobe measuring up to 10 mm in thickness with small subarachnoid focus of hemorrhage in the right parietal lobe. Chronic moderate small vessel ischemia of periventricular subcortical white matter. No intraparenchymal hemorrhage is noted. No midline shift is seen. No large vascular territory infarct is noted. VASCULAR: Mild to moderate calcific atherosclerosis of the carotid siphons. SKULL: No skull fracture. No significant scalp soft tissue swelling. SINUSES/ORBITS: The mastoid air-cells are clear. Mild ethmoid sinus mucosal thickening. No air-fluid levels. The included ocular globes and orbital contents are non-suspicious. Bilateral lens replacements. OTHER: None. CT MAXILLOFACIAL FINDINGS Osseous: No acute maxillofacial fracture. Orbits: Intact orbits and globes. No retrobulbar hematoma. Bilateral lens replacements. Extraocular muscles are symmetric in appearance. Sinuses: Mild ethmoid sinus mucosal thickening. Soft tissues: Right periorbital contusion with soft tissue swelling. Mild soft tissue swelling of the forehead. CT CERVICAL SPINE FINDINGS Alignment: Intact craniocervical relationship. Osteoarthritis of the atlantodental interval with subcortical cystic change of the axis in odontoid process. Maintained cervical lordosis. Skull base and vertebrae: No acute fracture.  No primary bone lesion or focal pathologic process.  Soft tissues and spinal canal: No prevertebral fluid or swelling. No visible canal hematoma. Disc levels: Cervical spondylitic disc space narrowing C4 through C7 with small posterior marginal osteophytes at C5-6 C6-7 contributing to mild neural foraminal narrowing. No significant central canal stenosis. Upper chest: Scarring at the apices. Other: Moderate atherosclerosis of the extracranial carotids. IMPRESSION: 1. Acute right-sided subdural hematoma overlying the convexity of the right temporal lobe with small focus of subarachnoid hemorrhage in the adjacent right parietal lobe. The hematoma measures up to 10 mm in thickness. No significant midline shift. Critical Value/emergent results were called by telephone at the time of interpretation on 12/26/2016 at 6:50 pm to Dr. Raeford Razor , who verbally acknowledged these results. 2. Atrophy with chronic moderate small vessel ischemic disease. 3. Right periorbital and forehead soft tissue contusion and swelling. No acute maxillofacial fracture. 4. Cervical spondylosis without acute posttraumatic cervical spine fracture or subluxation. Electronically Signed   By: Tollie Eth M.D.   On: 12/26/2016 18:52   Dg Chest Portable 1 View  Result Date: 12/26/2016 CLINICAL DATA:  Altered level of consciousness, incontinent, slurred speech, RIGHT facial droop, posturing with RIGHT arm opinion, history diabetes mellitus, last seen normal 1600 hours yesterday, smoker EXAM: PORTABLE CHEST 1 VIEW COMPARISON:  Portable exam 1719 hours compared to 07/09/2011 FINDINGS: Enlargement of cardiac silhouette. Atherosclerotic calcification aorta. Mediastinal contours and pulmonary vascularity normal. Accentuated interstitial markings in the mid to lower lungs bilaterally question mild pulmonary edema. No pleural effusion or pneumothorax. Severe osseous demineralization with LEFT glenohumeral degenerative changes noted. IMPRESSION: Enlargement of cardiac silhouette with question mild diffuse  pulmonary edema. Electronically Signed   By: Ulyses Southward M.D.   On: 12/26/2016 17:34   Ct Maxillofacial Wo Contrast  Result Date: 12/26/2016 CLINICAL DATA:  Altered level of consciousness, incontinent of stool and urine with facial bruising and slurred speech. Right-sided facial droop. EXAM: CT HEAD WITHOUT CONTRAST CT MAXILLOFACIAL WITHOUT CONTRAST CT CERVICAL SPINE WITHOUT CONTRAST TECHNIQUE: Multidetector CT imaging of the head, cervical spine, and maxillofacial structures were performed using the standard protocol without intravenous contrast. Multiplanar CT image reconstructions of the cervical spine and maxillofacial structures were also generated. COMPARISON:  None. FINDINGS: CT HEAD FINDINGS BRAIN: There is sulcal and ventricular prominence consistent with superficial and central atrophy. There is an acute right-sided subdural hematoma overlying the periphery of the right temporal lobe measuring up to 10 mm in thickness with small subarachnoid focus of hemorrhage in the right parietal lobe. Chronic moderate small vessel ischemia of periventricular subcortical white matter. No intraparenchymal hemorrhage is noted. No midline shift is seen. No large vascular territory infarct is noted. VASCULAR: Mild to moderate calcific atherosclerosis of the carotid siphons. SKULL: No skull fracture. No significant scalp soft tissue swelling. SINUSES/ORBITS: The mastoid air-cells are clear. Mild ethmoid sinus mucosal thickening. No air-fluid levels. The included ocular globes and orbital contents are non-suspicious. Bilateral lens replacements. OTHER: None. CT MAXILLOFACIAL FINDINGS Osseous: No acute maxillofacial fracture. Orbits: Intact orbits and globes. No retrobulbar hematoma. Bilateral lens replacements. Extraocular muscles are symmetric in appearance. Sinuses: Mild ethmoid sinus mucosal thickening. Soft tissues: Right periorbital contusion with soft tissue swelling. Mild soft tissue swelling of the forehead. CT  CERVICAL SPINE FINDINGS Alignment: Intact craniocervical relationship. Osteoarthritis of the atlantodental interval with subcortical cystic change of the axis in odontoid process. Maintained cervical lordosis. Skull base and vertebrae: No acute fracture. No primary bone lesion or focal pathologic process. Soft tissues and  spinal canal: No prevertebral fluid or swelling. No visible canal hematoma. Disc levels: Cervical spondylitic disc space narrowing C4 through C7 with small posterior marginal osteophytes at C5-6 C6-7 contributing to mild neural foraminal narrowing. No significant central canal stenosis. Upper chest: Scarring at the apices. Other: Moderate atherosclerosis of the extracranial carotids. IMPRESSION: 1. Acute right-sided subdural hematoma overlying the convexity of the right temporal lobe with small focus of subarachnoid hemorrhage in the adjacent right parietal lobe. The hematoma measures up to 10 mm in thickness. No significant midline shift. Critical Value/emergent results were called by telephone at the time of interpretation on 12/26/2016 at 6:50 pm to Dr. Raeford Razor , who verbally acknowledged these results. 2. Atrophy with chronic moderate small vessel ischemic disease. 3. Right periorbital and forehead soft tissue contusion and swelling. No acute maxillofacial fracture. 4. Cervical spondylosis without acute posttraumatic cervical spine fracture or subluxation. Electronically Signed   By: Tollie Eth M.D.   On: 12/26/2016 18:52    My personal review of EKG: Sinus tachycardia with a rate of 10 3 bpm and ST depression in anterior leads and possible prolonged QT interval    Assessment & Plan  1. Sepsis 2. UTI 3. Subdural hematoma 4.DM  Plan  Admit to step down IV antibiotics, vancomycin and Zosyn IV fluids Check cultures Neurosurgery consult pending   DVT Prophylaxis SCDs  AM Labs Ordered, also please review Full Orders    Code Status full  Disposition Plan:  Undetermined  Time spent in minutes : 45 minutes  Condition critical   @SIGNATURE @

## 2016-12-26 NOTE — Progress Notes (Signed)
Pharmacy Antibiotic Note  Alison Doyle is a 81 y.o. female admitted on 12/26/2016 with sepsis.  Pharmacy has been consulted for vancomycin and zosyn dosing.  Patient presents with AMS s/p fall, found to have a subdural hematoma. UA positive with positive nitrites, many bacteria (6-30), and 0-5 squamous epithelial cells. Received loading dose of 2 g vancomycin and zosyn in ED. Temp 101.9 and WBC 13.8. Renal function is within normal limits (SCr 0.94; CrCl ~56 mL/min).  Plan: Vancomycin 750 mg IV every 12 hours.  Goal trough 15-20 mcg/mL. Zosyn 3.375g IV q8h (4 hour infusion).  Monitor culture results, clinical picture, renal function, VT as needed Recommend urine culture (not currently ordered)  Height: 5\' 6"  (167.6 cm) Weight: 200 lb (90.7 kg) IBW/kg (Calculated) : 59.3  Temp (24hrs), Avg:101.9 F (38.8 C), Min:101.9 F (38.8 C), Max:101.9 F (38.8 C)  Recent Labs  Lab 12/26/16 1653 12/26/16 1708 12/26/16 2053  WBC 13.8*  --   --   CREATININE 0.94  --   --   LATICACIDVEN  --  4.25* 1.63    Estimated Creatinine Clearance: 56 mL/min (by C-G formula based on SCr of 0.94 mg/dL).    No Known Allergies  Antimicrobials this admission: Zosyn 11/6 >>  Vancomycin 11/6 >>   Dose adjustments this admission: N/A  Microbiology results: 11/6 BCx: collected 11/6 MRSA PCR: collected  Thank you for allowing pharmacy to be a part of this patient's care.  Girard CooterKimberly Perkins, PharmD Clinical Pharmacist  Pager: [phone] Phone: [phone] 12/26/2016 9:22 PM

## 2016-12-26 NOTE — ED Triage Notes (Addendum)
Per EMS, pt from home, pt was found around 1600 today by her son after she would not answer her phone. Pt was LSN yesterday afternoon at 1600. Pt in and out of consciousness on scene and alert to voice here. Pt found to have 2 soiled briefs on, incontenent of stool and urine, has bruising all over face and eyes and skin tears throughout. Pt has slurred speech, right sided facial droop and appears to be posturing her right arm up and in. Pt placed on a NRB in route.

## 2016-12-26 NOTE — Progress Notes (Signed)
Pt admitted to 2C02 from ED. Upon assessment pt had expiratory wheezing / Rhonci in all 4 lung fields, with RR 30-40's. IV fluids D5 1/2 NS @ 75. Blood sugar finger stick 343. MD paged. Verbal order received with read back for stat portable CXR, Discontinue IV fluids and 60 mg lasix IV once. MD came up to see patient. Will continue to monitor.

## 2016-12-26 NOTE — ED Notes (Signed)
RN and EDP notified of critical lactic

## 2016-12-27 ENCOUNTER — Inpatient Hospital Stay (HOSPITAL_COMMUNITY): Payer: Medicare Other

## 2016-12-27 DIAGNOSIS — I63131 Cerebral infarction due to embolism of right carotid artery: Secondary | ICD-10-CM

## 2016-12-27 LAB — BLOOD CULTURE ID PANEL (REFLEXED)

## 2016-12-27 LAB — GLUCOSE, CAPILLARY
GLUCOSE-CAPILLARY: 273 mg/dL — AB (ref 65–99)
GLUCOSE-CAPILLARY: 260 mg/dL — AB (ref 65–99)
GLUCOSE-CAPILLARY: 244 mg/dL — AB (ref 65–99)
Glucose-Capillary: 201 mg/dL — ABNORMAL HIGH (ref 65–99)

## 2016-12-27 LAB — CBC
HCT: 45.5 % (ref 36.0–46.0)
HEMOGLOBIN: 14.7 g/dL (ref 12.0–15.0)
MCH: 27.8 pg (ref 26.0–34.0)
MCHC: 32.3 g/dL (ref 30.0–36.0)
MCV: 86.2 fL (ref 78.0–100.0)
Platelets: 189 10*3/uL (ref 150–400)
RBC: 5.28 MIL/uL — ABNORMAL HIGH (ref 3.87–5.11)
RDW: 14.4 % (ref 11.5–15.5)
WBC: 13.6 10*3/uL — ABNORMAL HIGH (ref 4.0–10.5)

## 2016-12-27 LAB — COMPREHENSIVE METABOLIC PANEL
ALBUMIN: 2.9 g/dL — AB (ref 3.5–5.0)
ALK PHOS: 81 U/L (ref 38–126)
ALT: 17 U/L (ref 14–54)
ANION GAP: 10 (ref 5–15)
AST: 43 U/L — ABNORMAL HIGH (ref 15–41)
BUN: 10 mg/dL (ref 6–20)
CALCIUM: 8.1 mg/dL — AB (ref 8.9–10.3)
CO2: 22 mmol/L (ref 22–32)
CREATININE: 0.83 mg/dL (ref 0.44–1.00)
Chloride: 105 mmol/L (ref 101–111)
GFR calc Af Amer: >60 mL/min (ref >60)
GFR calc non Af Amer: >60 mL/min (ref >60)
GLUCOSE: 274 mg/dL — AB (ref 65–99)
Potassium: 3 mmol/L — ABNORMAL LOW (ref 3.5–5.1)
SODIUM: 137 mmol/L (ref 135–145)
Total Bilirubin: 0.9 mg/dL (ref 0.3–1.2)
Total Protein: 5.9 g/dL — ABNORMAL LOW (ref 6.5–8.1)

## 2016-12-27 MED ORDER — SODIUM CHLORIDE 0.9 % IV SOLN
INTRAVENOUS | Status: DC
  Administered 2016-12-27 – 2016-12-30 (×5): via INTRAVENOUS

## 2016-12-27 MED ORDER — VANCOMYCIN HCL IN DEXTROSE 750-5 MG/150ML-% IV SOLN
750.0000 mg | Freq: Two times a day (BID) | INTRAVENOUS | Status: DC
  Administered 2016-12-27 – 2016-12-30 (×6): 750 mg via INTRAVENOUS
  Filled 2016-12-27 (×7): qty 150

## 2016-12-27 MED ORDER — ZOLPIDEM TARTRATE 5 MG PO TABS
5.0000 mg | ORAL_TABLET | Freq: Every evening | ORAL | Status: DC | PRN
  Administered 2016-12-27 – 2017-01-03 (×4): 5 mg via ORAL
  Filled 2016-12-27 (×4): qty 1

## 2016-12-27 MED ORDER — DILTIAZEM HCL 25 MG/5ML IV SOLN
10.0000 mg | Freq: Once | INTRAVENOUS | Status: AC
  Administered 2016-12-27: 10 mg via INTRAVENOUS
  Filled 2016-12-27: qty 5

## 2016-12-27 MED ORDER — ACETAMINOPHEN 500 MG PO TABS
500.0000 mg | ORAL_TABLET | Freq: Four times a day (QID) | ORAL | Status: DC | PRN
  Administered 2016-12-28 – 2016-12-30 (×4): 500 mg via ORAL
  Filled 2016-12-27 (×5): qty 1

## 2016-12-27 MED ORDER — RESOURCE THICKENUP CLEAR PO POWD
ORAL | Status: DC | PRN
  Filled 2016-12-27 (×2): qty 125

## 2016-12-27 MED ORDER — IOPAMIDOL (ISOVUE-370) INJECTION 76%
INTRAVENOUS | Status: AC
  Filled 2016-12-27: qty 50

## 2016-12-27 MED ORDER — METOPROLOL TARTRATE 5 MG/5ML IV SOLN
5.0000 mg | Freq: Once | INTRAVENOUS | Status: AC
  Administered 2016-12-27: 5 mg via INTRAVENOUS
  Filled 2016-12-27: qty 5

## 2016-12-27 MED ORDER — IPRATROPIUM-ALBUTEROL 0.5-2.5 (3) MG/3ML IN SOLN
3.0000 mL | Freq: Four times a day (QID) | RESPIRATORY_TRACT | Status: DC
Start: 2016-12-27 — End: 2016-12-29
  Administered 2016-12-27 – 2016-12-28 (×6): 3 mL via RESPIRATORY_TRACT
  Filled 2016-12-27 (×6): qty 3

## 2016-12-27 MED ORDER — IOPAMIDOL (ISOVUE-370) INJECTION 76%
INTRAVENOUS | Status: AC
  Administered 2016-12-27: 50 mL
  Filled 2016-12-27: qty 50

## 2016-12-27 MED ORDER — DILTIAZEM HCL 25 MG/5ML IV SOLN: 10.0000 mg | Freq: Once | INTRAVENOUS | Status: DC

## 2016-12-27 MED ORDER — DILTIAZEM HCL 30 MG PO TABS: 30.0000 mg | ORAL_TABLET | Freq: Three times a day (TID) | ORAL | Status: DC

## 2016-12-27 MED ORDER — DILTIAZEM HCL 100 MG IV SOLR
5.0000 mg/h | INTRAVENOUS | Status: DC
  Administered 2016-12-27: 10 mg/h via INTRAVENOUS
  Administered 2016-12-27 – 2016-12-28 (×2): 5 mg/h via INTRAVENOUS
  Filled 2016-12-27 (×3): qty 100

## 2016-12-27 NOTE — Progress Notes (Signed)
Inpatient Diabetes Program Recommendations  AACE/ADA: New Consensus Statement on Inpatient Glycemic Control (2015)  Target Ranges:  Prepandial:   less than 140 mg/dL      Peak postprandial:   less than 180 mg/dL (1-2 hours)      Critically ill patients:  140 - 180 mg/dL   Lab Results  Component Value Date   GLUCAP 273 (H) 12/27/2016    Review of Glycemic ControlResults for Karma GanjaYNE, Berenise C (MRN [id number]) as of 12/27/2016 09:43  Ref. Range 12/26/2016 16:45 12/26/2016 21:28 12/27/2016 07:24  Glucose-Capillary Latest Ref Range: 65 - 99 mg/dL 829339 (H) 562343 (H) 130273 (H)   Diabetes history: Type 2 diabetes Outpatient Diabetes medications: Victoza 1.2 mg daily, Metformin 850 mg bid Current orders for Inpatient glycemic control:  Novolog sensitive tid with meals and HS  Inpatient Diabetes Program Recommendations:   Please add Lantus 20 units daily while int the hospital.  Also please check A1C to determine pre-hospitalization glycemic control.  Thanks, Beryl MeagerJenny Bill Yohn, RN, BC-ADM Inpatient Diabetes Coordinator Pager [phone] (8a-5p)

## 2016-12-27 NOTE — Consult Note (Signed)
Requesting Physician: Dr. Mahala Menghini    Chief Complaint: Code stroke  History obtained from:   Chart     HPI:                                                                                                                                         Alison Doyle is an 81 y.o. female with PMH of DM was admitted for right SDH. She was thought to be normal on Monday around 4- 5pm when she spoke to her son. On Tuesday she did not answer the phone and found down by her son. She was brought to the ER and found to have left side weakness and CT head demonstrated a right subdural hemorrhage with small subarachnoid hemorrhage likely traumatic with  secondary to her fall. The was admitted to medicine team and on evaluation today by Neurosurgery was noted to be hemiplegic on left side, with gaze deviation and and neglect out of proportion to weakness.  A stroke alert was called.   Date last known well: Date: 12/25/2016 Time last known well: Unable to determine tPA Given: No: SDH Modified Rankin: Rankin Score=3  Intracerebral Hemorrhage (ICH) Score Total:  0  Past Medical History:  Diagnosis Date  . Diabetes mellitus     Past Surgical History:  Procedure Laterality Date  . REPLACEMENT TOTAL KNEE Right 2001    History reviewed. No pertinent family history. Social History:  reports that she has been smoking cigarettes.  She has been smoking about 1.00 pack per day. She does not have any smokeless tobacco history on file. She reports that she does not drink alcohol or use drugs.  Allergies: No Known Allergies  Medications:                                                                                                                           Current Facility-Administered Medications  Medication Dose Route Frequency Provider Last Rate Last Dose  . 0.9 %  sodium chloride infusion   Intravenous Continuous Rhetta Mura, MD 100 mL/hr at 12/27/16 0749    . diltiazem (CARDIZEM) 100 mg in dextrose 5  % 100 mL (1 mg/mL) infusion  5-15 mg/hr Intravenous Titrated Rhetta Mura, MD 7.5 mL/hr at 12/27/16 0947 7.5 mg/hr at 12/27/16 0947  . insulin aspart (novoLOG) injection 0-5 Units  0-5 Units Subcutaneous QHS Carron Curie,  MD   5 Units at 12/26/16 2210  . insulin aspart (novoLOG) injection 0-9 Units  0-9 Units Subcutaneous TID WC Hijazi, Karie Mainland, MD      . ipratropium-albuterol (DUONEB) 0.5-2.5 (3) MG/3ML nebulizer solution 3 mL  3 mL Nebulization Q6H Samtani, Jai-Gurmukh, MD      . MEDLINE mouth rinse  15 mL Mouth Rinse BID Carron Curie, MD   15 mL at 12/27/16 0111  . ondansetron (ZOFRAN) tablet 4 mg  4 mg Oral Q6H PRN Carron Curie, MD       Or  . ondansetron (ZOFRAN) injection 4 mg  4 mg Intravenous Q6H PRN Carron Curie, MD   4 mg at 12/27/16 1003  . RESOURCE THICKENUP CLEAR   Oral PRN Rhetta Mura, MD         ROS:                                                                                                                                       History obtained from unobtainable from patient due to mental status    Neurologic Examination:                                                                                                      Blood pressure 121/69, pulse (!) 106, temperature 99 F (37.2 C), temperature source Oral, resp. rate (!) 25, height 5\' 8"  (1.727 m), weight 96 kg (211 lb 10.3 oz), SpO2 98 %.  HEENT-  Normocephalic, no lesions, without obvious abnormality.  Normal external eye and conjunctiva.  Normal TM's bilaterally.  Normal auditory canals and external ears. Normal external nose, mucus membranes and septum.  Normal pharynx. Cardiovascular- A-fib Lungs- chest clear, no wheezing, rales, normal symmetric air entry Abdomen- normal findings: bowel sounds normal Extremities- no edema Lymph-no adenopathy palpable Musculoskeletal-no joint tenderness, deformity or swelling Skin-warm and dry, no hyperpigmentation, vitiligo, or suspicious lesions  Neurological  Examination Mental Status:  Cranial Nerves: II: Discs flat bilaterally; Visual fields grossly normal,  III,IV, VI: ptosis not present, extra-ocular motions intact bilaterally, pupils equal, round, reactive to light and accommodation V,VII: smile symmetric, facial light touch sensation normal bilaterally VIII: hearing normal bilaterally IX,X: uvula rises symmetrically XI: bilateral shoulder shrug XII: midline tongue extension Motor: Right : Upper extremity   5/5    Left:     Upper extremity   5/5  Lower extremity   5/5     Lower extremity   5/5 Tone and bulk:normal tone throughout; no  atrophy noted Sensory: Pinprick and light touch intact throughout, bilaterally Deep Tendon Reflexes: 2+ and symmetric throughout Plantars: Right: downgoing   Left: downgoing Cerebellar: normal finger-to-nose, normal rapid alternating movements and normal heel-to-shin test Gait: normal gait and station       Lab Results: Basic Metabolic Panel: Recent Labs  Lab 12/26/16 1653 12/27/16 0232  NA 137 137  K 4.2 3.0*  CL 105 105  CO2 19* 22  GLUCOSE 329* 274*  BUN 12 10  CREATININE 0.94 0.83  CALCIUM 8.6* 8.1*    Liver Function Tests: Recent Labs  Lab 12/26/16 1653 12/27/16 0232  AST 39 43*  ALT 15 17  ALKPHOS 97 81  BILITOT 0.8 0.9  PROT 6.7 5.9*  ALBUMIN 3.3* 2.9*   No results for input(s): LIPASE, AMYLASE in the last 168 hours. No results for input(s): AMMONIA in the last 168 hours.  CBC: Recent Labs  Lab 12/26/16 1653 12/27/16 0232  WBC 13.8* 13.6*  NEUTROABS 11.7*  --   HGB 15.6* 14.7  HCT 47.8* 45.5  MCV 85.8 86.2  PLT 221 189    Cardiac Enzymes: Recent Labs  Lab 12/26/16 2156  CKTOTAL 2,293*    Lipid Panel: No results for input(s): CHOL, TRIG, HDL, CHOLHDL, VLDL, LDLCALC in the last 168 hours.  CBG: Recent Labs  Lab 12/26/16 1645 12/26/16 2128 12/27/16 0724  GLUCAP 339* 343* 273*    Microbiology: Results for orders placed or performed during the  hospital encounter of 12/26/16  MRSA PCR Screening     Status: None   Collection Time: 12/26/16  9:13 PM  Result Value Ref Range Status   MRSA by PCR NEGATIVE NEGATIVE Final    Comment:        The GeneXpert MRSA Assay (FDA approved for NASAL specimens only), is one component of a comprehensive MRSA colonization surveillance program. It is not intended to diagnose MRSA infection nor to guide or monitor treatment for MRSA infections.     Coagulation Studies: Recent Labs    12/26/16 1653  LABPROT 14.4  INR 1.13    Imaging: Dg Shoulder Right  Result Date: 12/26/2016 CLINICAL DATA:  Pt c/o right-sided shoulder pain after falling in her home today. Pt is confused and has slurred speech. Best obtainable images due to pt condition. Axillary view was unobtainable. EXAM: RIGHT SHOULDER - 2+ VIEW COMPARISON:  None. FINDINGS: There is no acute fracture or subluxation. Subacromial narrowing is noted. Bones appear osteopenic. The right lung apex is unremarkable in appearance. IMPRESSION: 1. Subacromial narrowing which can be associated with chronic rotator cuff injury. 2.  No evidence for acute  abnormality. Electronically Signed   By: Norva Pavlov M.D.   On: 12/26/2016 18:14   Ct Head Wo Contrast  Result Date: 12/26/2016 CLINICAL DATA:  Altered level of consciousness, incontinent of stool and urine with facial bruising and slurred speech. Right-sided facial droop. EXAM: CT HEAD WITHOUT CONTRAST CT MAXILLOFACIAL WITHOUT CONTRAST CT CERVICAL SPINE WITHOUT CONTRAST TECHNIQUE: Multidetector CT imaging of the head, cervical spine, and maxillofacial structures were performed using the standard protocol without intravenous contrast. Multiplanar CT image reconstructions of the cervical spine and maxillofacial structures were also generated. COMPARISON:  None. FINDINGS: CT HEAD FINDINGS BRAIN: There is sulcal and ventricular prominence consistent with superficial and central atrophy. There is an acute  right-sided subdural hematoma overlying the periphery of the right temporal lobe measuring up to 10 mm in thickness with small subarachnoid focus of hemorrhage in the right parietal lobe. Chronic  moderate small vessel ischemia of periventricular subcortical white matter. No intraparenchymal hemorrhage is noted. No midline shift is seen. No large vascular territory infarct is noted. VASCULAR: Mild to moderate calcific atherosclerosis of the carotid siphons. SKULL: No skull fracture. No significant scalp soft tissue swelling. SINUSES/ORBITS: The mastoid air-cells are clear. Mild ethmoid sinus mucosal thickening. No air-fluid levels. The included ocular globes and orbital contents are non-suspicious. Bilateral lens replacements. OTHER: None. CT MAXILLOFACIAL FINDINGS Osseous: No acute maxillofacial fracture. Orbits: Intact orbits and globes. No retrobulbar hematoma. Bilateral lens replacements. Extraocular muscles are symmetric in appearance. Sinuses: Mild ethmoid sinus mucosal thickening. Soft tissues: Right periorbital contusion with soft tissue swelling. Mild soft tissue swelling of the forehead. CT CERVICAL SPINE FINDINGS Alignment: Intact craniocervical relationship. Osteoarthritis of the atlantodental interval with subcortical cystic change of the axis in odontoid process. Maintained cervical lordosis. Skull base and vertebrae: No acute fracture. No primary bone lesion or focal pathologic process. Soft tissues and spinal canal: No prevertebral fluid or swelling. No visible canal hematoma. Disc levels: Cervical spondylitic disc space narrowing C4 through C7 with small posterior marginal osteophytes at C5-6 C6-7 contributing to mild neural foraminal narrowing. No significant central canal stenosis. Upper chest: Scarring at the apices. Other: Moderate atherosclerosis of the extracranial carotids. IMPRESSION: 1. Acute right-sided subdural hematoma overlying the convexity of the right temporal lobe with small focus of  subarachnoid hemorrhage in the adjacent right parietal lobe. The hematoma measures up to 10 mm in thickness. No significant midline shift. Critical Value/emergent results were called by telephone at the time of interpretation on 12/26/2016 at 6:50 pm to Dr. Raeford RazorSTEPHEN KOHUT , who verbally acknowledged these results. 2. Atrophy with chronic moderate small vessel ischemic disease. 3. Right periorbital and forehead soft tissue contusion and swelling. No acute maxillofacial fracture. 4. Cervical spondylosis without acute posttraumatic cervical spine fracture or subluxation. Electronically Signed   By: Tollie Ethavid  Kwon M.D.   On: 12/26/2016 18:52   Ct Cervical Spine Wo Contrast  Result Date: 12/26/2016 CLINICAL DATA:  Altered level of consciousness, incontinent of stool and urine with facial bruising and slurred speech. Right-sided facial droop. EXAM: CT HEAD WITHOUT CONTRAST CT MAXILLOFACIAL WITHOUT CONTRAST CT CERVICAL SPINE WITHOUT CONTRAST TECHNIQUE: Multidetector CT imaging of the head, cervical spine, and maxillofacial structures were performed using the standard protocol without intravenous contrast. Multiplanar CT image reconstructions of the cervical spine and maxillofacial structures were also generated. COMPARISON:  None. FINDINGS: CT HEAD FINDINGS BRAIN: There is sulcal and ventricular prominence consistent with superficial and central atrophy. There is an acute right-sided subdural hematoma overlying the periphery of the right temporal lobe measuring up to 10 mm in thickness with small subarachnoid focus of hemorrhage in the right parietal lobe. Chronic moderate small vessel ischemia of periventricular subcortical white matter. No intraparenchymal hemorrhage is noted. No midline shift is seen. No large vascular territory infarct is noted. VASCULAR: Mild to moderate calcific atherosclerosis of the carotid siphons. SKULL: No skull fracture. No significant scalp soft tissue swelling. SINUSES/ORBITS: The mastoid  air-cells are clear. Mild ethmoid sinus mucosal thickening. No air-fluid levels. The included ocular globes and orbital contents are non-suspicious. Bilateral lens replacements. OTHER: None. CT MAXILLOFACIAL FINDINGS Osseous: No acute maxillofacial fracture. Orbits: Intact orbits and globes. No retrobulbar hematoma. Bilateral lens replacements. Extraocular muscles are symmetric in appearance. Sinuses: Mild ethmoid sinus mucosal thickening. Soft tissues: Right periorbital contusion with soft tissue swelling. Mild soft tissue swelling of the forehead. CT CERVICAL SPINE FINDINGS Alignment: Intact craniocervical relationship. Osteoarthritis of  the atlantodental interval with subcortical cystic change of the axis in odontoid process. Maintained cervical lordosis. Skull base and vertebrae: No acute fracture. No primary bone lesion or focal pathologic process. Soft tissues and spinal canal: No prevertebral fluid or swelling. No visible canal hematoma. Disc levels: Cervical spondylitic disc space narrowing C4 through C7 with small posterior marginal osteophytes at C5-6 C6-7 contributing to mild neural foraminal narrowing. No significant central canal stenosis. Upper chest: Scarring at the apices. Other: Moderate atherosclerosis of the extracranial carotids. IMPRESSION: 1. Acute right-sided subdural hematoma overlying the convexity of the right temporal lobe with small focus of subarachnoid hemorrhage in the adjacent right parietal lobe. The hematoma measures up to 10 mm in thickness. No significant midline shift. Critical Value/emergent results were called by telephone at the time of interpretation on 12/26/2016 at 6:50 pm to Dr. Raeford RazorSTEPHEN KOHUT , who verbally acknowledged these results. 2. Atrophy with chronic moderate small vessel ischemic disease. 3. Right periorbital and forehead soft tissue contusion and swelling. No acute maxillofacial fracture. 4. Cervical spondylosis without acute posttraumatic cervical spine fracture  or subluxation. Electronically Signed   By: Tollie Ethavid  Kwon M.D.   On: 12/26/2016 18:52   Dg Chest Port 1 View  Result Date: 12/26/2016 CLINICAL DATA:  81 y/o  F; wheezing. EXAM: PORTABLE CHEST 1 VIEW COMPARISON:  12/26/2016 chest radiograph.  12/08/2005 CT chest FINDINGS: Stable normal cardiac silhouette. Aortic atherosclerosis with calcification. Stable diffusely increased reticular markings of the lungs. No focal consolidation identified. No pleural effusion or pneumothorax. No acute osseous abnormality is evident. IMPRESSION: Stable diffusely increased reticular markings of the lungs probably representing interstitial edema and peribronchial thickening. No focal consolidation identified. Aortic atherosclerosis. Electronically Signed   By: Mitzi HansenLance  Furusawa-Stratton M.D.   On: 12/26/2016 22:37   Dg Chest Portable 1 View  Result Date: 12/26/2016 CLINICAL DATA:  Altered level of consciousness, incontinent, slurred speech, RIGHT facial droop, posturing with RIGHT arm opinion, history diabetes mellitus, last seen normal 1600 hours yesterday, smoker EXAM: PORTABLE CHEST 1 VIEW COMPARISON:  Portable exam 1719 hours compared to 07/09/2011 FINDINGS: Enlargement of cardiac silhouette. Atherosclerotic calcification aorta. Mediastinal contours and pulmonary vascularity normal. Accentuated interstitial markings in the mid to lower lungs bilaterally question mild pulmonary edema. No pleural effusion or pneumothorax. Severe osseous demineralization with LEFT glenohumeral degenerative changes noted. IMPRESSION: Enlargement of cardiac silhouette with question mild diffuse pulmonary edema. Electronically Signed   By: Ulyses SouthwardMark  Boles M.D.   On: 12/26/2016 17:34   Ct Maxillofacial Wo Contrast  Result Date: 12/26/2016 CLINICAL DATA:  Altered level of consciousness, incontinent of stool and urine with facial bruising and slurred speech. Right-sided facial droop. EXAM: CT HEAD WITHOUT CONTRAST CT MAXILLOFACIAL WITHOUT CONTRAST CT  CERVICAL SPINE WITHOUT CONTRAST TECHNIQUE: Multidetector CT imaging of the head, cervical spine, and maxillofacial structures were performed using the standard protocol without intravenous contrast. Multiplanar CT image reconstructions of the cervical spine and maxillofacial structures were also generated. COMPARISON:  None. FINDINGS: CT HEAD FINDINGS BRAIN: There is sulcal and ventricular prominence consistent with superficial and central atrophy. There is an acute right-sided subdural hematoma overlying the periphery of the right temporal lobe measuring up to 10 mm in thickness with small subarachnoid focus of hemorrhage in the right parietal lobe. Chronic moderate small vessel ischemia of periventricular subcortical white matter. No intraparenchymal hemorrhage is noted. No midline shift is seen. No large vascular territory infarct is noted. VASCULAR: Mild to moderate calcific atherosclerosis of the carotid siphons. SKULL: No skull fracture. No significant  scalp soft tissue swelling. SINUSES/ORBITS: The mastoid air-cells are clear. Mild ethmoid sinus mucosal thickening. No air-fluid levels. The included ocular globes and orbital contents are non-suspicious. Bilateral lens replacements. OTHER: None. CT MAXILLOFACIAL FINDINGS Osseous: No acute maxillofacial fracture. Orbits: Intact orbits and globes. No retrobulbar hematoma. Bilateral lens replacements. Extraocular muscles are symmetric in appearance. Sinuses: Mild ethmoid sinus mucosal thickening. Soft tissues: Right periorbital contusion with soft tissue swelling. Mild soft tissue swelling of the forehead. CT CERVICAL SPINE FINDINGS Alignment: Intact craniocervical relationship. Osteoarthritis of the atlantodental interval with subcortical cystic change of the axis in odontoid process. Maintained cervical lordosis. Skull base and vertebrae: No acute fracture. No primary bone lesion or focal pathologic process. Soft tissues and spinal canal: No prevertebral fluid  or swelling. No visible canal hematoma. Disc levels: Cervical spondylitic disc space narrowing C4 through C7 with small posterior marginal osteophytes at C5-6 C6-7 contributing to mild neural foraminal narrowing. No significant central canal stenosis. Upper chest: Scarring at the apices. Other: Moderate atherosclerosis of the extracranial carotids. IMPRESSION: 1. Acute right-sided subdural hematoma overlying the convexity of the right temporal lobe with small focus of subarachnoid hemorrhage in the adjacent right parietal lobe. The hematoma measures up to 10 mm in thickness. No significant midline shift. Critical Value/emergent results were called by telephone at the time of interpretation on 12/26/2016 at 6:50 pm to Dr. Raeford Razor , who verbally acknowledged these results. 2. Atrophy with chronic moderate small vessel ischemic disease. 3. Right periorbital and forehead soft tissue contusion and swelling. No acute maxillofacial fracture. 4. Cervical spondylosis without acute posttraumatic cervical spine fracture or subluxation. Electronically Signed   By: Tollie Eth M.D.   On: 12/26/2016 18:52    ASSESSMENT AND PLAN  24 y F with DM presents with left hemiplegia initially thought to be secondary to tDSH from her fall. However it is very likely she had right MCA stroke and fell after having the stroke resulting in the subdural hemorrhage. CT head shows large right parietal infarction.  The patient did not receive tPA as she is outside window and she is not a candidate for MT as she has already completed her stroke. Etiology of her stroke is likely from Atrial fibrillation which is a new diagnosis.   Acute Ischemic Stroke - R Middle cerebral artery infarction New onset Atrial fibrillation Traumatic SDH and SAH    Recommend #Carotid US #HOLD ASA as patient has a right subdural hemorrhage  #Start or continue Atorvastatin 80 mg/other high intensity statin # BP goal: less than 140/90 as patient has  #  HBAIC and Lipid profile # Telemetry monitoring # Frequent neuro checks #stroke swallow screen  New onset Atrial fibrillation Do not anticoagulate now, will need to be on OAC eventually  tSDH and tSAH Management per neurosurgery   Please page stroke NP  Or  PA  Or MD from 8am -4 pm  as this patient from this time will be  followed by the stroke.   You can look them up on [email or web address]  Password [facility]   Georgiana Spinner Aroor MD [facility] [phone]  If 7pm to 7am, please call on call as listed on AMION.

## 2016-12-27 NOTE — Progress Notes (Signed)
PROGRESS NOTE    Alison Doyle  BJY:[id number] DOB: 1935/04/08 DOA: 12/26/2016 PCP: Merri BrunettePharr, Walter, MD   Specialists:  none   Brief Narrative:   81 year old female Diabetes mellitus type 2 Osteoarthritis Hyperlipidemia Prior cholecystitis status post ERCP 2007  Right total knee replacement  Recently treated in emergency room 08/31/2016 for urinary tract infection with Keflex     Recently treated in emergency room 08/31/2016 for urinary tract infection with Keflex  Admitted to hospital 12/26/2016 with altered mental status probable fall found last normal 5 PM of 11/6 face down, lactic acid was >4 and she was febrile --noted to have some slurred speech and right-sided facial droop and some posturing of the right arm UTI as well as subdural hematoma was found and she was started on antibiotics for altered mental status presumably because of both  Developed new onset atrial fibrillation with RVR over the course of the night on admission    Assessment & Plan:   Active Problems:   Sepsis (HCC)   Subdural hematoma as well as fall  Neurosurgery input is pending regarding need for operative management note that she is  not on any anticoagulation only on aspirin 81  Etiology of fall is unclear, ? Afib vs other Cardic etiology  would cycle troponins although may be elevated falsely  Keep on telemetry and monitor in stepdown unit, normal saline as below  I have put out a call to neurosurgery as the patient does have some new neurological findings on the left side and is unable to move left upper and lower extremity whereas she previously was able to do so and this may require surgical intervention as per neurosurgeon   Atrial fibrillation with RVR, new onset as of 12/26/2016 Chads score >3  Will get echocardiogram to rule out any intra-chamber clot   May need anticoagulation going forward however with the fall she also has a high risk  bleed and so would need to defer the same until  felt safe  Diarrhea Etiology is unclear, patient might have C. difficile given exposure to antibiotics in the emergency room 08/2016 so we will check the same We will also get fecal lactoferrin and if these tests are negative may consider antimotility agent  Possible sepsis with lactic acidosis of 4 White count on admission 13.8  Received IV vancomycin and Zosyn on admission  Continue IV saline 100 cc/h after bolus 2 L have been given  Unclear if sepsis was lost for fall versus not hard to obtain history, echo as above, cycle  troponins as above although may be altered because of new onset A. Fib.  Diabetes mellitus type 2  On metformin at home-could contribute to lactic acidosis, holding liraglutide  N.p.o. status for now until much more awake and pending neurosurgeon input    Current daily smoker  With assess when more awake alert  Chronic low back pain and osteoarthritis  holding tramadol 50-100 every 4 as needed  Hyperlipidemia  For now hold Pravachol 20 at bedtime    Place on SCD-hold Lovenoex until seen Inpatient on stepdown   Consultants:   None  Procedures:   None  Antimicrobials:   Vancomycin/Zosyn 11/7   Subjective:  Somnolent rousable  No distress but usually is ambulatory around home and needs minimal assit Son reports a couple of months of weakness and diarrhea--NO recent urinary symp Has a lady go out to visit patient and was last seen normal 12/25/2016 Has had no recent falls but has been  feeling dizzy for the past couple of weeks and reported this to her son Patient was allegedly found down on the ground having passed stool but had vomited prior to getting out of bed cannot recall much about the incidence of this  Objective: Vitals:   12/27/16 0609 12/27/16 0617 12/27/16 0657 12/27/16 0721  BP:    121/69  Pulse: (!) 125 (!) 134 98 (!) 106  Resp: (!) 23 (!) 26 (!) 24 (!) 25  Temp:    99 F (37.2 C)  TempSrc:    Oral  SpO2: 99% 98% 99% 98%    Weight:      Height:        Intake/Output Summary (Last 24 hours) at 12/27/2016 0731 Last data filed at 12/27/2016 0543 Gross per 24 hour  Intake 3521 ml  Output 1900 ml  Net 1621 ml   Filed Weights   12/26/16 1651 12/26/16 2127  Weight: 90.7 kg (200 lb) 96 kg (211 lb 10.3 oz)    Examination:  Alert eyes open raccoon eyes over her right eye Extraocular movements are intact however some twisting of the mouth to the right Power is limited to the right upper extremity although she is able to move it with passive range of motion she has pain in the elbow and in the shoulder-she has only 1 or 2/5 power in the left upper and lower extremityand is better able to oppose motion on the left side Reflexes are brisk bilaterally lower extremities and brachial radialis S1-S2 tachycardic no murmur rub or gallop Abdomen is soft nontender no rebound no guarding  Data Reviewed: I have personally reviewed following labs and imaging studies  CBC: Recent Labs  Lab 12/26/16 1653 12/27/16 0232  WBC 13.8* 13.6*  NEUTROABS 11.7*  --   HGB 15.6* 14.7  HCT 47.8* 45.5  MCV 85.8 86.2  PLT 221 189   Basic Metabolic Panel: Recent Labs  Lab 12/26/16 1653 12/27/16 0232  NA 137 137  K 4.2 3.0*  CL 105 105  CO2 19* 22  GLUCOSE 329* 274*  BUN 12 10  CREATININE 0.94 0.83  CALCIUM 8.6* 8.1*   GFR: Estimated Creatinine Clearance: 67.6 mL/min (by C-G formula based on SCr of 0.83 mg/dL). Liver Function Tests: Recent Labs  Lab 12/26/16 1653 12/27/16 0232  AST 39 43*  ALT 15 17  ALKPHOS 97 81  BILITOT 0.8 0.9  PROT 6.7 5.9*  ALBUMIN 3.3* 2.9*   No results for input(s): LIPASE, AMYLASE in the last 168 hours. No results for input(s): AMMONIA in the last 168 hours. Coagulation Profile: Recent Labs  Lab 12/26/16 1653  INR 1.13   Cardiac Enzymes: Recent Labs  Lab 12/26/16 2156  CKTOTAL 2,293*   BNP (last 3 results) No results for input(s): PROBNP in the last 8760 hours. HbA1C: No  results for input(s): HGBA1C in the last 72 hours. CBG: Recent Labs  Lab 12/26/16 1645 12/26/16 2128  GLUCAP 339* 343*   Lipid Profile: No results for input(s): CHOL, HDL, LDLCALC, TRIG, CHOLHDL, LDLDIRECT in the last 72 hours. Thyroid Function Tests: No results for input(s): TSH, T4TOTAL, FREET4, T3FREE, THYROIDAB in the last 72 hours. Anemia Panel: No results for input(s): VITAMINB12, FOLATE, FERRITIN, TIBC, IRON, RETICCTPCT in the last 72 hours. Urine analysis:    Component Value Date/Time   COLORURINE AMBER (A) 12/26/2016 1653   APPEARANCEUR CLOUDY (A) 12/26/2016 1653   LABSPEC 1.027 12/26/2016 1653   PHURINE 5.0 12/26/2016 1653   GLUCOSEU >=500 (A) 12/26/2016  1653   HGBUR LARGE (A) 12/26/2016 1653   BILIRUBINUR NEGATIVE 12/26/2016 1653   KETONESUR 20 (A) 12/26/2016 1653   PROTEINUR >=300 (A) 12/26/2016 1653   UROBILINOGEN 0.2 11/29/2014 2049   NITRITE POSITIVE (A) 12/26/2016 1653   LEUKOCYTESUR NEGATIVE 12/26/2016 1653     Radiology Studies: Reviewed images personally in health database    Scheduled Meds: . albuterol  2.5 mg Nebulization Q6H  . diltiazem  30 mg Oral Q8H  . insulin aspart  0-15 Units Subcutaneous TID WC  . insulin aspart  0-5 Units Subcutaneous QHS  . insulin aspart  0-9 Units Subcutaneous TID WC  . ipratropium  0.5 mg Nebulization Q6H  . mouth rinse  15 mL Mouth Rinse BID  . metoprolol tartrate  5 mg Intravenous Once   Continuous Infusions: . piperacillin-tazobactam (ZOSYN)  IV 3.375 g (12/27/16 0543)  . vancomycin Stopped (12/27/16 0657)     LOS: 1 day    Time spent: 58    Pleas Koch, MD [facility]   If 7PM-7AM, please contact night-coverage [email or web address] Password [facility] 12/27/2016, 7:31 AM

## 2016-12-27 NOTE — Progress Notes (Signed)
Modified Barium Swallow Progress Note  Patient Details  Name: Alison Doyle MRN: [id number] Date of Birth: April 08, 1935  Today's Date: 12/27/2016  Modified Barium Swallow completed.  Full report located under Chart Review in the Imaging Section.  Brief recommendations include the following:  Clinical Impression  Labial/facial and lingual weakness led to anterior spillage, decreased bolus cohesion with sublingual spill and delayed oral transit. Pharyngeal impairments primarily due to decreased sensation and decreased timing of protective mechanisms and swallow initiating at the pyriforms with penetration noted before and during the swallow with all liquid textures (excepts teaspoon honey) without pt sensing. Thin and cup sip honey barium reached the vocal cords silently and verbal cue to cough moved penetrates  higher in vestibule but unable to clear. Mild vallecuar and pyriform sinus initially progressing to valleculae (min) only as study progressed. She is at high aspiration risk given oropharyngeal impairments as well as cognitive deficits. Recommend initiaton of Dys 1 and honey thick liquids vis SPOON only, crush meds, full supervision and assist and small bites. ST will continue.     Swallow Evaluation Recommendations       SLP Diet Recommendations: Dysphagia 1 (Puree) solids;Honey thick liquids   Liquid Administration via: Spoon   Medication Administration: Crushed with puree   Supervision: Patient able to self feed;Full supervision/cueing for compensatory strategies   Compensations: Minimize environmental distractions;Slow rate;Small sips/bites   Postural Changes: Seated upright at 90 degrees   Oral Care Recommendations: Oral care BID        Alison Doyle, Alison Doyle 12/27/2016,11:40 AM   Alison Doyle Alison Doyle

## 2016-12-27 NOTE — Consult Note (Addendum)
BP 121/69 (BP Location: Left Arm)   Pulse (!) 106   Temp 99 F (37.2 C) (Oral)   Resp (!) 25   Ht 5\' 8"  (1.727 m)   Wt 96 kg (211 lb 10.3 oz)   SpO2 98%   BMI 32.18 kg/m  Alison Doyle was admitted last night at ~1700 after being found down in her bedroom by her son. She was last known well ~1700 12/25/2016. She was not moving her left side at that time according to her son. She was found between her bed, and the wall, lying on her face. She had multiple bruises over her face, and extremities, periorbital ecchymoses were also evident. Her son states she is neurologically normal at baseline. After being admitted a Head ct showed a small subdural hematoma lateral to the left temporal lobe, no shift evident, basal cisterns are widely patent. There is an order for the neurosurgery consult, but our group was not contacted. This am due to the lack of a consult note by us, we were called once more. The patient was receiving a swallow study, and was noted to have problems swallowing. Upon my initial exam the patient was moving the left upper extremity minimally, the left lower extremity not at all. I instructed the nurse to call a code stroke due to the left sided plegia which is not explained by the Ct done last night.  No Known Allergies Prior to Admission medications   Medication Sig Start Date End Date Taking? Authorizing Provider  aspirin EC 81 MG tablet Take 81 mg by mouth daily.   Yes [provider]  liraglutide (VICTOZA) 18 MG/3ML SOPN Inject 1.2 mg daily into the skin.    Yes [provider]  metFORMIN (GLUCOPHAGE) 850 MG tablet Take 850 mg by mouth 2 (two) times daily with a meal.   Yes [provider]  pravastatin (PRAVACHOL) 20 MG tablet Take 20 mg by mouth at bedtime.   Yes [provider]  traMADol (ULTRAM) 50 MG tablet Take 50-100 mg every 4 (four) hours as needed by mouth (for pain/MAX OF 6 TABLETS IN 24 HOURS).    Yes [provider]   Past  Surgical History:  Procedure Laterality Date  . REPLACEMENT TOTAL KNEE Right 2001   Past Medical History:  Diagnosis Date  . Diabetes mellitus    History reviewed. No pertinent family history. Social History   Socioeconomic History  . Marital status: Widowed    Spouse name: Not on file  . Number of children: Not on file  . Years of education: Not on file  . Highest education level: Not on file  Social Needs  . Financial resource strain: Not on file  . Food insecurity - worry: Not on file  . Food insecurity - inability: Not on file  . Transportation needs - medical: Not on file  . Transportation needs - non-medical: Not on file  Occupational History  . Not on file  Tobacco Use  . Smoking status: Current Every Day Smoker    Packs/day: 1.00    Types: Cigarettes  Substance and Sexual Activity  . Alcohol use: No  . Drug use: No  . Sexual activity: Not on file  Other Topics Concern  . Not on file  Social History Narrative  . Not on file   Physical Exam  Constitutional: She appears well-developed. She appears lethargic. She appears distressed.  HENT:  Multiple bruises on face, periorbitally  Eyes: EOM are normal. Pupils are equal,  round, and reactive to light.  Neck: Normal range of motion.  Cardiovascular: Normal rate and normal heart sounds.  Pulmonary/Chest: Effort normal and breath sounds normal.  Neurological: She appears lethargic. A cranial nerve deficit and sensory deficit is present.  Arouses to voice, will follow commands, knows name, place, her son, situation Perrl, full eom Symmetric facial sensation, left facial droop Speech is dysarthric Decreased light touch left torso No voluntary movement left side, moves right side to command Poor coordination Gait not assessed  Skin: Skin is dry. Bruising and ecchymosis noted.  Multiple bruises over her extremities both upper and lower. Knee brace which she wears was in place covering the left knee was in place when  I examined her. I removed the cloth and rubber brace when I examined her.    Assessment/Plan This 81 yo woman with a small non surgical acute intracranial hematoma in the right middle fossa, with acute onset left sided plegia admitted for altered mental status, and hematoma. I called a code stroke this am after examining the patient and obtaining history and verification of her lack of movement on 11/06 when found by her son.  CT scan this am shows a left mca infarct which is acute. This is the reason for the fall most likely and her current exam and her exam upon admission last night. The Stroke team will lead her neurologic care. The repeat ct showed no change in the subdural hematoma. There are no surgical indications at this time.

## 2016-12-27 NOTE — Progress Notes (Addendum)
PHARMACY - PHYSICIAN COMMUNICATION CRITICAL VALUE ALERT - BLOOD CULTURE IDENTIFICATION (BCID)  Results for orders placed or performed during the hospital encounter of 12/26/16  Blood Culture ID Panel (Reflexed) (Collected: 12/26/2016  5:28 PM)  Result Value Ref Range   Enterococcus species NOT DETECTED NOT DETECTED   Listeria monocytogenes NOT DETECTED NOT DETECTED   Staphylococcus species DETECTED (A) NOT DETECTED   Staphylococcus aureus NOT DETECTED NOT DETECTED   Methicillin resistance DETECTED (A) NOT DETECTED   Streptococcus species NOT DETECTED NOT DETECTED   Streptococcus agalactiae NOT DETECTED NOT DETECTED   Streptococcus pneumoniae NOT DETECTED NOT DETECTED   Streptococcus pyogenes NOT DETECTED NOT DETECTED   Acinetobacter baumannii NOT DETECTED NOT DETECTED   Enterobacteriaceae species NOT DETECTED NOT DETECTED   Enterobacter cloacae complex NOT DETECTED NOT DETECTED   Escherichia coli NOT DETECTED NOT DETECTED   Klebsiella oxytoca NOT DETECTED NOT DETECTED   Klebsiella pneumoniae NOT DETECTED NOT DETECTED   Proteus species NOT DETECTED NOT DETECTED   Serratia marcescens NOT DETECTED NOT DETECTED   Haemophilus influenzae NOT DETECTED NOT DETECTED   Neisseria meningitidis NOT DETECTED NOT DETECTED   Pseudomonas aeruginosa NOT DETECTED NOT DETECTED   Candida albicans NOT DETECTED NOT DETECTED   Candida glabrata NOT DETECTED NOT DETECTED   Candida krusei NOT DETECTED NOT DETECTED   Candida parapsilosis NOT DETECTED NOT DETECTED   Candida tropicalis NOT DETECTED NOT DETECTED    Name of physician (or Provider) Contacted: Dr. Antionette Charpyd   Changes to prescribed antibiotics required: CONS now present in 2 separate blood culture isolates, although drawn from same arm. Will restart vancomycin 750 mg IV every 12 hours.   Pollyann SamplesAndy Avion Kutzer, PharmD, BCPS 12/27/2016, 5:00 PM

## 2016-12-27 NOTE — Progress Notes (Signed)
Pt HR Afib 130-140's. EKG done, Afib RVR. Md paged. Received verbal order with read back for 10 mg Cardizem IV stat once and to call back if no change. Will continue to monitor.

## 2016-12-27 NOTE — Progress Notes (Signed)
IV team started 20G in the RFA. CT is notified

## 2016-12-27 NOTE — Evaluation (Signed)
Clinical/Bedside Swallow Evaluation Patient Details  Name: Alison Doyle MRN: [id number] Date of Birth: April 12, 1935  Today's Date: 12/27/2016 Time: SLP Start Time (ACUTE ONLY): 0920 SLP Stop Time (ACUTE ONLY): 0937 SLP Time Calculation (min) (ACUTE ONLY): 17 min  Past Medical History:  Past Medical History:  Diagnosis Date  . Diabetes mellitus    Past Surgical History:  Past Surgical History:  Procedure Laterality Date  . REPLACEMENT TOTAL KNEE Right 2001   HPI:  [name]o.female,with past medical history significant for diabetes mellitus was brought for evaluation for altered mental status, fever and suspected fall. Found to have UTI and CT of the head showedevolving acute to subacute posterior right MCA infarct involving the posterior right insula, right temporal and parietal lobes, stable right subdural hematoma since yesterday. Trace associated right temporal lobe subarachnoid hemorrhage. CXR enlargement of cardiac silhouette with question mild diffuse pulmonary edema.    Assessment / Plan / Recommendation Clinical Impression  Pt exhibits labial leakage due to left labial/facial weakness and difficulty forming adequate labial seal around cup. Suspect laryngeal penetration and/or aspiration given intermittent cough with thin water. Given clinical indicators and acute CVA pt is at high aspiration risk with objective assessment recommended. MBS scheduled this morning.  SLP Visit Diagnosis: Dysphagia, unspecified (R13.10)    Aspiration Risk  Moderate aspiration risk    Diet Recommendation NPO        Other  Recommendations Oral Care Recommendations: Oral care QID   Follow up Recommendations        Frequency and Duration            Prognosis        Swallow Study   General HPI: [name].o.female,with past medical history significant for diabetes mellitus was brought for evaluation for altered mental status, fever and suspected fall. Found to  have UTI and CT of the head showedevolving acute to subacute posterior right MCA infarct involving the posterior right insula, right temporal and parietal lobes, stable right subdural hematoma since yesterday. Trace associated right temporal lobe subarachnoid hemorrhage. CXR enlargement of cardiac silhouette with question mild diffuse pulmonary edema.  Type of Study: MBS-Modified Barium Swallow Study Previous Swallow Assessment: (none found) Diet Prior to this Study: NPO Temperature Spikes Noted: Yes Respiratory Status: Nasal cannula History of Recent Intubation: No Behavior/Cognition: Lethargic/Drowsy;Cooperative;Requires cueing Oral Cavity Assessment: Dry Oral Care Completed by SLP: Yes Oral Cavity - Dentition: Dentures, not available(always eats with dentures) Vision: Impaired for self-feeding Self-Feeding Abilities: Needs assist;Needs set up Patient Positioning: Upright in bed Baseline Vocal Quality: Low vocal intensity Volitional Cough: Weak Volitional Swallow: Able to elicit    Oral/Motor/Sensory Function     Ice Chips Ice chips: Not tested   Thin Liquid Thin Liquid: Impaired Presentation: Cup Oral Phase Impairments: Reduced labial seal Oral Phase Functional Implications: Left anterior spillage Pharyngeal  Phase Impairments: Cough - Immediate    Nectar Thick Nectar Thick Liquid: Not tested   Honey Thick Honey Thick Liquid: Not tested   Puree Puree: Impaired Presentation: Spoon Oral Phase Impairments: Reduced labial seal   Solid   GO   Solid: Not tested        Alison MacadamiaLitaker, Alison Doyle 12/27/2016,11:11 AM   Alison CoonsLisa Doyle Lonell FaceLitaker M.Ed [employer]-SLP Pager [phone]

## 2016-12-27 NOTE — Code Documentation (Signed)
81[facility] for sepsis, UTI, SDH and DM.  Per report she was LKW on 12/25/2016 at 1600 when her son spoke with her on the phone.  Patient was not answering the phone yesterday so her son went to check on her and found her on the floor.  Patient was brought to the ED where she was found to have acute right SDH on CT.  Patient with new left sided weakness.  Neurosurgeon consulted on patient this morning and code stroke was activated.  Stroke team to the bedside.  Patient transported to CT.  CT completed.  NIHSS 20, see documentation for details and code stroke times.  Patient with right gaze preference, left hemiplegia, sensory deficit and neglect on exam.  CT showing evolving acute to subacute posterior right MCA infarct involving the posterior right insula, right temporal and parietal lobes. Stable right subdural hematoma since yesterday. Trace associated right temporal lobe subarachnoid hemorrhage. Patient is outside the window for treatment with tPA.  Patient is not an endovascular treatment candidate.  Patient transported back to 2C02.  Patient to have stroke workup.  Bedside handoff with 2C RN Christy.

## 2016-12-27 NOTE — Progress Notes (Addendum)
Called to see patient who was admitted last night for altered mental status, acute subdural hematoma right middle fossa, plegia on left side. Note states that neurosurgery was consulted. There is no documentation of the neurosurgeon, and my partner states he was not called last night about this patient. The ED note is not final, and is still incomplete, there is no mention of the neurosurgeon being consulted.although there is an order at 1926, we were not contacted.

## 2016-12-27 NOTE — Progress Notes (Signed)
OT Cancellation Note  Patient Details Name: Alison Doyle MRN: [id number] DOB: Nov 09, 1935   Cancelled Treatment:    Reason Eval/Treat Not Completed: Patient not medically ready. Pt with orders for strict bedrest. Will check back as appropriate.  Doristine Sectionharity A Yuleni Burich, MS OTR/L  Pager: [phone]   Doristine SectionCharity A Loye Reininger 12/27/2016, 3:55 PM

## 2016-12-27 NOTE — Progress Notes (Signed)
Clinical Social Worker acknowledge consult for SNF placement. CSW awaiting PT/OT note to proceed with SNF placement.   Alison MoodAshley  Doyle, MSW,  [employer]CSWA [phone]

## 2016-12-28 ENCOUNTER — Inpatient Hospital Stay (HOSPITAL_COMMUNITY): Payer: Medicare Other

## 2016-12-28 ENCOUNTER — Encounter (HOSPITAL_COMMUNITY): Payer: Self-pay

## 2016-12-28 DIAGNOSIS — E119 Type 2 diabetes mellitus without complications: Secondary | ICD-10-CM

## 2016-12-28 DIAGNOSIS — I480 Paroxysmal atrial fibrillation: Secondary | ICD-10-CM | POA: Diagnosis present

## 2016-12-28 DIAGNOSIS — I63411 Cerebral infarction due to embolism of right middle cerebral artery: Secondary | ICD-10-CM | POA: Diagnosis present

## 2016-12-28 DIAGNOSIS — S064X9A Epidural hemorrhage with loss of consciousness of unspecified duration, initial encounter: Secondary | ICD-10-CM | POA: Diagnosis present

## 2016-12-28 DIAGNOSIS — S064XAA Epidural hemorrhage with loss of consciousness status unknown, initial encounter: Secondary | ICD-10-CM | POA: Diagnosis present

## 2016-12-28 DIAGNOSIS — M25512 Pain in left shoulder: Secondary | ICD-10-CM | POA: Diagnosis present

## 2016-12-28 DIAGNOSIS — M25511 Pain in right shoulder: Secondary | ICD-10-CM | POA: Diagnosis present

## 2016-12-28 DIAGNOSIS — I6330 Cerebral infarction due to thrombosis of unspecified cerebral artery: Secondary | ICD-10-CM

## 2016-12-28 DIAGNOSIS — I340 Nonrheumatic mitral (valve) insufficiency: Secondary | ICD-10-CM

## 2016-12-28 DIAGNOSIS — I609 Nontraumatic subarachnoid hemorrhage, unspecified: Secondary | ICD-10-CM

## 2016-12-28 LAB — RENAL FUNCTION PANEL
Albumin: 2.7 g/dL — ABNORMAL LOW (ref 3.5–5.0)
Anion gap: 7 (ref 5–15)
BUN: 14 mg/dL (ref 6–20)
CO2: 24 mmol/L (ref 22–32)
Calcium: 8.3 mg/dL — ABNORMAL LOW (ref 8.9–10.3)
Chloride: 106 mmol/L (ref 101–111)
Creatinine, Ser: 0.74 mg/dL (ref 0.44–1.00)
GFR calc Af Amer: >60 mL/min (ref >60)
GFR calc non Af Amer: >60 mL/min (ref >60)
Glucose, Bld: 219 mg/dL — ABNORMAL HIGH (ref 65–99)
Phosphorus: 3.2 mg/dL (ref 2.5–4.6)
Potassium: 3 mmol/L — ABNORMAL LOW (ref 3.5–5.1)
Sodium: 137 mmol/L (ref 135–145)

## 2016-12-28 LAB — HEMOGLOBIN A1C
Hgb A1c MFr Bld: 8.7 % — ABNORMAL HIGH (ref 4.8–5.6)
Mean Plasma Glucose: 202.99 mg/dL

## 2016-12-28 LAB — LIPID PANEL
CHOL/HDL RATIO: 4.4 ratio
CHOLESTEROL: 141 mg/dL (ref 0–200)
HDL: 32 mg/dL — ABNORMAL LOW (ref >40)
LDL Cholesterol: 69 mg/dL (ref 0–99)
TRIGLYCERIDES: 202 mg/dL — AB (ref <150)
VLDL: 40 mg/dL (ref 0–40)

## 2016-12-28 LAB — CBC
HCT: 43.5 % (ref 36.0–46.0)
HEMOGLOBIN: 14 g/dL (ref 12.0–15.0)
MCH: 28.4 pg (ref 26.0–34.0)
MCHC: 32.2 g/dL (ref 30.0–36.0)
MCV: 88.2 fL (ref 78.0–100.0)
Platelets: 155 10*3/uL (ref 150–400)
RBC: 4.93 MIL/uL (ref 3.87–5.11)
RDW: 14.7 % (ref 11.5–15.5)
WBC: 10.2 10*3/uL (ref 4.0–10.5)

## 2016-12-28 LAB — VITAMIN B12: VITAMIN B 12: 128 pg/mL — AB (ref 180–914)

## 2016-12-28 LAB — GLUCOSE, CAPILLARY
GLUCOSE-CAPILLARY: 208 mg/dL — AB (ref 65–99)
GLUCOSE-CAPILLARY: 172 mg/dL — AB (ref 65–99)
Glucose-Capillary: 231 mg/dL — ABNORMAL HIGH (ref 65–99)
Glucose-Capillary: 314 mg/dL — ABNORMAL HIGH (ref 65–99)

## 2016-12-28 LAB — MAGNESIUM: Magnesium: 1.7 mg/dL (ref 1.7–2.4)

## 2016-12-28 LAB — ECHOCARDIOGRAM COMPLETE
HEIGHTINCHES: 68 in
Weight: 3386.27 oz

## 2016-12-28 MED ORDER — INSULIN GLARGINE 100 UNIT/ML ~~LOC~~ SOLN
7.0000 [IU] | Freq: Every day | SUBCUTANEOUS | Status: DC
  Administered 2016-12-28 – 2017-01-04 (×8): 7 [IU] via SUBCUTANEOUS
  Filled 2016-12-28 (×8): qty 0.07

## 2016-12-28 MED ORDER — HEPARIN SODIUM (PORCINE) 5000 UNIT/ML IJ SOLN
5000.0000 [IU] | Freq: Three times a day (TID) | INTRAMUSCULAR | Status: DC
  Administered 2016-12-28 – 2017-01-04 (×19): 5000 [IU] via SUBCUTANEOUS
  Filled 2016-12-28 (×15): qty 1

## 2016-12-28 MED ORDER — PRAVASTATIN SODIUM 40 MG PO TABS
80.0000 mg | ORAL_TABLET | Freq: Every day | ORAL | Status: DC
  Administered 2016-12-28 – 2017-01-03 (×7): 80 mg via ORAL
  Filled 2016-12-28 (×7): qty 2

## 2016-12-28 MED ORDER — DILTIAZEM HCL ER COATED BEADS 180 MG PO CP24
180.0000 mg | ORAL_CAPSULE | Freq: Every day | ORAL | Status: DC
  Administered 2016-12-28 – 2017-01-04 (×8): 180 mg via ORAL
  Filled 2016-12-28 (×8): qty 1

## 2016-12-28 MED ORDER — PRAVASTATIN SODIUM 40 MG PO TABS: 20.0000 mg | ORAL_TABLET | Freq: Every day | ORAL | Status: DC

## 2016-12-28 MED ORDER — POTASSIUM CHLORIDE CRYS ER 20 MEQ PO TBCR
40.0000 meq | EXTENDED_RELEASE_TABLET | Freq: Every day | ORAL | Status: AC
  Administered 2016-12-28 – 2017-01-01 (×5): 40 meq via ORAL
  Filled 2016-12-28 (×5): qty 2

## 2016-12-28 NOTE — Progress Notes (Signed)
PT Cancellation Note  Patient Details Name: Alison GanjaJudy C Doyle MRN: [id number] DOB: [birth year]   Cancelled Treatment:    Reason Eval/Treat Not Completed: Patient at procedure or test/unavailable. Pt off floor for MRI. Will follow-up for PT evaluation as time allows.  [name], PT, DPT Acute Rehab Services  Pager: [phone]  [name] 12/28/2016, 3:39 PM

## 2016-12-28 NOTE — Progress Notes (Addendum)
  Speech Language Pathology Treatment: Dysphagia  Patient Details Name: Alison Doyle MRN: [id number] DOB: 11-11-1935 Today's Date: 12/28/2016 Time: [id number] SLP Time Calculation (min) (ACUTE ONLY): 12 min  Assessment / Plan / Recommendation Clinical Impression  Pt alert this morning, stated reason for hospital admission. RN reported pt has been swallowing honey thick liquids and puree without observable signs of difficulty. No s/s this morning with honey thick oj via teaspoon and pudding. Needed mod-max reminders for cough after every 2-3 bites/sips. Son has not brought dentures for trial of higher consistency solids but plans to get them from her house today. Continue honey thick via spoon, Dys 1, needed total assist for feeding (complains of pain in right arm- RN reports pt will be getting xray of arm), cough after 2-3 bites/sips and crush pills. MD, pt needs speech-language-cognitive evaluation-please order if agree.    HPI HPI: [name] [id number] y.o.female,with past medical history significant for diabetes mellitus was brought for evaluation for altered mental status, fever and suspected fall. Found to have UTI and CT of the head showedevolving acute to subacute posterior right MCA infarct involving the posterior right insula, right temporal and parietal lobes, stable right subdural hematoma since yesterday. Trace associated right temporal lobe subarachnoid hemorrhage. CXR enlargement of cardiac silhouette with question mild diffuse pulmonary edema.       SLP Plan  Continue with current plan of care       Recommendations  Diet recommendations: Dysphagia 1 (puree);Honey-thick liquid Liquids provided via: Teaspoon Medication Administration: Crushed with puree Supervision: Staff to assist with self feeding;Full supervision/cueing for compensatory strategies Compensations: Slow rate;Small sips/bites;Hard cough after swallow Postural Changes and/or Swallow Maneuvers: Seated upright 90  degrees                General recommendations: Rehab consult Oral Care Recommendations: Oral care BID Follow up Recommendations: Inpatient Rehab SLP Visit Diagnosis: Dysphagia, oropharyngeal phase (R13.12) Plan: Continue with current plan of care       GO                [name], Karesa Maultsby Willis 12/28/2016, 9:03 AM   Breck CoonsLisa Willis Lonell FaceLitaker M.Ed [employer]-SLP Pager [phone]

## 2016-12-28 NOTE — Progress Notes (Signed)
  Echocardiogram 2D Echocardiogram has been performed.  Quantia Grullon T Donja Tipping 12/28/2016, 11:41 AM

## 2016-12-28 NOTE — Progress Notes (Signed)
BP 107/65 (BP Location: Left Arm)   Pulse 95   Temp 98.4 F (36.9 C) (Oral)   Resp (!) 23   Ht 5\' 8"  (1.727 m)   Wt 96 kg (211 lb 10.3 oz)   SpO2 98%   BMI 32.18 kg/m  Alert and oriented Plegic on left side Moving right side well, following commands. Much more alert this morning. Complaining of pain in the back and upper extremities

## 2016-12-28 NOTE — Progress Notes (Signed)
Initial Nutrition Assessment  DOCUMENTATION CODES:   Obesity unspecified  INTERVENTION:   -Magic Cup TID with meals -Hormel Shake TID with meals  NUTRITION DIAGNOSIS:   Inadequate oral intake related to dysphagia as evidenced by meal completion < 50%.  GOAL:   Patient will meet greater than or equal to 90% of their needs  MONITOR:   PO intake, Supplement acceptance, Diet advancement, Labs, Weight trends, Skin, I & O's  REASON FOR ASSESSMENT:   Low Braden    ASSESSMENT:   Alison HeysJudy Doyle  is a 81 y.o. female, with past medical history significant for diabetes mellitus and who resides home alone, was brought for evaluation for altered mental status and fever and probably fall.  Pt admitted with AMS, s/p fall and sepsis. Neurosurgery consulted for subdural hematoma.   Pt out of room at time of visit. No family members present to provide additional hx.   Pt underwent MBSS on 12/27/16; pt was advanced to a dysphagia 1 diet with honey thick liquids. Case discussed with RN, who reports food intake is minimal, but pt is drinking fluids constantly. Per RN, almost entire container of thickener has been used this shift due to thickening beverages. Tray at bedside was unattempted. Documented meal completion 0-10%. Plan for SLP to reassess for potential diet upgrade, as son will bring in dentures from home. Suspect intake may increase once diet is upgraded (per RN, pt does not care for pureed textures).  Labs reviewed: CBGS: 314 (inpatient orders for glycemic control are 0-5 units insulin aspart daily at bedtime, 0-9 units insulin aspart TID with meals, 7 units insulin glargine daily).  NUTRITION - FOCUSED PHYSICAL EXAM:  Unable to obtain at this time  Diet Order:  DIET - DYS 1 Room service appropriate? Yes; Fluid consistency: Honey Thick  EDUCATION NEEDS:   No education needs have been identified at this time  Skin:  Skin Assessment: Reviewed RN Assessment  Last BM:   12/28/16  Height:   Ht Readings from Last 1 Encounters:  12/26/16 5\' 8"  (1.727 m)    Weight:   Wt Readings from Last 1 Encounters:  12/26/16 211 lb 10.3 oz (96 kg)    Ideal Body Weight:  63.6 kg  BMI:  Body mass index is 32.18 kg/m.  Estimated Nutritional Needs:   Kcal:  1700-1900  Protein:  95-110 grams  Fluid:  1.7-1.9 L    Alison Doyle, RD, LDN, CDE Pager: [phone] After hours Pager: [phone]

## 2016-12-28 NOTE — Progress Notes (Signed)
Patient transferred to unit via low bed.  Patient and son oriented to the room. IV fluids infusing. Orders reviewed. Call light in place. Telemetry applied. Will monitor.  Alison LaineKimberly Coretta Leisey RN

## 2016-12-28 NOTE — Progress Notes (Signed)
STROKE TEAM PROGRESS NOTE   SUBJECTIVE (INTERVAL HISTORY) No family is at the bedside.  Overall her condition is stable. She has right facial ecchymoses. Complains of left shoulder pain when I tried to move her right arm. She still has left hemianopia, left neglect and left hemiplegia.    OBJECTIVE Temp:  [98 F (36.7 C)-99.7 F (37.6 C)] 99.5 F (37.5 C) (11/08 1620) Pulse Rate:  [60-95] 95 (11/08 0327) Cardiac Rhythm: Atrial fibrillation (11/08 0754) Resp:  [19-35] 23 (11/08 0327) BP: (91-130)/(63-76) 130/65 (11/08 1150) SpO2:  [96 %-100 %] 98 % (11/08 1307)  Recent Labs  Lab 12/27/16 1619 12/27/16 2118 12/28/16 0754 12/28/16 1146 12/28/16 1629  GLUCAP 244* 201* 231* 314* 208*   Recent Labs  Lab 12/26/16 1653 12/27/16 0232 12/28/16 0233 12/28/16 0600  NA 137 137 137  --   K 4.2 3.0* 3.0*  --   CL 105 105 106  --   CO2 19* 22 24  --   GLUCOSE 329* 274* 219*  --   BUN 12 10 14   --   CREATININE 0.94 0.83 0.74  --   CALCIUM 8.6* 8.1* 8.3*  --   MG  --   --   --  1.7  PHOS  --   --  3.2  --    Recent Labs  Lab 12/26/16 1653 12/27/16 0232 12/28/16 0233  AST 39 43*  --   ALT 15 17  --   ALKPHOS 97 81  --   BILITOT 0.8 0.9  --   PROT 6.7 5.9*  --   ALBUMIN 3.3* 2.9* 2.7*   Recent Labs  Lab 12/26/16 1653 12/27/16 0232 12/28/16 0233  WBC 13.8* 13.6* 10.2  NEUTROABS 11.7*  --   --   HGB 15.6* 14.7 14.0  HCT 47.8* 45.5 43.5  MCV 85.8 86.2 88.2  PLT 221 189 155   Recent Labs  Lab 12/26/16 2156  CKTOTAL 2,293*   Recent Labs    12/26/16 1653  LABPROT 14.4  INR 1.13   Recent Labs    12/26/16 1653  COLORURINE AMBER*  LABSPEC 1.027  PHURINE 5.0  GLUCOSEU >=500*  HGBUR LARGE*  BILIRUBINUR NEGATIVE  KETONESUR 20*  PROTEINUR >=300*  NITRITE POSITIVE*  LEUKOCYTESUR NEGATIVE       Component Value Date/Time   CHOL 141 12/28/2016 0642   TRIG 202 (H) 12/28/2016 0642   HDL 32 (L) 12/28/2016 0642   CHOLHDL 4.4 12/28/2016 0642   VLDL 40  12/28/2016 0642   LDLCALC 69 12/28/2016 0642   Lab Results  Component Value Date   HGBA1C 8.7 (H) 12/28/2016   No results found for: LABOPIA, COCAINSCRNUR, LABBENZ, AMPHETMU, THCU, LABBARB  No results for input(s): ETH in the last 168 hours.  I have personally reviewed the radiological images below and agree with the radiology interpretations.  Ct Angio Head and neck W Or Wo Contrast 12/27/2016 IMPRESSION: 1. No emergent large vessel occlusion. 2. Unchanged right convexity subdural hematoma and anterior right temporal subarachnoid hematoma. 3. Attenuated vascularity within the distal right MCA distribution, in the area of ischemic change. No proximal occlusion. 4.  Aortic Atherosclerosis (ICD10-I70.0).   Ct Head Wo Contrast 12/26/2016 IMPRESSION: 1. Acute right-sided subdural hematoma overlying the convexity of the right temporal lobe with small focus of subarachnoid hemorrhage in the adjacent right parietal lobe. The hematoma measures up to 10 mm in thickness. No significant midline shift. Critical Value/emergent results were called by telephone at the time of interpretation on  12/26/2016 at 6:50 pm to Dr. Raeford RazorSTEPHEN KOHUT , who verbally acknowledged these results. 2. Atrophy with chronic moderate small vessel ischemic disease. 3. Right periorbital and forehead soft tissue contusion and swelling. No acute maxillofacial fracture. 4. Cervical spondylosis without acute posttraumatic cervical spine fracture or subluxation.   Mr Brain Wo Contrast 12/28/2016 IMPRESSION: 1. Confluent acute infarct involving a 5 cm area of the posterior right MCA territory with petechial hemorrhage but only mild regional mass effect. 2. Superimposed right hemisphere combination of epidural (stable, adjacent to the right temporal lobe with associated subtle skull fracture) and trace subdural hematoma. These are thought to be posttraumatic and unrelated to #1. 3.  The examination had to be discontinued prior to completion.    Ct Head Code Stroke Wo Contrast 12/28/2016   CONCLUSION: Subtle nondepressed right temporal bone fracture such that the stable hyperdense right extra-axial hemorrhage is most compatible with EPIDURAL hematoma. Electronically Signed   By: [name]   On: 12/28/2016 11:07  IMPRESSION: 1. Evolving acute to subacute posterior right MCA infarct involving the posterior right insula, right temporal and parietal lobes. 2. Stable right subdural hematoma since yesterday. Trace associated right temporal lobe subarachnoid hemorrhage. No malignant hemorrhagic transformation of #1 at this time. 3. No skull fracture identified. Mild mass effect in the right hemisphere with no midline shift. 4. ASPECTS is not applicable, acute hemorrhage.  2D Echocardiogram   - Left ventricle: The cavity size was normal. Wall thickness was   increased in a pattern of mild LVH. Systolic function was normal.   The estimated ejection fraction was in the range of 50% to 55%. - Mitral valve: Calcified annulus. There was mild regurgitation. - Left atrium: The atrium was mildly to moderately dilated. - Right atrium: The atrium was mildly dilated. - Pulmonary arteries: Systolic pressure was mildly increased. PA   peak pressure: 34 mm Hg (S).   PHYSICAL EXAM  Temp:  [98 F (36.7 C)-99.7 F (37.6 C)] 99.5 F (37.5 C) (11/08 1620) Pulse Rate:  [60-95] 95 (11/08 0327) Resp:  [19-35] 23 (11/08 0327) BP: (91-130)/(63-76) 130/65 (11/08 1150) SpO2:  [96 %-100 %] 98 % (11/08 1307)  General - Well nourished, well developed, in mild pain at shoulders.  Ophthalmologic - fundi not visualized due to noncooperation.  Cardiovascular - irregularly irregular heart rate and rhythm.  Mental Status -  Level of arousal and orientation to time, place, and person were intact. Language including expression, naming, repetition, comprehension was assessed and found intact. Fund of Knowledge was assessed and was intact.  Cranial Nerves II -  XII - II - left hemianopia vs. Left visual neglect. III, IV, VI - right gaze preference, not cross midline. V - Facial sensation intact bilaterally. VII - left facial droop. VIII - Hearing & vestibular intact bilaterally. X - Palate elevates symmetrically. XI - Chin turning intact & shoulder shrug difficulty bilaterally due to pain. XII - Tongue protrusion intact.  Motor Strength - The patient's strength was normal in RUE and RLE, but LUE 0/5 and LLE mild withdraw on pain.  Bulk was normal and fasciculations were absent.   Motor Tone - Muscle tone was assessed at the neck and appendages and was normal.  Reflexes - The patient's reflexes were 1+ in all extremities and she had no pathological reflexes.  Sensory - Light touch, temperature/pinprick were assessed and were symmetrical.    Coordination - The patient had normal movements in the right hand with no ataxia or dysmetria.  Tremor was absent.  Gait and Station - not tested.   ASSESSMENT/PLAN Ms. Alison Doyle is a 81 y.o. female with history of DM admitted for left weakness, right gaze. No tPA given due to epidural hemorrhage and out of window.    Stroke:  right MCA large infarct embolic secondary to newly diagnosed afib not on AC  Resultant right gaze, left hemiplegia, left neglect  MRI  Right MCA infarct with petechial hemorrahge  CTA head and neck - no LVO  2D Echo  EF 50-55%  LDL 69  HgbA1c 8.7  Heparin subq for VTE prophylaxis  DIET - DYS 1 Room service appropriate? Yes; Fluid consistency: Honey Thick   aspirin 81 mg daily prior to admission, now on No antithrombotic due to epidural hemorrhage  Patient counseled to be compliant with her antithrombotic medications  Ongoing aggressive stroke risk factor management  Therapy recommendations:  pending  Disposition:  Pending  Right temporal epidural hematoma s/p fall  Traumatic  Stable in size with serial CT and MRI  NSG on board  Conservative  treatment  Diabetes  HgbA1c 8.7 goal < 7.0  Uncontrolled  Currently on lantus  CBG monitoring  SSI  DM education and close PCP follow up  Hypertension Stable  BP goal < 160  Hyperlipidemia  Home meds:  Pravastatin 80   LDL 69, goal < 70  Now on home pravastatin  Continue statin at discharge  Other Stroke Risk Factors  Advanced age  Obesity, Body mass index is 32.18 kg/m.   Other Active Problems  UA WBC 6-30  Blood cx 2/2 staph coagulase neg  Hyponatremia  Leukocytosis 13.6->10.2  Shoulder pain - X-ray pending to rule out fracture  Hospital day # 2  I spent  35 minutes in total face-to-face time with the patient, more than 50% of which was spent in counseling and coordination of care, reviewing test results, images and medication, and discussing the diagnosis of stroke, epidural hematoma, uncontrolled DM, treatment plan and potential prognosis. This patient's care requiresreview of multiple databases, neurological assessment, discussion with family, other specialists and medical decision making of high complexity.   Marvel PlanJindong Doug Bucklin, MD PhD Stroke Neurology 12/28/2016 4:33 PM    To contact Stroke Continuity provider, please refer to [email or web address]. After hours, contact General Neurology

## 2016-12-28 NOTE — Progress Notes (Signed)
PROGRESS NOTE    Alison Doyle  ZOX:[id number] DOB: June 19, 1935 DOA: 12/26/2016 PCP: [name], MD   Specialists:  none   Brief Narrative:   81 year old female Diabetes mellitus type 2 Osteoarthritis Hyperlipidemia Prior cholecystitis status post ERCP 2007  Right total knee replacement  Recently treated in emergency room 08/31/2016 for urinary tract infection with Keflex  Recently treated in emergency room 08/31/2016 for urinary tract infection with Keflex  Admitted to hospital 12/26/2016 with altered mental status probable fall found last normal 5 PM of 11/6 face down, lactic acid was >4 and she was febrile --noted to have some slurred speech and right-sided facial droop and some posturing of the right arm UTI as well as subdural hematoma was found and she was started on antibiotics for altered mental status presumably because of both  Developed new onset atrial fibrillation with RVR over the course of the night on admission    Assessment & Plan:   Active Problems:   Sepsis (HCC)   Subdural hematoma as well as fall L MCA cva noted on Ct head 11/7  Neurosurgery input appreciated-holding for now aspirin 81 as per neurologist  Dys 1 diet/honey liquids + thickener as per SLP-mbs 11/7  Further work-up carotids Echo pending  SLP has seen, cleared for dys 1, honey thickdiet-ordered Cognitive eval as well  xrays of shoulder/humerus are neg from 11/8   Atrial fibrillation with RVR, new onset as of 12/26/2016 Chads score >5  Echo ordered but pending  On Cardizem Gtt--will convert to PO XL Cardizem 120 and observe  anticoagulation eventually going forward-defer discussion to NS/Neuro given Subdural hematoma  Mild hypokalemia  K 3.0  Check magnesium as well given afib and replace  Diarrhea Await further diarrhea stool--if none, then no need to test for Cdiff  Possible sepsis with lactic acidosis of 4 White count on admission 13.8 Risk of aspiration given confirmed  stroke  Get 1 vw CXR 11/8  IV vancomycin and Zosyn on admission--BC growing 2/2 of coag neg staphy  Continue IV saline 100 cc/h for now--WBc improved 13--->10  May require TEE--would reculture and re-assess  Diabetes mellitus type 2, A1c this admit 8.7  On metformin at home-? cause lactic acidosis, holding liraglutide  Will need insulin discussion and teaching this admit  Sugars 201-244  Adding lantus 7 q am    Current daily smoker  With assess when more awake alert  Chronic low back pain and osteoarthritis  holding tramadol 50-100 every 4 as needed  Hyperlipidemia  Change pravachol 20--->80 MG high intensity dosing    Place on SCD-hold Lovenoex until seen Inpatient on stepdown   Consultants:   None  Procedures:   None  Antimicrobials:   Vancomycin/Zosyn 11/7   Subjective:  Some confusion " I wanna go to bed" & "i wanna go to my room" Can orient to place [Hopsital] Cannot tell much else deficits seem less on L side today  Objective: Vitals:   12/27/16 0609 12/27/16 0617 12/27/16 0657 12/27/16 0721  BP:    121/69  Pulse: (!) 125 (!) 134 98 (!) 106  Resp: (!) 23 (!) 26 (!) 24 (!) 25  Temp:    99 F (37.2 C)  TempSrc:    Oral  SpO2: 99% 98% 99% 98%  Weight:      Height:        Intake/Output Summary (Last 24 hours) at 12/27/2016 0731 Last data filed at 12/27/2016 0543 Gross per 24 hour  Intake 3521 ml  Output 1900 ml  Net 1621 ml   Filed Weights   12/26/16 1651 12/26/16 2127  Weight: 90.7 kg (200 lb) 96 kg (211 lb 10.3 oz)    Examination:  Alert pleasant in nad Some mild neglect o L side s1 s2 irreg irreg-Afib on monitor abd soft nt nd no rebound no guard No le edema cta b Still dense deficit on the L side but less pronounced today with some improvement-lifitng arm a little can bend knee more   Data Reviewed: I have personally reviewed following labs and imaging studies  CBC: Recent Labs  Lab 12/26/16 1653 12/27/16 0232  WBC 13.8*  13.6*  NEUTROABS 11.7*  --   HGB 15.6* 14.7  HCT 47.8* 45.5  MCV 85.8 86.2  PLT 221 189   Basic Metabolic Panel: Recent Labs  Lab 12/26/16 1653 12/27/16 0232  NA 137 137  K 4.2 3.0*  CL 105 105  CO2 19* 22  GLUCOSE 329* 274*  BUN 12 10  CREATININE 0.94 0.83  CALCIUM 8.6* 8.1*   GFR: Estimated Creatinine Clearance: 67.6 mL/min (by C-G formula based on SCr of 0.83 mg/dL). Liver Function Tests: Recent Labs  Lab 12/26/16 1653 12/27/16 0232  AST 39 43*  ALT 15 17  ALKPHOS 97 81  BILITOT 0.8 0.9  PROT 6.7 5.9*  ALBUMIN 3.3* 2.9*   No results for input(s): LIPASE, AMYLASE in the last 168 hours. No results for input(s): AMMONIA in the last 168 hours. Coagulation Profile: Recent Labs  Lab 12/26/16 1653  INR 1.13   Cardiac Enzymes: Recent Labs  Lab 12/26/16 2156  CKTOTAL 2,293*   BNP (last 3 results) No results for input(s): PROBNP in the last 8760 hours. HbA1C: No results for input(s): HGBA1C in the last 72 hours. CBG: Recent Labs  Lab 12/26/16 1645 12/26/16 2128  GLUCAP 339* 343*   Lipid Profile: No results for input(s): CHOL, HDL, LDLCALC, TRIG, CHOLHDL, LDLDIRECT in the last 72 hours. Thyroid Function Tests: No results for input(s): TSH, T4TOTAL, FREET4, T3FREE, THYROIDAB in the last 72 hours. Anemia Panel: No results for input(s): VITAMINB12, FOLATE, FERRITIN, TIBC, IRON, RETICCTPCT in the last 72 hours. Urine analysis:    Component Value Date/Time   COLORURINE AMBER (A) 12/26/2016 1653   APPEARANCEUR CLOUDY (A) 12/26/2016 1653   LABSPEC 1.027 12/26/2016 1653   PHURINE 5.0 12/26/2016 1653   GLUCOSEU >=500 (A) 12/26/2016 1653   HGBUR LARGE (A) 12/26/2016 1653   BILIRUBINUR NEGATIVE 12/26/2016 1653   KETONESUR 20 (A) 12/26/2016 1653   PROTEINUR >=300 (A) 12/26/2016 1653   UROBILINOGEN 0.2 11/29/2014 2049   NITRITE POSITIVE (A) 12/26/2016 1653   LEUKOCYTESUR NEGATIVE 12/26/2016 1653     Radiology Studies: Reviewed images personally in  health database    Scheduled Meds: . albuterol  2.5 mg Nebulization Q6H  . diltiazem  30 mg Oral Q8H  . insulin aspart  0-15 Units Subcutaneous TID WC  . insulin aspart  0-5 Units Subcutaneous QHS  . insulin aspart  0-9 Units Subcutaneous TID WC  . ipratropium  0.5 mg Nebulization Q6H  . mouth rinse  15 mL Mouth Rinse BID  . metoprolol tartrate  5 mg Intravenous Once   Continuous Infusions: . piperacillin-tazobactam (ZOSYN)  IV 3.375 g (12/27/16 0543)  . vancomycin Stopped (12/27/16 0657)     LOS: 1 day    Time spent: 3935    Pleas KochJai Segundo Makela, MD [facility](P) [phone]   If 7PM-7AM, please contact night-coverage [email or web address] Password [email or web address] 12/27/2016,  7:31 AM

## 2016-12-28 NOTE — Progress Notes (Signed)
OT Cancellation Note  Patient Details Name: Karma GanjaJudy C Overall MRN: [id number] DOB: [birth year]   Cancelled Treatment:    Reason Eval/Treat Not Completed: Patient at procedure or test/ unavailable. Upon arrival to floor ask RN how pt was doing prior to eval, however transportation arriving to take pt to CT.  Margaretmary EddySpencer, Jasai Sorg [facility] LLPJeanette 12/28/2016, 2:13 PM

## 2016-12-29 ENCOUNTER — Other Ambulatory Visit (HOSPITAL_COMMUNITY): Payer: Self-pay | Admitting: *Deleted

## 2016-12-29 ENCOUNTER — Inpatient Hospital Stay (HOSPITAL_COMMUNITY): Payer: Medicare Other

## 2016-12-29 DIAGNOSIS — I63411 Cerebral infarction due to embolism of right middle cerebral artery: Secondary | ICD-10-CM

## 2016-12-29 LAB — CULTURE, BLOOD (ROUTINE X 2)
Special Requests: ADEQUATE
Special Requests: ADEQUATE

## 2016-12-29 LAB — CBC
HEMATOCRIT: 45.2 % (ref 36.0–46.0)
Hemoglobin: 14.2 g/dL (ref 12.0–15.0)
MCH: 27.6 pg (ref 26.0–34.0)
MCHC: 31.4 g/dL (ref 30.0–36.0)
MCV: 87.9 fL (ref 78.0–100.0)
Platelets: 153 10*3/uL (ref 150–400)
RBC: 5.14 MIL/uL — ABNORMAL HIGH (ref 3.87–5.11)
RDW: 14.3 % (ref 11.5–15.5)
WBC: 7 10*3/uL (ref 4.0–10.5)

## 2016-12-29 LAB — VAS US CAROTID
LCCAPSYS: 66 cm/s
LEFT ECA DIAS: -14 cm/s
LEFT VERTEBRAL DIAS: -11 cm/s
LICADSYS: -78 cm/s
Left CCA dist dias: -11 cm/s
Left CCA dist sys: -38 cm/s
Left CCA prox dias: 16 cm/s
Left ICA dist dias: -30 cm/s
Left ICA prox dias: -11 cm/s
Left ICA prox sys: -37 cm/s
RCCADSYS: -56 cm/s
RCCAPDIAS: 18 cm/s
RIGHT ECA DIAS: -13 cm/s
RIGHT VERTEBRAL DIAS: -15 cm/s
Right CCA prox sys: 68 cm/s

## 2016-12-29 LAB — GLUCOSE, CAPILLARY
GLUCOSE-CAPILLARY: 175 mg/dL — AB (ref 65–99)
GLUCOSE-CAPILLARY: 240 mg/dL — AB (ref 65–99)
GLUCOSE-CAPILLARY: 129 mg/dL — AB (ref 65–99)
GLUCOSE-CAPILLARY: 125 mg/dL — AB (ref 65–99)

## 2016-12-29 LAB — BASIC METABOLIC PANEL
Anion gap: 7 (ref 5–15)
BUN: 12 mg/dL (ref 6–20)
CO2: 23 mmol/L (ref 22–32)
Calcium: 8.1 mg/dL — ABNORMAL LOW (ref 8.9–10.3)
Chloride: 107 mmol/L (ref 101–111)
Creatinine, Ser: 0.63 mg/dL (ref 0.44–1.00)
GFR calc Af Amer: >60 mL/min (ref >60)
GLUCOSE: 212 mg/dL — AB (ref 65–99)
POTASSIUM: 3.2 mmol/L — AB (ref 3.5–5.1)
Sodium: 137 mmol/L (ref 135–145)

## 2016-12-29 MED ORDER — MAGNESIUM OXIDE 400 (241.3 MG) MG PO TABS
400.0000 mg | ORAL_TABLET | Freq: Two times a day (BID) | ORAL | Status: DC
  Administered 2016-12-29 – 2017-01-04 (×12): 400 mg via ORAL
  Filled 2016-12-29 (×12): qty 1

## 2016-12-29 MED ORDER — PNEUMOCOCCAL VAC POLYVALENT 25 MCG/0.5ML IJ INJ
0.5000 mL | INJECTION | INTRAMUSCULAR | Status: AC
  Administered 2017-01-01: 0.5 mL via INTRAMUSCULAR
  Filled 2016-12-29: qty 0.5

## 2016-12-29 MED ORDER — NICOTINE 21 MG/24HR TD PT24
21.0000 mg | MEDICATED_PATCH | Freq: Every day | TRANSDERMAL | Status: DC
  Administered 2016-12-29 – 2017-01-04 (×7): 21 mg via TRANSDERMAL
  Filled 2016-12-29 (×7): qty 1

## 2016-12-29 MED ORDER — IPRATROPIUM-ALBUTEROL 0.5-2.5 (3) MG/3ML IN SOLN: 3.0000 mL | Freq: Four times a day (QID) | RESPIRATORY_TRACT | Status: DC | PRN

## 2016-12-29 NOTE — Plan of Care (Signed)
  No Outcome Acute Rehab PT Goals(only PT should resolve) Pt will Roll Supine to Side 12/29/2016 1812 by Murrell Dome, Eliseo GumKenneth V, PT Flowsheets Taken 12/29/2016 1812  Pt will Roll Supine to Side with mod assist Pt Will Go Supine/Side To Sit 12/29/2016 1812 by Khamya Topp, Eliseo GumKenneth V, PT Flowsheets Taken 12/29/2016 1812  Pt will go Supine/Side to Sit with moderate assist Pt Will Go Sit To Supine/Side 12/29/2016 1812 by Charene Mccallister, Eliseo GumKenneth V, PT Flowsheets Taken 12/29/2016 1812  Pt will go Sit to Supine/Side with moderate assist Patient Will Perform Sitting Balance 12/29/2016 1812 by Kate SableMottinger, Gracey Tolle V, PT Flowsheets Taken 12/29/2016 1812  Patient will perform sitting balance with supervision;3- 5 min;with no UE support Pt Will Transfer Bed To Chair/Chair To Bed 12/29/2016 1812 by Jarad Barth, Eliseo GumKenneth V, PT Flowsheets Taken 12/29/2016 1812  Pt will Transfer Bed to Chair/Chair to Bed with mod assist Pt Will Perform Standing Balance Or Pre-Gait 12/29/2016 1812 by Kate SableMottinger, Bruno Leach V, PT Flowsheets Taken 12/29/2016 1812  Pt will perform standing balance or pre-gait  with moderate assist;with +2;with bilateral UE support;1-2 min

## 2016-12-29 NOTE — Progress Notes (Signed)
STROKE TEAM PROGRESS NOTE   SUBJECTIVE (INTERVAL HISTORY) Son is at the bedside.  Overall her condition is stable. She has right facial ecchymoses. Continues to complain of pain in shoulders with any movement. Moving Left side better today. Left neglect unchanged. Son voices no new concerns. No new events reported overnight by nursing  OBJECTIVE Temp:  [98.1 F (36.7 C)-99.5 F (37.5 C)] 98.2 F (36.8 C) (11/09 0959) Pulse Rate:  [72-113] 72 (11/09 0959) Cardiac Rhythm: Atrial fibrillation (11/09 0900) Resp:  [18-27] 18 (11/09 0959) BP: (117-128)/(70-87) 118/83 (11/09 0959) SpO2:  [98 %-99 %] 98 % (11/09 0959) Weight:  [96.2 kg (212 lb 1.3 oz)] 96.2 kg (212 lb 1.3 oz) (11/08 2102)  Recent Labs  Lab 12/28/16 1146 12/28/16 1629 12/28/16 2041 12/29/16 0730 12/29/16 1151  GLUCAP 314* 208* 172* 175* 240*   Recent Labs  Lab 12/26/16 1653 12/27/16 0232 12/28/16 0233 12/28/16 0600 12/29/16 0854  NA 137 137 137  --  137  K 4.2 3.0* 3.0*  --  3.2*  CL 105 105 106  --  107  CO2 19* 22 24  --  23  GLUCOSE 329* 274* 219*  --  212*  BUN 12 10 14   --  12  CREATININE 0.94 0.83 0.74  --  0.63  CALCIUM 8.6* 8.1* 8.3*  --  8.1*  MG  --   --   --  1.7  --   PHOS  --   --  3.2  --   --    Recent Labs  Lab 12/26/16 1653 12/27/16 0232 12/28/16 0233  AST 39 43*  --   ALT 15 17  --   ALKPHOS 97 81  --   BILITOT 0.8 0.9  --   PROT 6.7 5.9*  --   ALBUMIN 3.3* 2.9* 2.7*   Recent Labs  Lab 12/26/16 1653 12/27/16 0232 12/28/16 0233 12/29/16 0854  WBC 13.8* 13.6* 10.2 7.0  NEUTROABS 11.7*  --   --   --   HGB 15.6* 14.7 14.0 14.2  HCT 47.8* 45.5 43.5 45.2  MCV 85.8 86.2 88.2 87.9  PLT 221 189 155 153   Recent Labs  Lab 12/26/16 2156  CKTOTAL 2,293*   Recent Labs    12/26/16 1653  LABPROT 14.4  INR 1.13   Recent Labs    12/26/16 1653  COLORURINE AMBER*  LABSPEC 1.027  PHURINE 5.0  GLUCOSEU >=500*  HGBUR LARGE*  BILIRUBINUR NEGATIVE  KETONESUR 20*  PROTEINUR  >=300*  NITRITE POSITIVE*  LEUKOCYTESUR NEGATIVE       Component Value Date/Time   CHOL 141 12/28/2016 0642   TRIG 202 (H) 12/28/2016 0642   HDL 32 (L) 12/28/2016 0642   CHOLHDL 4.4 12/28/2016 0642   VLDL 40 12/28/2016 0642   LDLCALC 69 12/28/2016 0642   Lab Results  Component Value Date   HGBA1C 8.7 (H) 12/28/2016   No results found for: LABOPIA, COCAINSCRNUR, LABBENZ, AMPHETMU, THCU, LABBARB  No results for input(s): ETH in the last 168 hours.  I have personally reviewed the radiological images below and agree with the radiology interpretations.  Ct Angio Head and neck W Or Wo Contrast 12/27/2016 IMPRESSION: 1. No emergent large vessel occlusion. 2. Unchanged right convexity subdural hematoma and anterior right temporal subarachnoid hematoma. 3. Attenuated vascularity within the distal right MCA distribution, in the area of ischemic change. No proximal occlusion. 4.  Aortic Atherosclerosis (ICD10-I70.0).   Ct Head Wo Contrast 12/26/2016 IMPRESSION: 1. Acute right-sided subdural hematoma  overlying the convexity of the right temporal lobe with small focus of subarachnoid hemorrhage in the adjacent right parietal lobe. The hematoma measures up to 10 mm in thickness. No significant midline shift. Critical Value/emergent results were called by telephone at the time of interpretation on 12/26/2016 at 6:50 pm to Dr. Raeford RazorSTEPHEN KOHUT , who verbally acknowledged these results. 2. Atrophy with chronic moderate small vessel ischemic disease. 3. Right periorbital and forehead soft tissue contusion and swelling. No acute maxillofacial fracture. 4. Cervical spondylosis without acute posttraumatic cervical spine fracture or subluxation.   Mr Brain Wo Contrast 12/28/2016 IMPRESSION: 1. Confluent acute infarct involving a 5 cm area of the posterior right MCA territory with petechial hemorrhage but only mild regional mass effect. 2. Superimposed right hemisphere combination of epidural (stable, adjacent to  the right temporal lobe with associated subtle skull fracture) and trace subdural hematoma. These are thought to be posttraumatic and unrelated to #1. 3.  The examination had to be discontinued prior to completion.   Ct Head Code Stroke Wo Contrast 12/28/2016   CONCLUSION: Subtle nondepressed right temporal bone fracture such that the stable hyperdense right extra-axial hemorrhage is most compatible with EPIDURAL hematoma. Electronically Signed   By: [name]   On: 12/28/2016 11:07  IMPRESSION: 1. Evolving acute to subacute posterior right MCA infarct involving the posterior right insula, right temporal and parietal lobes. 2. Stable right subdural hematoma since yesterday. Trace associated right temporal lobe subarachnoid hemorrhage. No malignant hemorrhagic transformation of #1 at this time. 3. No skull fracture identified. Mild mass effect in the right hemisphere with no midline shift. 4. ASPECTS is not applicable, acute hemorrhage.  2D Echocardiogram   - Left ventricle: The cavity size was normal. Wall thickness was   increased in a pattern of mild LVH. Systolic function was normal.   The estimated ejection fraction was in the range of 50% to 55%. - Mitral valve: Calcified annulus. There was mild regurgitation. - Left atrium: The atrium was mildly to moderately dilated. - Right atrium: The atrium was mildly dilated. - Pulmonary arteries: Systolic pressure was mildly increased. PA   peak pressure: 34 mm Hg (S).  X-Ray Left Shoulder 12/28/16 IMPRESSION: 1. Chronic deformity of the left humeral head 2. Narrowed subacromial space suggests rotator cuff disease  B/L Carotid U/S - No evidence of a significant stenosis in bilateral carotid arteries   PHYSICAL EXAM  Temp:  [98.1 F (36.7 C)-99.5 F (37.5 C)] 98.2 F (36.8 C) (11/09 0959) Pulse Rate:  [72-113] 72 (11/09 0959) Resp:  [18-27] 18 (11/09 0959) BP: (117-128)/(70-87) 118/83 (11/09 0959) SpO2:  [98 %-99 %] 98 % (11/09  0959) Weight:  [96.2 kg (212 lb 1.3 oz)] 96.2 kg (212 lb 1.3 oz) (11/08 2102)  General - Well nourished, well developed, not in acute distress. Respiratory - Lungs clear bilaterally, No wheezing Cardiovascular - irregularly irregular heart rate and rhythm.  Mental Status -  Level of arousal and orientation to time, place, and person were intact. Language including expression, naming, repetition, comprehension was assessed and found intact. Fund of Knowledge was assessed and was intact.  Cranial Nerves II - XII - II - left hemianopia. III, IV, VI - right gaze preference, but able to cross midline. V - Facial sensation intact bilaterally. VII - left facial droop. VIII - Hearing & vestibular intact bilaterally. X - Palate elevates symmetrically. XI - Chin turning intact & shoulder shrug difficulty bilaterally due to pain. XII - Tongue protrusion intact.  Motor  Strength - The patient's strength was normal in RUE and RLE, LUE 0/5 and LLE 1/5.  Bulk was normal and fasciculations were absent.   Motor Tone - Muscle tone was assessed at the neck and appendages and was normal.  Reflexes - The patient's reflexes were 1+ in all extremities and she had no pathological reflexes.  Sensory - Light touch, temperature/pinprick were assessed and were decrease on the LUE, however, pt lack of effort on exam.    Coordination - The patient had normal movements in the right hand with no ataxia or dysmetria.  Tremor was absent.  Gait and Station - not tested.   ASSESSMENT/PLAN Ms. Alison Doyle is a 81 y.o. female with history of DM admitted for left weakness, right gaze. No tPA given due to epidural hemorrhage and out of window.    12/29/16: Neuro exam remains stable. Continues to c/o pain in shoulders with movement. X-Ray negative. Son at bedside and POC reviewed for Repeat Head CT and time frame for starting Eliquis.   Stroke:  right MCA large infarct embolic secondary to newly diagnosed afib not on  AC  Resultant left hemiplegia, left neglect, left hemianopia  MRI  Right MCA infarct with petechial hemorrahge  CTA head and neck - no LVO  2D Echo  EF 50-55%  LDL 69  HgbA1c 8.7  Heparin subq for VTE prophylaxis  DIET - DYS 1 Room service appropriate? Yes; Fluid consistency: Honey Thick   aspirin 81 mg daily prior to admission, now on No antithrombotic due to epidural hemorrhage  Patient counseled to be compliant with her antithrombotic medications  Ongoing aggressive stroke risk factor management  Therapy recommendations:  CIR  Disposition:  Pending  Afib  New diagnosis  Rate controlled  Consider to start Eliquis in 2 weeks if Repeat Head CT shows bleeding resolved or near resolution at that time.  consider to start ASA 81 mg daily in one week until Eliquis started, then discontinue ASA.  Right temporal epidural hematoma s/p fall  Traumatic   Stable in size with serial CT and MRI  NSG on board  Conservative treatment   Diabetes  HgbA1c 8.7 goal < 7.0  Uncontrolled  Currently on lantus  CBG monitoring  SSI  DM education and close PCP follow up  Hypertension Stable  BP goal < 160  Hyperlipidemia  Home meds:  Pravastatin 80   LDL 69, goal < 70  Now on home pravastatin  Continue statin at discharge  Other Stroke Risk Factors  Advanced age  Obesity, Body mass index is 32.25 kg/m.   Other Active Problems  UA WBC 6-30  Blood cx 2/2 staph coagulase neg  Hyponatremia  Leukocytosis 13.6->10.2->7.0  Hospital day # 3  Brita RompMary A Costello, ANP-C Stroke Neurology Team 12/29/2016 12:53 PM   I reviewed above note and agree with the assessment and plan. I have made any additions or clarifications directly to the above note. Pt was seen and examined, not in acute distress. Son at bedside. I had long discussion with son, updated pt current condition, treatment plan and potential prognosis. He expressed understanding and appreciation. Pt  still has left hemiplegia, left hemianopia. Pending CIR. Consider to restart ASA one week post stroke and consider initiate eliquis 2 weeks after stroke with repeat CT head of right epidural hematoma resolved or near resolved.   Neurology will sign off. Please call with questions. [facility]. Thanks for  the consult.   Marvel Plan, MD PhD Stroke Neurology 12/29/2016 9:45 PM     To contact Stroke Continuity provider, please refer to [email or web address]. After hours, contact General Neurology

## 2016-12-29 NOTE — Evaluation (Signed)
Occupational Therapy Evaluation Patient Details Name: Alison Doyle MRN: [id number] DOB: 1935/12/08 Today's Date: 12/29/2016    History of Present Illness 80yo [facility] for sepsis, UTI, SDH and DM.   Clinical Impression   Pt with decline in function and safety with ADLs and ADL mobility with decreased strength, balance, endurance and cognition. Pt requires extensive assist with ADLs and multimodal cues for initiation and redirection. Pt would benefit from acute OT services to address impairments to maximize level of function and safety    Follow Up Recommendations  SNF;Supervision/Assistance - 24 hour    Equipment Recommendations  None recommended by OT;Other (comment)(TBD at next venue of care)    Recommendations for Other Services       Precautions / Restrictions Precautions Precautions: Fall Restrictions Weight Bearing Restrictions: No      Mobility Bed Mobility Overal bed mobility: Needs Assistance Bed Mobility: Supine to Sit;Sit to Supine     Supine to sit: Max assist;+2 for physical assistance Sit to supine: Max assist;+2 for physical assistance   General bed mobility comments: cues for initiateion, assist [facility] to [facility] and trunk eleavtiono  Transfers                 General transfer comment: unable    Balance Overall balance assessment: Needs assistance   Sitting balance-Leahy Scale: Fair                                     ADL either performed or assessed with clinical judgement   ADL Overall ADL's : Needs assistance/impaired     Grooming: Wash/dry hands;Wash/dry face;Minimal assistance;Bed level Grooming Details (indicate cue type and reason): max cues to initiate Upper Body Bathing: Total assistance   Lower Body Bathing: Total assistance   Upper Body Dressing : Total assistance   Lower Body Dressing: Total assistance   Toilet Transfer: Total assistance   Toileting- Clothing Manipulation and  Hygiene: Total assistance         General ADL Comments:  pt fixated on food and drink from [employer], talkative about various subjects that were not relevant to OT eval/questions, difficult to redirect pt     Vision Baseline Vision/History: Wears glasses Wears Glasses: Reading only(per pt report) Patient Visual Report: No change from baseline       Perception     Praxis      Pertinent Vitals/Pain Pain Assessment: No/denies pain Faces Pain Scale: Hurts little more Pain Location: right shoulder/arm Pain Intervention(s): Limited activity within patient's tolerance;Monitored during session;Repositioned     Hand Dominance Right   Extremity/Trunk Assessment Upper Extremity Assessment Upper Extremity Assessment: Generalized weakness   Lower Extremity Assessment Lower Extremity Assessment: Defer to PT evaluation       Communication     Cognition Arousal/Alertness: Awake/alert Behavior During Therapy: Anxious Overall Cognitive Status: Impaired/Different from baseline Area of Impairment: Orientation;Attention;Memory;Following commands;Problem solving;Awareness;Safety/judgement                 Orientation Level: Disoriented to;Time;Situation           Problem Solving: Requires verbal cues;Decreased initiation General Comments: unable to obtain accurate  info from pt, pt fixated on food and drink from [employer]   General Comments    Pt pleasantly confused   Exercises     Shoulder Instructions      Home Living   Living Arrangements: Alone  Additional Comments: unable to obtain accurate PLOF info from pt, pt fixated on food and drink from [employer]      Prior Functioning/Environment          Comments: unable to obtain accurate PLOF info from pt, pt fixated on food and drink from [employer]        OT Problem List: Decreased strength;Decreased activity tolerance;Decreased cognition;Decreased knowledge of use of DME or  AE;Impaired balance (sitting and/or standing);Decreased coordination;Decreased safety awareness;Decreased knowledge of precautions      OT Treatment/Interventions: Self-care/ADL training;DME and/or AE instruction;Therapeutic activities;Therapeutic exercise;Patient/family education    OT Goals(Current goals can be found in the care plan section) Acute Rehab OT Goals Patient Stated Goal: none stated OT Goal Formulation: Patient unable to participate in goal setting Time For Goal Achievement: 01/05/17 Potential to Achieve Goals: Good ADL Goals Pt Will Perform Grooming: with min guard assist;with supervision;with set-up;sitting Pt Will Perform Upper Body Bathing: with max assist;with mod assist Pt Will Perform Lower Body Bathing: with max assist;with mod assist;sitting/lateral leans Pt Will Perform Upper Body Dressing: with max assist;with mod assist;sitting Pt Will Transfer to Toilet: with max assist;with mod assist;bedside commode  OT Frequency: Min 2X/week   Barriers to D/C: Decreased caregiver support          Co-evaluation              AM-PAC PT "6 Clicks" Daily Activity     Outcome Measure Help from another person eating meals?: A Little Help from another person taking care of personal grooming?: A Lot Help from another person toileting, which includes using toliet, bedpan, or urinal?: Total Help from another person bathing (including washing, rinsing, drying)?: Total Help from another person to put on and taking off regular upper body clothing?: Total Help from another person to put on and taking off regular lower body clothing?: Total 6 Click Score: 9   End of Session    Activity Tolerance: Patient limited by pain;Other (comment)(limited by cognition) Patient left: in bed;with bed alarm set  OT Visit Diagnosis: Muscle weakness (generalized) (M62.81);Other symptoms and signs involving cognitive function;History of falling (Z91.81);Pain                Time:  [id number] OT Time Calculation (min): 24 min Charges:  OT General Charges $OT Visit: 1 Visit OT Evaluation $OT Eval Moderate Complexity: 1 Mod OT Treatments $Self Care/Home Management : 113-127 mins G-Codes: OT G-codes **NOT FOR INPATIENT CLASS** Functional Assessment Tool Used: AM-PAC 6 Clicks Daily Activity     Alison Doyle, Alison Doyle 12/29/2016, 2:47 PM

## 2016-12-29 NOTE — Progress Notes (Signed)
Preliminary results by tech - Carotid duplex completed. No evidence of a significant stenosis in bilateral carotid arteries. Marilynne Halstedita  Grosser, BS, RDMS, RVT

## 2016-12-29 NOTE — Progress Notes (Signed)
Patient requesting a nicotine patch. MD notified. MD gave verbal order for a nicotine patch daily. Orders placed. Will continue to monitor.

## 2016-12-29 NOTE — Evaluation (Signed)
Physical Therapy Evaluation Patient Details Name: Alison GanjaJudy C Doyle MRN: [id number] DOB: Nov 11, 1935 Today's Date: 12/29/2016   History of Present Illness  81[facility] for sepsis, UTI, SDH and DM.  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  Clinical Impression  Pt admitted with/for sepsis, UTI, SDH, MRI showing acute infarct R Post MCA.  Pt needing significant assist for basic mobility and will be safest with 2 person assist at this time..  Pt currently limited functionally due to the problems listed. ( See problems list.)   Pt will benefit from PT to maximize function and safety in order to get ready for next venue listed below.     Follow Up Recommendations CIR    Equipment Recommendations  Other (comment)(TBA)    Recommendations for Other Services Rehab consult     Precautions / Restrictions Precautions Precautions: Fall Restrictions Weight Bearing Restrictions: No      Mobility  Bed Mobility Overal bed mobility: Needs Assistance Bed Mobility: Supine to Sit;Sit to Supine     Supine to sit: Max assist Sit to supine: Max assist   General bed mobility comments: cues for initiation, significant assist to help move through normalized movemet  Transfers Overall transfer level: Needs assistance               General transfer comment: deferred by pt  Ambulation/Gait             General Gait Details: NT  Stairs            Wheelchair Mobility    Modified Rankin (Stroke Patients Only)       Balance Overall balance assessment: Needs assistance Sitting-balance support: Feet supported;Single extremity supported;No upper extremity supported Sitting balance-Leahy Scale: Fair Sitting balance - Comments: but with tendency to list posteriorly                                     Pertinent Vitals/Pain Pain Assessment: Faces Faces Pain Scale: Hurts even  more Pain Location: right shoulder/arm Pain Descriptors / Indicators: Sore Pain Intervention(s): Monitored during session;Limited activity within patient's tolerance    Home Living Family/patient expects to be discharged to:: Private residence Living Arrangements: Alone Available Help at Discharge: Family;Available PRN/intermittently Type of Home: Apartment Home Access: Stairs to enter   Entrance Stairs-Number of Steps: 1/1 Home Layout: One level Home Equipment: Walker - 2 wheels;Cane - single point;Bedside commode(riser with handles) Additional Comments: unable to obtain accurate PLOF info from pt, pt fixated on food and drink from [employer]    Prior Function Level of Independence: Independent with assistive device(s)         Comments: unable to obtain accurate PLOF info from pt, pt fixated on food and drink from [employer]     Hand Dominance   Dominant Hand: Right    Extremity/Trunk Assessment   Upper Extremity Assessment Upper Extremity Assessment: Defer to OT evaluation    Lower Extremity Assessment Lower Extremity Assessment: RLE deficits/detail;LLE deficits/detail RLE Deficits / Details: AROM limied by pain, grossly 4-/5 overall RLE Coordination: decreased fine motor LLE Deficits / Details: gross flexion 2+, gross extension 2+ to 3-, movement in synergy LLE Sensation: decreased light touch LLE Coordination: decreased fine motor;decreased gross motor    Cervical / Trunk Assessment Cervical / Trunk Assessment: Kyphotic  Communication   Communication: No difficulties  Cognition Arousal/Alertness: Awake/alert Behavior  During Therapy: Anxious Overall Cognitive Status: Impaired/Different from baseline Area of Impairment: Orientation;Attention;Memory;Following commands;Problem solving;Awareness;Safety/judgement                 Orientation Level: Disoriented to;Time;Situation Current Attention Level: Sustained   Following Commands: Follows one step commands with  increased time;Follows one step commands inconsistently Safety/Judgement: Decreased awareness of deficits;Decreased awareness of safety Awareness: Intellectual Problem Solving: Requires verbal cues;Decreased initiation General Comments: unable to obtain accurate  info from pt, pt fixated on food and drink from [employer]      General Comments General comments (skin integrity, edema, etc.): notable inattension to L side.  Maximal cuing to get pt's eyes left of midline.    Exercises     Assessment/Plan    PT Assessment Patient needs continued PT services  PT Problem List Decreased strength;Decreased activity tolerance;Decreased balance;Decreased mobility;Pain;Decreased knowledge of use of DME;Decreased knowledge of precautions       PT Treatment Interventions DME instruction;Functional mobility training;Therapeutic activities;Balance training;Neuromuscular re-education;Patient/family education    PT Goals (Current goals can be found in the Care Plan section)  Acute Rehab PT Goals Patient Stated Goal: none stated PT Goal Formulation: Patient unable to participate in goal setting Time For Goal Achievement: 01/12/17 Potential to Achieve Goals: Good    Frequency Min 3X/week   Barriers to discharge Decreased caregiver support      Co-evaluation               AM-PAC PT "6 Clicks" Daily Activity  Outcome Measure Difficulty turning over in bed (including adjusting bedclothes, sheets and blankets)?: Unable Difficulty moving from lying on back to sitting on the side of the bed? : Unable Difficulty sitting down on and standing up from a chair with arms (e.g., wheelchair, bedside commode, etc,.)?: Unable Help needed moving to and from a bed to chair (including a wheelchair)?: Total Help needed walking in hospital room?: Total Help needed climbing 3-5 steps with a railing? : Total 6 Click Score: 6    End of Session   Activity Tolerance: Patient tolerated treatment well Patient  left: in bed;with call bell/phone within reach;with bed alarm set;with family/visitor present Nurse Communication: Mobility status PT Visit Diagnosis: Hemiplegia and hemiparesis;Muscle weakness (generalized) (M62.81);Pain Hemiplegia - Right/Left: Left(both) Pain - Right/Left: (both) Pain - part of body: Shoulder(back)    Time: [id number] PT Time Calculation (min) (ACUTE ONLY): 31 min   Charges:   PT Evaluation $PT Eval Moderate Complexity: 1 Mod PT Treatments $Therapeutic Activity: 8-22 mins   PT G Codes:        12/29/2016  Roy BingKen Kailyn Vanderslice, PT [phone]  (pager)  Eliseo GumKenneth V Chong Wojdyla 12/29/2016, 6:08 PM

## 2016-12-29 NOTE — Progress Notes (Signed)
Nutrition Follow-up  DOCUMENTATION CODES:   Obesity unspecified  INTERVENTION:   -Continue Magic Cup TID with meals -Continue Hormel Shake TID with meals  NUTRITION DIAGNOSIS:   Inadequate oral intake related to dysphagia as evidenced by meal completion < 50%.  Ongoing  GOAL:   Patient will meet greater than or equal to 90% of their needs  Progressing  MONITOR:   PO intake, Supplement acceptance, Diet advancement, Labs, Weight trends, Skin, I & O's  REASON FOR ASSESSMENT:   Low Braden    ASSESSMENT:   Alison Doyle  is a 81 y.o. female, with past medical history significant for diabetes mellitus and who resides home alone, was brought for evaluation for altered mental status and fever and probably fall.  SLP re-evaluated today; to continue with dysphagia 1 diet with honey thick liquids due to lethargy and self-feeding difficulty.   Pt intermittently interactive at time of visit; requesting hot dogs and able to recognize son at bedside. Son provided most of the hx; he reports poor oral intake over the past 3 months. He shares pt is on a fixed income and receives meals on wheels, however, often does not eat meals delivered. Pt usually consumes 1 large meal per day, which consists of fast food items. Pt son also reports that pt had difficulty with liquid bowel movements PTA, however, this has improved since admission.   Pt son endorses weight loss, however, unable to provide time frame or amount of weight loss.Pt unsure of UBW. However, wt hx does not indicate wt loss.   Pt son reports that pt has not been eating well, but drinking a lot of fluids. He is hopeful for diet advancement and suspects pt will consume more PO's when diet is advanced.   Labs reviewed: K: 3.2, CGS: 172-240 (inpatient orders for glycemic control are 0-5 units insulin aspart q HS, 0-9 units insulin aspart TID with meals, and 7 units insulin glargine daily).   NUTRITION - FOCUSED PHYSICAL EXAM:    Most  Recent Value  Orbital Region  No depletion  Upper Arm Region  No depletion  Thoracic and Lumbar Region  No depletion  Buccal Region  No depletion  Temple Region  No depletion  Clavicle Bone Region  No depletion  Clavicle and Acromion Bone Region  No depletion  Scapular Bone Region  No depletion  Dorsal Hand  No depletion  Patellar Region  No depletion  Anterior Thigh Region  No depletion  Posterior Calf Region  No depletion  Edema (RD Assessment)  Mild  Hair  Reviewed  Eyes  Reviewed  Mouth  Reviewed  Skin  Reviewed  Nails  Reviewed       Diet Order:  DIET - DYS 1 Room service appropriate? Yes; Fluid consistency: Honey Thick  EDUCATION NEEDS:   No education needs have been identified at this time  Skin:  Skin Assessment: Reviewed RN Assessment  Last BM:  12/28/16  Height:   Ht Readings from Last 1 Encounters:  12/26/16 5\' 8"  (1.727 m)    Weight:   Wt Readings from Last 1 Encounters:  12/28/16 212 lb 1.3 oz (96.2 kg)    Ideal Body Weight:  63.6 kg  BMI:  Body mass index is 32.25 kg/m.  Estimated Nutritional Needs:   Kcal:  1700-1900  Protein:  95-110 grams  Fluid:  1.7-1.9 L    Alison Doyle, RD, LDN, CDE Pager: [phone] After hours Pager: [phone]

## 2016-12-29 NOTE — Progress Notes (Signed)
PHARMACY - PHYSICIAN COMMUNICATION CRITICAL VALUE ALERT - BLOOD CULTURE IDENTIFICATION (BCID)  Results for orders placed or performed during the hospital encounter of 12/26/16  Blood Culture ID Panel (Reflexed) (Collected: 12/26/2016  5:28 PM)  Result Value Ref Range   Enterococcus species NOT DETECTED NOT DETECTED   Listeria monocytogenes NOT DETECTED NOT DETECTED   Staphylococcus species DETECTED (A) NOT DETECTED   Staphylococcus aureus NOT DETECTED NOT DETECTED   Methicillin resistance DETECTED (A) NOT DETECTED   Streptococcus species NOT DETECTED NOT DETECTED   Streptococcus agalactiae NOT DETECTED NOT DETECTED   Streptococcus pneumoniae NOT DETECTED NOT DETECTED   Streptococcus pyogenes NOT DETECTED NOT DETECTED   Acinetobacter baumannii NOT DETECTED NOT DETECTED   Enterobacteriaceae species NOT DETECTED NOT DETECTED   Enterobacter cloacae complex NOT DETECTED NOT DETECTED   Escherichia coli NOT DETECTED NOT DETECTED   Klebsiella oxytoca NOT DETECTED NOT DETECTED   Klebsiella pneumoniae NOT DETECTED NOT DETECTED   Proteus species NOT DETECTED NOT DETECTED   Serratia marcescens NOT DETECTED NOT DETECTED   Haemophilus influenzae NOT DETECTED NOT DETECTED   Neisseria meningitidis NOT DETECTED NOT DETECTED   Pseudomonas aeruginosa NOT DETECTED NOT DETECTED   Candida albicans NOT DETECTED NOT DETECTED   Candida glabrata NOT DETECTED NOT DETECTED   Candida krusei NOT DETECTED NOT DETECTED   Candida parapsilosis NOT DETECTED NOT DETECTED   Candida tropicalis NOT DETECTED NOT DETECTED    Name of physician (or Provider) Contacted: Dr. Mahala MenghiniSamtani  Changes to prescribed antibiotics required: Repeat BCx's from 11/8 have turned positive for presumptive MRSE. Patient already on vancomycin due to previously positive blood cultures from 11/6. Likely needs ID involvement with persistent bacteremia.   Pollyann SamplesAndy Joquan Lotz, PharmD, BCPS 12/29/2016, 4:48 PM

## 2016-12-29 NOTE — Progress Notes (Signed)
  Speech Language Pathology Treatment: Dysphagia  Patient Details Name: Alison Doyle Checketts MRN: [id number] DOB: 1935/08/19 Today's Date: 12/29/2016 Time: [id number] SLP Time Calculation (min) (ACUTE ONLY): 15 min  Assessment / Plan / Recommendation Clinical Impression  Pt required max-total assist for feeding due to left hand paresis and significant right arm/shoulder pain; eventually able to use right arm after repositioning. Labial leakage/residue but no cough, throat clear or wet vocal quality with puree and honey liquids via teaspoon. Dentures present and donned however given oral weakness, recommend continue puree over the weekend and will continue to determine appropriateness to upgrade from puree.    HPI HPI: [name] [id number] y.o.female,with past medical history significant for diabetes mellitus was brought for evaluation for altered mental status, fever and suspected fall. Found to have UTI and CT of the head showedevolving acute to subacute posterior right MCA infarct involving the posterior right insula, right temporal and parietal lobes, stable right subdural hematoma since yesterday. Trace associated right temporal lobe subarachnoid hemorrhage. CXR enlargement of cardiac silhouette with question mild diffuse pulmonary edema.       SLP Plan  Continue with current plan of care  Patient needs continued Speech Lanaguage Pathology Services    Recommendations  Diet recommendations: Dysphagia 1 (puree);Honey-thick liquid Liquids provided via: Teaspoon Medication Administration: Crushed with puree Supervision: Staff to assist with self feeding;Full supervision/cueing for compensatory strategies Compensations: Slow rate;Small sips/bites;Hard cough after swallow Postural Changes and/or Swallow Maneuvers: Seated upright 90 degrees                Oral Care Recommendations: Oral care BID Follow up Recommendations: Inpatient Rehab SLP Visit Diagnosis: Dysphagia, oropharyngeal phase  (R13.12) Attention and concentration deficit following: Cerebral infarction Plan: Continue with current plan of care       GO                [name], Johndavid Geralds Willis 12/29/2016, 10:16 AM  Breck CoonsLisa Willis Lonell FaceLitaker M.Ed [employer]P Pager [phone]

## 2016-12-29 NOTE — Consult Note (Signed)
            [facility]  12/29/2016  Alison GanjaJudy C Doyle 1935-03-17 [id number]   Went to see patientat the bedsideto identify possible discharge needs but physical therapist is currently working with her.  Will attemptto see patient at another time when she is available in the room.    Addendum (01/01/17):  Went back to see patient in the room today to identify possible discharge needs but patient was leaving for a procedure (MRI). Nofamily members notedat the bedside at this time.  Per chart review, primary care provider is Dr. Merri BrunetteWalter Pharr [facility] and anticipated plan for discharge is [facility] vs. skilled nursing facility (SNF) per therapy notes.   Will attempt to meetwith patient at another timewhen she is available.    Addendum: (01/03/17):  Went back to see patient in the room but therapists are both currently working with patient at this moment.  Per Inpatient social worker note, patient's son had expressed concern regarding his mother being able to continue living at home, even though they have hired someone to assist her at home. Son does not feel his mom can return home and wants LTC (Long term care placement) after rehabilitation. Furthermore, son appears to understand his mother's medical issues and current need for rehabilitation and eventually long term care (LTC) as he does not feel his mom can continue to live independently.   For additional questions please contact:  Karin GoldenLorraine A. Sahian Kerney, BSN, RN-BC [facility] PRIMARY [facility] Navigator Cell: [phone]

## 2016-12-29 NOTE — Evaluation (Signed)
Speech Language Pathology Evaluation Patient Details Name: Alison Doyle MRN: [id number] DOB: September 18, 1935 Today's Date: 12/29/2016 Time: [id number]    Problem List:  Patient Active Problem List   Diagnosis Date Noted  . Subarachnoid hemorrhage 12/28/2016  . Acute pain of right shoulder   . Shoulder pain, left   . Epidural hematoma (HCC)   . Cerebrovascular accident (CVA) due to embolism of right middle cerebral artery (HCC)   . Paroxysmal atrial fibrillation (HCC)   . Diabetes mellitus (HCC)   . Sepsis (HCC) 12/26/2016  . Toxic encephalopathy   . Urinary tract infection without hematuria    Past Medical History:  Past Medical History:  Diagnosis Date  . Diabetes mellitus    Past Surgical History:  Past Surgical History:  Procedure Laterality Date  . REPLACEMENT TOTAL KNEE Right 2001   HPI:  [name] [age] y.o.female,with past medical history significant for diabetes mellitus was brought for evaluation for altered mental status, fever and suspected fall. Found to have UTI and CT of the head showedevolving acute to subacute posterior right MCA infarct involving the posterior right insula, right temporal and parietal lobes, stable right subdural hematoma since yesterday. Trace associated right temporal lobe subarachnoid hemorrhage. CXR enlargement of cardiac silhouette with question mild diffuse pulmonary edema.    Assessment / Plan / Recommendation Clinical Impression  Ms. Suzie Portelaayne is a 81 yr old s/p right posterior MCA CVA exhibiting cranial nerve VII damage resulting in decreased left facial ROM. Significant left neglect with head typically rotated to her right side. Intellectual, emergent and anticipatory awareness is decreased. She is oriented x 4 requiring assistance with date and time of day. Pt scored in the average range on 4 word recall test, however noted to have decreased recall of information informally during assessment. Cueing was required intermittently for basic  verbal probem solving situations. Speech is dysarthric although mostly intelligible in conversation. Inpatient rehab would assist pt in moving toward independence of cognition and speech.    SLP Assessment  SLP Recommendation/Assessment: Patient needs continued Speech Lanaguage Pathology Services SLP Visit Diagnosis: Dysarthria and anarthria (R47.1);Attention and concentration deficit;Cognitive communication deficit (R41.841) Attention and concentration deficit following: Cerebral infarction    Follow Up Recommendations  Inpatient Rehab    Frequency and Duration min 2x/week  2 weeks      SLP Evaluation Cognition  Overall Cognitive Status: Impaired/Different from baseline Arousal/Alertness: Awake/alert Orientation Level: Oriented X4 Attention: Sustained Sustained Attention: Impaired Sustained Attention Impairment: Verbal basic;Functional basic Memory: Impaired Memory Impairment: Retrieval deficit;Prospective memory;Decreased recall of new information Awareness: Impaired Awareness Impairment: Intellectual impairment;Emergent impairment;Anticipatory impairment Problem Solving: Impaired Problem Solving Impairment: Verbal basic Executive Function: Sequencing;Organizing Behaviors: Poor frustration tolerance Safety/Judgment: Impaired       Comprehension  Auditory Comprehension Overall Auditory Comprehension: Appears within functional limits for tasks assessed Interfering Components: Attention;Working memory;Pain;Visual impairments EffectiveTechniques: Repetition CounsellorVisual Recognition/Discrimination Discrimination: Not tested Reading Comprehension Reading Status: (TBA)    Expression Expression Primary Mode of Expression: Verbal Verbal Expression Overall Verbal Expression: Appears within functional limits for tasks assessed Initiation: No impairment Level of Generative/Spontaneous Verbalization: Conversation Repetition: No impairment Naming: No impairment Pragmatics:  Impairment Impairments: Eye contact Interfering Components: Attention Written Expression Dominant Hand: Right Written Expression: (TBA)   Oral / Motor  Oral Motor/Sensory Function Overall Oral Motor/Sensory Function: Moderate impairment Facial ROM: Reduced left;Suspected CN VII (facial) dysfunction Facial Symmetry: Abnormal symmetry left;Suspected CN VII (facial) dysfunction Facial Strength: Reduced left;Suspected CN VII (facial) dysfunction Lingual Symmetry: Within Functional Limits Lingual Strength: Reduced Motor  Speech Overall Motor Speech: Impaired Respiration: Within functional limits Phonation: Normal Resonance: Within functional limits Articulation: Within functional limitis Intelligibility: Intelligibility reduced Word: 75-100% accurate Phrase: 75-100% accurate Sentence: 75-100% accurate Conversation: 75-100% accurate Motor Planning: Witnin functional limits   GO                    [name], Whitt Auletta Willis 12/29/2016, 10:11 AM Breck CoonsLisa Willis Lonell FaceLitaker M.Ed [employer] Pager [phone]

## 2016-12-29 NOTE — Progress Notes (Signed)
PROGRESS NOTE    Alison Doyle  WUJ:[id number] DOB: Mar 30, 1935 DOA: 12/26/2016 PCP: Merri BrunettePharr, Walter, MD   Specialists:  none   Brief Narrative:   81 year old female Diabetes mellitus type 2 Osteoarthritis Hyperlipidemia Prior cholecystitis status post ERCP 2007  Right total knee replacement  Recently treated in emergency room 08/31/2016 for urinary tract infection with Keflex  Recently treated in emergency room 08/31/2016 for urinary tract infection with Keflex  Admitted to hospital 12/26/2016 with altered mental status probable fall found last normal 5 PM of 11/6 face down, lactic acid was >4 and she was febrile --noted to have some slurred speech and right-sided facial droop and some posturing of the right arm UTI as well as subdural hematoma was found and she was started on antibiotics for altered mental status presumably because of both  Developed new onset atrial fibrillation with RVR over the course of the night on admission    Assessment & Plan:   Active Problems:   Sepsis (HCC)   Subdural hematoma as well as fall L MCA cva noted on Ct head 11/7  Neurosurgery input appreciated-holding for now aspirin 81 as per neurologist  Dys 1 diet/honey liquids + thickener as per SLP-mbs 11/7  Further work-up carotids pending still  SLP has seen, cleared for dys 1, honey thick diet-ordered Cognitive eval as well  xrays of shoulder/humerus are neg from 11/8   Atrial fibrillation with RVR, new onset as of 12/26/2016 Chads score >5  Echo ef 55-60%, slight inc PASP 34  On Cardizem Gtt--will convert to PO XL Cardizem 120 and observe--realtively controlled and will continue without adjustment  anticoagulation eventually going forward-defer discussion to NS/Neuro given Subdural hematoma  Mild hypokalemia  K 3.0-->3.2, on Kdur 40 qd   Mag 1.7 so adding MAgOx 400 bid--recheck   Diarrhea Await further diarrhea stool--if none, then no need to test for Cdiff  Possible sepsis with  lactic acidosis of 4 White count on admission 13.8 Risk of aspiration given confirmed stroke  Get 1 vw CXR 11/8  IV vancomycin and Zosyn on admission--BC growing 2/2 of coag neg staphy  Continue IV saline 100 cc/h for now--WBc improved 13--->10  May require TEE--would reculture and re-assess  Diabetes mellitus type 2, A1c this admit 8.7  On metformin at home-? cause lactic acidosis, holding liraglutide  Will need insulin discussion and teaching this admit  Sugars 201-244  Adding lantus 7 q am    Current daily smoker  With assess when more awake alert  Chronic low back pain and osteoarthritis  holding tramadol 50-100 every 4 as needed  Hyperlipidemia  Change pravachol 20--->80 MG high intensity dosing    Place on SCD-hold Lovenoex until seen Inpatient on stepdown   Consultants:   None  Procedures:   None  Antimicrobials:   Vancomycin/Zosyn 11/7   Subjective:  Awake alert muhc more coherent  In nad at present time Can tell me she is in [place] and in the Hospital Pain in R Elbow Minimally able to move UE's on L side Drinking fair no overnight issues per RN  Objective: Vitals:   12/27/16 0609 12/27/16 0617 12/27/16 0657 12/27/16 0721  BP:    121/69  Pulse: (!) 125 (!) 134 98 (!) 106  Resp: (!) 23 (!) 26 (!) 24 (!) 25  Temp:    99 F (37.2 C)  TempSrc:    Oral  SpO2: 99% 98% 99% 98%  Weight:      Height:  Intake/Output Summary (Last 24 hours) at 12/27/2016 0731 Last data filed at 12/27/2016 0543 Gross per 24 hour  Intake 3521 ml  Output 1900 ml  Net 1621 ml   Filed Weights   12/26/16 1651 12/26/16 2127  Weight: 90.7 kg (200 lb) 96 kg (211 lb 10.3 oz)    Examination:  Awake alert  cta b, no added sound abd soft nt nd  R elbow has abrasion over I t and is tedner--ROM intact s1 s2 irreg but rate controlled LUE has some contractures and minimally movement LLE cannot raise Still has some  Bruising over R face  Data Reviewed: I have  personally reviewed following labs and imaging studies  CBC: Recent Labs  Lab 12/26/16 1653 12/27/16 0232  WBC 13.8* 13.6*  NEUTROABS 11.7*  --   HGB 15.6* 14.7  HCT 47.8* 45.5  MCV 85.8 86.2  PLT 221 189   Basic Metabolic Panel: Recent Labs  Lab 12/26/16 1653 12/27/16 0232  NA 137 137  K 4.2 3.0*  CL 105 105  CO2 19* 22  GLUCOSE 329* 274*  BUN 12 10  CREATININE 0.94 0.83  CALCIUM 8.6* 8.1*   GFR: Estimated Creatinine Clearance: 67.6 mL/min (by C-G formula based on SCr of 0.83 mg/dL). Liver Function Tests: Recent Labs  Lab 12/26/16 1653 12/27/16 0232  AST 39 43*  ALT 15 17  ALKPHOS 97 81  BILITOT 0.8 0.9  PROT 6.7 5.9*  ALBUMIN 3.3* 2.9*   No results for input(s): LIPASE, AMYLASE in the last 168 hours. No results for input(s): AMMONIA in the last 168 hours. Coagulation Profile: Recent Labs  Lab 12/26/16 1653  INR 1.13   Cardiac Enzymes: Recent Labs  Lab 12/26/16 2156  CKTOTAL 2,293*   BNP (last 3 results) No results for input(s): PROBNP in the last 8760 hours. HbA1C: No results for input(s): HGBA1C in the last 72 hours. CBG: Recent Labs  Lab 12/26/16 1645 12/26/16 2128  GLUCAP 339* 343*   Lipid Profile: No results for input(s): CHOL, HDL, LDLCALC, TRIG, CHOLHDL, LDLDIRECT in the last 72 hours. Thyroid Function Tests: No results for input(s): TSH, T4TOTAL, FREET4, T3FREE, THYROIDAB in the last 72 hours. Anemia Panel: No results for input(s): VITAMINB12, FOLATE, FERRITIN, TIBC, IRON, RETICCTPCT in the last 72 hours. Urine analysis:    Component Value Date/Time   COLORURINE AMBER (A) 12/26/2016 1653   APPEARANCEUR CLOUDY (A) 12/26/2016 1653   LABSPEC 1.027 12/26/2016 1653   PHURINE 5.0 12/26/2016 1653   GLUCOSEU >=500 (A) 12/26/2016 1653   HGBUR LARGE (A) 12/26/2016 1653   BILIRUBINUR NEGATIVE 12/26/2016 1653   KETONESUR 20 (A) 12/26/2016 1653   PROTEINUR >=300 (A) 12/26/2016 1653   UROBILINOGEN 0.2 11/29/2014 2049   NITRITE POSITIVE  (A) 12/26/2016 1653   LEUKOCYTESUR NEGATIVE 12/26/2016 1653     Radiology Studies: Reviewed images personally in health database    Scheduled Meds: . albuterol  2.5 mg Nebulization Q6H  . diltiazem  30 mg Oral Q8H  . insulin aspart  0-15 Units Subcutaneous TID WC  . insulin aspart  0-5 Units Subcutaneous QHS  . insulin aspart  0-9 Units Subcutaneous TID WC  . ipratropium  0.5 mg Nebulization Q6H  . mouth rinse  15 mL Mouth Rinse BID  . metoprolol tartrate  5 mg Intravenous Once   Continuous Infusions: . piperacillin-tazobactam (ZOSYN)  IV 3.375 g (12/27/16 0543)  . vancomycin Stopped (12/27/16 0657)     LOS: 1 day    Time spent: 35  Pleas KochJai Alyssamarie Mounsey, MD [facility]([phone]) [phone]   If 7PM-7AM, please contact night-coverage [email or web address] Password [email or web address] 12/27/2016, 7:31 AM

## 2016-12-30 ENCOUNTER — Inpatient Hospital Stay (HOSPITAL_COMMUNITY): Payer: Medicare Other

## 2016-12-30 DIAGNOSIS — M25512 Pain in left shoulder: Secondary | ICD-10-CM

## 2016-12-30 DIAGNOSIS — G8194 Hemiplegia, unspecified affecting left nondominant side: Secondary | ICD-10-CM

## 2016-12-30 DIAGNOSIS — S064X9A Epidural hemorrhage with loss of consciousness of unspecified duration, initial encounter: Secondary | ICD-10-CM

## 2016-12-30 DIAGNOSIS — S0083XA Contusion of other part of head, initial encounter: Secondary | ICD-10-CM

## 2016-12-30 DIAGNOSIS — F17210 Nicotine dependence, cigarettes, uncomplicated: Secondary | ICD-10-CM

## 2016-12-30 DIAGNOSIS — R414 Neurologic neglect syndrome: Secondary | ICD-10-CM

## 2016-12-30 DIAGNOSIS — S40021A Contusion of right upper arm, initial encounter: Secondary | ICD-10-CM

## 2016-12-30 DIAGNOSIS — Z96651 Presence of right artificial knee joint: Secondary | ICD-10-CM

## 2016-12-30 DIAGNOSIS — R7881 Bacteremia: Secondary | ICD-10-CM | POA: Diagnosis present

## 2016-12-30 DIAGNOSIS — S40022A Contusion of left upper arm, initial encounter: Secondary | ICD-10-CM

## 2016-12-30 DIAGNOSIS — I63411 Cerebral infarction due to embolism of right middle cerebral artery: Principal | ICD-10-CM

## 2016-12-30 DIAGNOSIS — Y92003 Bedroom of unspecified non-institutional (private) residence as the place of occurrence of the external cause: Secondary | ICD-10-CM

## 2016-12-30 DIAGNOSIS — I4891 Unspecified atrial fibrillation: Secondary | ICD-10-CM

## 2016-12-30 DIAGNOSIS — I480 Paroxysmal atrial fibrillation: Secondary | ICD-10-CM

## 2016-12-30 DIAGNOSIS — W1830XA Fall on same level, unspecified, initial encounter: Secondary | ICD-10-CM

## 2016-12-30 LAB — BASIC METABOLIC PANEL
ANION GAP: 6 (ref 5–15)
BUN: 12 mg/dL (ref 6–20)
CALCIUM: 8.2 mg/dL — AB (ref 8.9–10.3)
CO2: 24 mmol/L (ref 22–32)
Chloride: 107 mmol/L (ref 101–111)
Creatinine, Ser: 0.65 mg/dL (ref 0.44–1.00)
GFR calc Af Amer: >60 mL/min (ref >60)
GFR calc non Af Amer: >60 mL/min (ref >60)
GLUCOSE: 163 mg/dL — AB (ref 65–99)
POTASSIUM: 3.3 mmol/L — AB (ref 3.5–5.1)
Sodium: 137 mmol/L (ref 135–145)

## 2016-12-30 LAB — GLUCOSE, CAPILLARY
GLUCOSE-CAPILLARY: 176 mg/dL — AB (ref 65–99)
GLUCOSE-CAPILLARY: 142 mg/dL — AB (ref 65–99)
GLUCOSE-CAPILLARY: 233 mg/dL — AB (ref 65–99)
Glucose-Capillary: 156 mg/dL — ABNORMAL HIGH (ref 65–99)

## 2016-12-30 LAB — MAGNESIUM: Magnesium: 1.8 mg/dL (ref 1.7–2.4)

## 2016-12-30 LAB — VANCOMYCIN, TROUGH: Vancomycin Tr: 14 ug/mL — ABNORMAL LOW (ref 15–20)

## 2016-12-30 MED ORDER — TRAMADOL HCL 50 MG PO TABS
50.0000 mg | ORAL_TABLET | Freq: Four times a day (QID) | ORAL | Status: DC | PRN
  Administered 2016-12-30 – 2017-01-04 (×7): 50 mg via ORAL
  Filled 2016-12-30 (×7): qty 1

## 2016-12-30 MED ORDER — VANCOMYCIN HCL IN DEXTROSE 1-5 GM/200ML-% IV SOLN
1000.0000 mg | Freq: Two times a day (BID) | INTRAVENOUS | Status: AC
  Administered 2016-12-30 – 2017-01-04 (×10): 1000 mg via INTRAVENOUS
  Filled 2016-12-30 (×10): qty 200

## 2016-12-30 MED ORDER — ONDANSETRON HCL 4 MG/2ML IJ SOLN: 4.0000 mg | Freq: Once | INTRAMUSCULAR | Status: DC

## 2016-12-30 MED ORDER — ASPIRIN 81 MG PO CHEW: 81.0000 mg | CHEWABLE_TABLET | Freq: Every day | ORAL | Status: DC

## 2016-12-30 NOTE — Progress Notes (Signed)
Pt mental status change not a alert.  Speaks clearly.  Right arm pain 10/10 had given tylenol 500 mg po.  Vomited X 1 gave zorfran 4 mg IV.  Pts states cannot stand arm pain.  Tylenol ineffective.  Last 2 BP's 102/73, 142/84.  Paged Dr. Selinda OrionSamtani FYI.

## 2016-12-30 NOTE — Consult Note (Signed)
Physical Medicine and Rehabilitation Consult Reason for Consult: Evaluate for inpatient rehabilitation Referring Phsyician: Neysa HotterSamtani Alison Doyle is an 81 y.o. female.   HPI: 81 year old female with prior medical history significant for diabetes, was found face down next to her bed, confused and obtunded. [facility] CT of the head showed right subdural hematoma over the temporal lobe. There is also subarachnoid hemorrhage in the right parietal lobe. Chronic moderate small vessel ischemia periventricular white matter. CTA showed no evidence of large vessel occlusion. CT of the cervical spine showed osteoarthritis in the atlantodental interval, positive cervical degenerative disc, C4-C7 with cervical spondylosis. No evidence of cervical fracture or subluxation, maxillo facial CT showed periorbital contusion, but no facial fractures. Urinalysis showed positive nitrates, many bacteria and positive WBC. MRI of the brain demonstrated right MCA infarct with petechial hemorrhage. In addition, there was evidence of subtle right temporal fracture2-D echo demonstrated normal ejection fraction, mild dilatation, right atrium, left atrium mildly to moderately dilated Patient was noted to be in atrial fibrillation, new onset. Neurology recommended starting Eliquis in 2 weeks. If repeat head CT showed near resolution of hematoma  Review of Systems  Constitutional: Positive for malaise/fatigue. Negative for chills and fever.  HENT: Negative for hearing loss and sore throat.   Eyes: Negative for photophobia, discharge and redness.  Respiratory: Negative for cough, sputum production, shortness of breath and wheezing.   Cardiovascular: Negative for chest pain and leg swelling.  Gastrointestinal: Negative for abdominal pain, heartburn, nausea and vomiting.  Genitourinary: Negative for dysuria and frequency.  Musculoskeletal: Positive for joint pain and neck pain.  Skin: Positive for itching and  rash.  Neurological: Positive for focal weakness.  Endo/Heme/Allergies: Bruises/bleeds easily.  Psychiatric/Behavioral: Positive for memory loss. Negative for hallucinations.    Past Medical History:  Diagnosis Date  . Diabetes mellitus    Past Surgical History:  Procedure Laterality Date  . REPLACEMENT TOTAL KNEE Right 2001   History reviewed. No pertinent family history. Social History:  reports that she has been smoking cigarettes.  She has been smoking about 1.00 pack per day. She uses smokeless tobacco. She reports that she does not drink alcohol or use drugs. Allergies: No Known Allergies Medications Prior to Admission  Medication Sig Dispense Refill  . aspirin EC 81 MG tablet Take 81 mg by mouth daily.    Marland Kitchen. liraglutide (VICTOZA) 18 MG/3ML SOPN Inject 1.2 mg daily into the skin.     . metFORMIN (GLUCOPHAGE) 850 MG tablet Take 850 mg by mouth 2 (two) times daily with a meal.    . pravastatin (PRAVACHOL) 20 MG tablet Take 20 mg by mouth at bedtime.    . traMADol (ULTRAM) 50 MG tablet Take 50-100 mg every 4 (four) hours as needed by mouth (for pain/MAX OF 6 TABLETS IN 24 HOURS).       Home: Home Living Family/patient expects to be discharged to:: Private residence Living Arrangements: Alone Available Help at Discharge: Family, Available PRN/intermittently Type of Home: Apartment Home Access: Stairs to enter Entergy CorporationEntrance Stairs-Number of Steps: 1/1 Home Layout: One level Bathroom Shower/Tub: Health visitorWalk-in shower Bathroom Toilet: Standard Home Equipment: Environmental consultantWalker - 2 wheels, Cane - single point, Bedside commode(riser with handles) Additional Comments: unable to obtain accurate PLOF info from pt, pt fixated on food and drink from [employer]  Functional History: Prior Function Comments: unable to obtain accurate PLOF info from pt, pt fixated on food and drink from [employer] Functional Status:  Mobility:  Ambulation/Gait General Gait Details: NT    ADL: ADL Grooming Details (indicate  cue type and reason): max cues to initiate  Cognition: Cognition Overall Cognitive Status: Impaired/Different from baseline Arousal/Alertness: Awake/alert Orientation Level: Oriented to person, Oriented to place Attention: Sustained Sustained Attention: Impaired Sustained Attention Impairment: Verbal basic, Functional basic Memory: Impaired Memory Impairment: Retrieval deficit, Prospective memory, Decreased recall of new information Awareness: Impaired Awareness Impairment: Intellectual impairment, Emergent impairment, Anticipatory impairment Problem Solving: Impaired Problem Solving Impairment: Verbal basic Executive Function: Sequencing, Organizing Behaviors: Poor frustration tolerance Safety/Judgment: Impaired Cognition Arousal/Alertness: Awake/alert Behavior During Therapy: Anxious Overall Cognitive Status: Impaired/Different from baseline Area of Impairment: Orientation, Attention, Memory, Following commands, Problem solving, Awareness, Safety/judgement Orientation Level: Disoriented to, Time, Situation Current Attention Level: Sustained Following Commands: Follows one step commands with increased time, Follows one step commands inconsistently Safety/Judgement: Decreased awareness of deficits, Decreased awareness of safety Awareness: Intellectual Problem Solving: Requires verbal cues, Decreased initiation General Comments: unable to obtain accurate  info from pt, pt fixated on food and drink from [employer]  Blood pressure (!) 142/84, pulse 82, temperature 98.3 F (36.8 C), temperature source Axillary, resp. rate 16, height 5\' 8"  (1.727 m), weight 95 kg (209 lb 7 oz), SpO2 98 %. Physical Exam  Nursing note and vitals reviewed. Constitutional: She appears well-developed and well-nourished. No distress.  HENT:  Head: Normocephalic and atraumatic.  Eyes: Conjunctivae are normal. Pupils are equal, round, and reactive to light.  Right gaze preference, can be cued toward the left   Neck: No JVD present.  Pain with cervical range of motion,  Cardiovascular: Normal rate. An irregularly irregular rhythm present. Exam reveals no gallop.  No murmur heard. Respiratory: No stridor.  Neurological: She is alert.  Motor strength is 4/5 in the right deltoid 4 minus at the elbow flexor and extensor, limited by pain. 4 minus at the right finger flexors and extensors, 4 minus in the right hip flexor, knee extensor, ankle dorsal flexor. 2 minus at the left deltoid, bicep, triceps, finger flexors and extensors, 3 minus at the left hip flexor, knee extensor, ankle dorsiflexor Sensation absent to light touch in the left upper extremity. There is evidence of left hemi-neglect. Right gaze preference  Skin: She is not diaphoretic.  Psychiatric: Her affect is blunt. Her speech is delayed. She is slowed. Cognition and memory are impaired. She expresses inappropriate judgment.  Poor awareness of stroke deficits.    Results for orders placed or performed during the hospital encounter of 12/26/16 (from the past 24 hour(s))  Glucose, capillary     Status: Abnormal   Collection Time: 12/29/16 11:51 AM  Result Value Ref Range   Glucose-Capillary 240 (H) 65 - 99 mg/dL  Glucose, capillary     Status: Abnormal   Collection Time: 12/29/16  5:01 PM  Result Value Ref Range   Glucose-Capillary 129 (H) 65 - 99 mg/dL  Glucose, capillary     Status: Abnormal   Collection Time: 12/29/16  8:38 PM  Result Value Ref Range   Glucose-Capillary 125 (H) 65 - 99 mg/dL  Basic metabolic panel     Status: Abnormal   Collection Time: 12/30/16  2:39 AM  Result Value Ref Range   Sodium 137 135 - 145 mmol/L   Potassium 3.3 (L) 3.5 - 5.1 mmol/L   Chloride 107 101 - 111 mmol/L   CO2 24 22 - 32 mmol/L   Glucose, Bld 163 (H) 65 - 99 mg/dL   BUN 12 6 - 20 mg/dL  Creatinine, Ser 0.65 0.44 - 1.00 mg/dL   Calcium 8.2 (L) 8.9 - 10.3 mg/dL   GFR calc non Af Amer >60 >60 mL/min   GFR calc Af Amer >60 >60 mL/min    Anion gap 6 5 - 15  Magnesium     Status: None   Collection Time: 12/30/16  2:39 AM  Result Value Ref Range   Magnesium 1.8 1.7 - 2.4 mg/dL  Glucose, capillary     Status: Abnormal   Collection Time: 12/30/16  8:03 AM  Result Value Ref Range   Glucose-Capillary 156 (H) 65 - 99 mg/dL  Vancomycin, trough     Status: Abnormal   Collection Time: 12/30/16 10:06 AM  Result Value Ref Range   Vancomycin Tr 14 (L) 15 - 20 ug/mL   Mr Brain Wo Contrast  Result Date: 12/28/2016 CLINICAL DATA:  81 y/o female with right MCA infarct superimposed on posttraumatic appearing findings including right face and scalp hematoma, and right side extra-axial intracranial hemorrhage. EXAM: MRI HEAD WITHOUT CONTRAST TECHNIQUE: Multiplanar, multiecho pulse sequences of the brain and surrounding structures were obtained without intravenous contrast. COMPARISON:  CTA head and neck and head CTs 02/27/2016. FINDINGS: The examination had to be discontinued prior to completion due to patient refusal to continue. Axial in coronal diffusion weighted imaging was obtained along with axial FLAIR and T2* imaging. Confluent 5 cm area of restricted diffusion in the posterior right MCA territory, worst in the right parietal lobe. Involvement of the posterior right insula. Confluent involvement of the posterior most right corona radiata. Scattered involvement of the right motor strip (series 3, image 42.). No contralateral or posterior fossa restricted diffusion. Along the posterior right operculum area of infarction there is confluent petechial hemorrhage (series 6, image 57). But there is only mild mass effect on the right lateral ventricle and no midline shift. Superimposed 10 mm biconvex extra-axial hemorrhage along the lateral left temporal lobe is now felt to be a small epidural hematoma in light of the subtle nondepressed skull fracture. Axial FLAIR images do suggest there is a trace superimposed broad-based right side subdural hematoma  (series 5, image 13). Patent basilar cisterns. No ventriculomegaly. Mild superimposed bilateral cerebral white matter and left deep cerebellar nuclei FLAIR hyperintensity. IMPRESSION: 1. Confluent acute infarct involving a 5 cm area of the posterior right MCA territory with petechial hemorrhage but only mild regional mass effect. 2. Superimposed right hemisphere combination of epidural (stable, adjacent to the right temporal lobe with associated subtle skull fracture) and trace subdural hematoma. These are thought to be posttraumatic and unrelated to #1. 3.  The examination had to be discontinued prior to completion. Electronically Signed   By: Odessa Fleming M.D.   On: 12/28/2016 15:30   Dg Shoulder Left  Result Date: 12/28/2016 CLINICAL DATA:  Shoulder pain EXAM: LEFT SHOULDER - 2+ VIEW COMPARISON:  Radiograph 12/28/2016, chest x-ray 07/09/2011 FINDINGS: There is mild deformity of the left humeral head which appears chronic, possibly due to remote fracture. No acute displaced fracture or dislocation is evident. Narrowing of the subacromial space suggests rotator cuff disease. Left lung apex clear IMPRESSION: 1. Chronic deformity of the left humeral head 2. Narrowed subacromial space suggests rotator cuff disease Electronically Signed   By: Jasmine Pang M.D.   On: 12/28/2016 19:35    Assessment/Plan: Diagnosis: Right MCA infarct with left upper extremity greater than lower extremity paresis. Also, with left neglect and poor awareness of deficits. 1. Does the need for close, 24 hr/day  medical supervision in concert with the patient's rehab needs make it unreasonable for this patient to be served in a less intensive setting? Yes 2. Co-Morbidities requiring supervision/potential complications: Small subdural hematoma, right temporal, cervical pain with CT evidence of spondylosis and degenerative disc, right upper extremity pain after fall, diabetes, dysphagia secondary to stroke 3. Due to bladder management,  bowel management, safety, skin/wound care, disease management, medication administration, pain management and patient education, does the patient require 24 hr/day rehab nursing? Yes 4. Does the patient require coordinated care of a physician, rehab nurse, PT (1-2 hrs/day, 5 days/week), OT (1-2 hrs/day, 5 days/week) and SLP (.5-1 hrs/day, 5 days/week) to address physical and functional deficits in the context of the above medical diagnosis(es)? Yes Addressing deficits in the following areas: balance, endurance, locomotion, strength, transferring, bowel/bladder control, bathing, dressing, feeding, grooming, toileting, cognition, swallowing and psychosocial support 5. Can the patient actively participate in an intensive therapy program of at least 3 hrs of therapy per day at least 5 days per week? Yes 6. The potential for patient to make measurable gains while on inpatient rehab is good 7. Anticipated functional outcomes upon discharge from inpatients are min assist PT, min assist OT, safe by mouth intake of fluids and solids. SLP 8. Estimated rehab length of stay to reach the above functional goals is: 18-22 days 9. Does the patient have adequate social supports to accommodate these discharge functional goals? Potentially 10. Anticipated D/C setting: Home 11. Anticipated post D/C treatments: HH therapy 12. Overall Rehab/Functional Prognosis: good  RECOMMENDATIONS: This patient's condition is appropriate for continued rehabilitative care in the following setting: CIR Patient has agreed to participate in recommended program. Potentially Note that insurance prior authorization may be required for reimbursement for recommended care.  Comment: Recommend right elbow x-ray given fall and pain particularly in the lateral epicondyle   Sona Nations E 12/30/2016

## 2016-12-30 NOTE — Consult Note (Signed)
[facility]    Date of Admission:  12/26/2016           Day 5 vancomycin       Reason for Consult: Coagulase-negative staph bacteremia    Referring Provider: Dr. Pleas Koch  Assessment: She has methicillin-resistant coagulase-negative staph bacteremia in the setting of acute right middle cerebral artery CVA. I suspect that the stroke is related to her atrial fibrillation and the bacteremia is secondary to having been down on the floor with multiple skin contusions. There is no evidence of endocarditis by transthoracic echocardiogram. Her repeat blood cultures are growing gram-positive cocci in clusters in one of 2 sets. I will repeat blood cultures again today. If they are again positive I will consider TEE next week.   Plan: 1. Continue vancomycin 2. Repeat blood cultures 3. We will follow-up on Monday, 01/01/2017   Principal Problem:   Cerebrovascular accident (CVA) due to embolism of right middle cerebral artery (HCC) Active Problems:   Coagulase negative Staphylococcus bacteremia   Sepsis (HCC)   Subarachnoid hemorrhage   Acute pain of right shoulder   Shoulder pain, left   Epidural hematoma (HCC)   Paroxysmal atrial fibrillation (HCC)   Diabetes mellitus (HCC)   Scheduled Meds: . diltiazem  180 mg Oral Daily  . heparin subcutaneous  5,000 Units Subcutaneous Q8H  . insulin aspart  0-5 Units Subcutaneous QHS  . insulin aspart  0-9 Units Subcutaneous TID WC  . insulin glargine  7 Units Subcutaneous Daily  . magnesium oxide  400 mg Oral BID  . mouth rinse  15 mL Mouth Rinse BID  . nicotine  21 mg Transdermal Daily  . ondansetron (ZOFRAN) IV  4 mg Intravenous Once  . pneumococcal 23 valent vaccine  0.5 mL Intramuscular Tomorrow-1000  . potassium chloride  40 mEq Oral Daily  . pravastatin  80 mg Oral QHS   Continuous Infusions: . sodium chloride 50 mL/hr at 12/30/16 0232  . vancomycin 750 mg (12/30/16 1122)   PRN  Meds:.acetaminophen, ipratropium-albuterol, ondansetron **OR** ondansetron (ZOFRAN) IV, RESOURCE THICKENUP CLEAR, traMADol, zolpidem  HPI: Alison Doyle is a 81 y.o. female progress in her usual state of health around 5 PM on 12/25/2016. She was found down on the floor bedroom by her son 24 hours later. She was not moving her left side. She had ecchymoses on her face and arms. She was brought to the emergency room where she was found to be febrile with a temperature of 101.9. Brain stance revealed an acute right MCA CVA and small subdural hematomas. 2 of 2 admission blood cultures have grown methicillin-resistant coagulase-negative staph. She is complaining of right shoulder pain. She tells me that she was feeling well before she tripped and fell in her bedroom. She denies having any recent fever, chills or sweats. She was also found to be in atrial fibrillation with rapid ventricular response upon admission.    Review of Systems: Review of Systems  Constitutional: Negative for fever.       She is not able to give a full review of systems.  Musculoskeletal: Positive for joint pain.  Neurological: Positive for focal weakness.    Past Medical History:  Diagnosis Date  . Diabetes mellitus     Social History   Tobacco Use  . Smoking status: Current Every Day Smoker    Packs/day: 1.00    Types: Cigarettes  . Smokeless tobacco: Current User  Substance Use  Topics  . Alcohol use: No  . Drug use: No    History reviewed. No pertinent family history. No Known Allergies  OBJECTIVE: Blood pressure (!) 142/84, pulse 82, temperature 98.3 F (36.8 C), temperature source Axillary, resp. rate 16, height 5\' 8"  (1.727 m), weight 209 lb 7 oz (95 kg), SpO2 98 %.  Physical Exam  Constitutional: She is oriented to person, place, and time.  She is alert. She is in mild distress asking for help. She is complaining of right shoulder pain.  HENT:  Mouth/Throat: No oropharyngeal exudate.  Eyes:  Conjunctivae are normal.  Cardiovascular:  Irregularly irregular rhythm with no murmur.  Pulmonary/Chest: Effort normal. She has no wheezes. She has no rales.  Abdominal: Soft. There is no tenderness.  Musculoskeletal:  Some pain with range of motion of both shoulders but no redness, warmth or swelling. Healed incision of her right knee from prior arthroplasty.  Neurological: She is alert and oriented to person, place, and time.  Left-sided weakness.  Skin:  Ecchymoses on face and arms.  Psychiatric: Affect normal.    Lab Results Lab Results  Component Value Date   WBC 7.0 12/29/2016   HGB 14.2 12/29/2016   HCT 45.2 12/29/2016   MCV 87.9 12/29/2016   PLT 153 12/29/2016    Lab Results  Component Value Date   CREATININE 0.65 12/30/2016   BUN 12 12/30/2016   NA 137 12/30/2016   K 3.3 (L) 12/30/2016   CL 107 12/30/2016   CO2 24 12/30/2016    Lab Results  Component Value Date   ALT 17 12/27/2016   AST 43 (H) 12/27/2016   ALKPHOS 81 12/27/2016   BILITOT 0.9 12/27/2016     Microbiology: Recent Results (from the past 240 hour(s))  Culture, blood (Routine x 2)     Status: Abnormal   Collection Time: 12/26/16  5:19 PM  Result Value Ref Range Status   Specimen Description BLOOD BLOOD LEFT FOREARM  Final   Special Requests IN PEDIATRIC BOTTLE Blood Culture adequate volume  Final   Culture  Setup Time   Final    GRAM POSITIVE COCCI IN CLUSTERS IN PEDIATRIC BOTTLE CRITICAL VALUE NOTED.  VALUE IS CONSISTENT WITH PREVIOUSLY REPORTED AND CALLED VALUE.    Culture (A)  Final    STAPHYLOCOCCUS SPECIES (COAGULASE NEGATIVE) SUSCEPTIBILITIES PERFORMED ON PREVIOUS CULTURE WITHIN THE LAST 5 DAYS.    Report Status 12/29/2016 FINAL  Final  Culture, blood (Routine x 2)     Status: Abnormal   Collection Time: 12/26/16  5:28 PM  Result Value Ref Range Status   Specimen Description BLOOD LEFT WRIST  Final   Special Requests   Final    BOTTLES DRAWN AEROBIC ONLY Blood Culture adequate  volume   Culture  Setup Time   Final    GRAM POSITIVE COCCI IN CLUSTERS AEROBIC BOTTLE ONLY CRITICAL RESULT CALLED TO, READ BACK BY AND VERIFIED WITH: ADagoberto Ligas. Johnston Pharm.D. 16:55 12/27/16 (wilsonm)    Culture STAPHYLOCOCCUS SPECIES (COAGULASE NEGATIVE) (A)  Final   Report Status 12/29/2016 FINAL  Final   Organism ID, Bacteria STAPHYLOCOCCUS SPECIES (COAGULASE NEGATIVE)  Final      Susceptibility   Staphylococcus species (coagulase negative) - MIC*    CIPROFLOXACIN <=0.5 SENSITIVE Sensitive     ERYTHROMYCIN >=8 RESISTANT Resistant     GENTAMICIN <=0.5 SENSITIVE Sensitive     OXACILLIN RESISTANT Resistant     TETRACYCLINE >=16 RESISTANT Resistant     VANCOMYCIN <=0.5 SENSITIVE Sensitive  TRIMETH/SULFA 80 RESISTANT Resistant     CLINDAMYCIN <=0.25 SENSITIVE Sensitive     RIFAMPIN <=0.5 SENSITIVE Sensitive     Inducible Clindamycin NEGATIVE Sensitive     * STAPHYLOCOCCUS SPECIES (COAGULASE NEGATIVE)  Blood Culture ID Panel (Reflexed)     Status: Abnormal   Collection Time: 12/26/16  5:28 PM  Result Value Ref Range Status   Enterococcus species NOT DETECTED NOT DETECTED Final   Listeria monocytogenes NOT DETECTED NOT DETECTED Final   Staphylococcus species DETECTED (A) NOT DETECTED Final    Comment: Methicillin (oxacillin) resistant coagulase negative staphylococcus. Possible blood culture contaminant (unless isolated from more than one blood culture draw or clinical case suggests pathogenicity). No antibiotic treatment is indicated for blood  culture contaminants. CRITICAL RESULT CALLED TO, READ BACK BY AND VERIFIED WITH: ADagoberto Ligas.D. 16:55 12/27/16 (wilsonm)    Staphylococcus aureus NOT DETECTED NOT DETECTED Final   Methicillin resistance DETECTED (A) NOT DETECTED Final    Comment: CRITICAL RESULT CALLED TO, READ BACK BY AND VERIFIED WITH: ADagoberto Ligas.D. 16:55 12/27/16 (wilsonm)    Streptococcus species NOT DETECTED NOT DETECTED Final   Streptococcus agalactiae NOT  DETECTED NOT DETECTED Final   Streptococcus pneumoniae NOT DETECTED NOT DETECTED Final   Streptococcus pyogenes NOT DETECTED NOT DETECTED Final   Acinetobacter baumannii NOT DETECTED NOT DETECTED Final   Enterobacteriaceae species NOT DETECTED NOT DETECTED Final   Enterobacter cloacae complex NOT DETECTED NOT DETECTED Final   Escherichia coli NOT DETECTED NOT DETECTED Final   Klebsiella oxytoca NOT DETECTED NOT DETECTED Final   Klebsiella pneumoniae NOT DETECTED NOT DETECTED Final   Proteus species NOT DETECTED NOT DETECTED Final   Serratia marcescens NOT DETECTED NOT DETECTED Final   Haemophilus influenzae NOT DETECTED NOT DETECTED Final   Neisseria meningitidis NOT DETECTED NOT DETECTED Final   Pseudomonas aeruginosa NOT DETECTED NOT DETECTED Final   Candida albicans NOT DETECTED NOT DETECTED Final   Candida glabrata NOT DETECTED NOT DETECTED Final   Candida krusei NOT DETECTED NOT DETECTED Final   Candida parapsilosis NOT DETECTED NOT DETECTED Final   Candida tropicalis NOT DETECTED NOT DETECTED Final  MRSA PCR Screening     Status: None   Collection Time: 12/26/16  9:13 PM  Result Value Ref Range Status   MRSA by PCR NEGATIVE NEGATIVE Final    Comment:        The GeneXpert MRSA Assay (FDA approved for NASAL specimens only), is one component of a comprehensive MRSA colonization surveillance program. It is not intended to diagnose MRSA infection nor to guide or monitor treatment for MRSA infections.   Culture, blood (routine x 2)     Status: None (Preliminary result)   Collection Time: 12/28/16  1:30 PM  Result Value Ref Range Status   Specimen Description BLOOD LEFT ANTECUBITAL  Final   Special Requests   Final    BOTTLES DRAWN AEROBIC AND ANAEROBIC Blood Culture adequate volume   Culture  Setup Time   Final    GRAM POSITIVE COCCI IN CLUSTERS ANAEROBIC BOTTLE ONLY CRITICAL RESULT CALLED TO, READ BACK BY AND VERIFIED WITH: A JOHNSTON,PHARMD AT 1641 12/29/16 BY L BENFIELD     Culture   Final    GRAM POSITIVE COCCI CULTURE REINCUBATED FOR BETTER GROWTH    Report Status PENDING  Incomplete  Culture, blood (routine x 2)     Status: None (Preliminary result)   Collection Time: 12/28/16  1:45 PM  Result Value Ref Range Status   Specimen Description  BLOOD LEFT HAND  Final   Special Requests   Final    BOTTLES DRAWN AEROBIC AND ANAEROBIC Blood Culture adequate volume   Culture NO GROWTH 1 DAY  Final   Report Status PENDING  Incomplete    Cliffton AstersJohn Aften Lipsey, MD [facility] [phone] pager   [phone] cell 12/30/2016, 12:02 PM

## 2016-12-30 NOTE — Progress Notes (Signed)
PROGRESS NOTE    Alison Doyle  ZOX:[id number] DOB: [birth year] DOA: 12/26/2016 PCP: [name], MD   Specialists:  none   Brief Narrative:   81 year old female Diabetes mellitus type 2 Osteoarthritis Hyperlipidemia Prior cholecystitis status post ERCP 2007  Right total knee replacement  Recently treated in emergency room 08/31/2016 for urinary tract infection with Keflex  Recently treated in emergency room 08/31/2016 for urinary tract infection with Keflex  Admitted to hospital 12/26/2016 with altered mental status probable fall found last normal 5 PM of 11/6 face down, lactic acid was >4 and she was febrile --noted to have some slurred speech and right-sided facial droop and some posturing of the right arm UTI as well as subdural hematoma was found and she was started on antibiotics for altered mental status presumably because of both  Developed new onset atrial fibrillation with RVR over the course of the night on admission    Assessment & Plan:   Active Problems:   Sepsis (HCC)   Subdural hematoma as well as fall L MCA cva noted on Ct head 11/7  Neurosurgeon amd neurology input appreciated  Dys 1 diet/honey liquids + thickener as per SLP-mbs 11/7  SLP has seen, cleared for dys 1, honey thick diet-ordered Cognitive eval as well  Aspirin 81 be resumed 1 week post stroke in setting of subdural   R arm pain Also chronic low back pain on tramadol at home which was held on admission xrays of shoulder/humerus are neg from 11/8--has pain not relieved by tylenol Added back home tramadol 50 q 6 prn 11/10 Monitor pain for effect On my exam yesterday had reasonable ROM to R elbow--because of 10/10 pain today will get elbow XR   Coagulase-negative bacteremia 3/4 bottles on admission lactic acidosis of 4 White count on admission 13.8  IV vancomycin and Zosyn on admission--BC growing 3/4 of coag neg staphy though only is on vancomycin right now  Infectious disease consulted  and will see in consult  WBc improved 13--->10-->7  Atrial fibrillation with RVR, new onset as of 12/26/2016 Chads score >5  Echo ef 55-60%, slight inc PASP 34  On Cardizem Gtt--initially and then converted to 180 mg  anticoagulation on discharge would be Eliquis in 2 weeks after repeat CT head shows resolved bleeding and  aspirin 81 daily until Eliquis started  Mild hypokalemia  K 3.0-->3.2, on Kdur 40 qd  Magnesium 1.8, loaded with 1 g of IV magnesium 11/11, continue p.o. 400 twice daily as well  Diarrhea Await further diarrhea stool--if none, then no need to test for Cdiff  Diabetes mellitus type 2, A1c this admit 8.7  On metformin at home-? cause lactic acidosis, holding liraglutide  Will need insulin discussion and teaching this admit  Sugars 201-244  Adding lantus 7 q am    Current daily smoker  With assess when more awake alert  Hyperlipidemia  Change pravachol 20--->80 MG high intensity dosing    Place on SCD-hold Lovenoex until seen Inpatient on stepdown   Consultants:   None  Procedures:   None  Antimicrobials:   Vancomycin 11/7-->  Zosyn 11/7   Subjective:  Nursing reports some slurred speech but she is clear to me She is complaining of right arm pain as well as nausea and asking for nausea medication No other chest pain Overall unchanged  Objective: Vitals:   12/27/16 0609 12/27/16 0617 12/27/16 0657 12/27/16 0721  BP:    121/69  Pulse: (!) 125 (!) 134 98 (!) 106  Resp: (!) 23 (!) 26 (!) 24 (!) 25  Temp:    99 F (37.2 C)  TempSrc:    Oral  SpO2: 99% 98% 99% 98%  Weight:      Height:        Intake/Output Summary (Last 24 hours) at 12/27/2016 0731 Last data filed at 12/27/2016 0543 Gross per 24 hour  Intake 3521 ml  Output 1900 ml  Net 1621 ml   Filed Weights   12/26/16 1651 12/26/16 2127  Weight: 90.7 kg (200 lb) 96 kg (211 lb 10.3 oz)    Examination:  Awake alert  cta b, no added sound abd soft nt nd  R elbow has  abrasion over I t and is tedner--ROM intact s1 s2 irreg but rate controlled LUE has some contractures and minimally movement LLE cannot raise Still has some  Bruising over R face  Data Reviewed: I have personally reviewed following labs and imaging studies  CBC: Recent Labs  Lab 12/26/16 1653 12/27/16 0232  WBC 13.8* 13.6*  NEUTROABS 11.7*  --   HGB 15.6* 14.7  HCT 47.8* 45.5  MCV 85.8 86.2  PLT 221 189   Basic Metabolic Panel: Recent Labs  Lab 12/26/16 1653 12/27/16 0232  NA 137 137  K 4.2 3.0*  CL 105 105  CO2 19* 22  GLUCOSE 329* 274*  BUN 12 10  CREATININE 0.94 0.83  CALCIUM 8.6* 8.1*   GFR: Estimated Creatinine Clearance: 67.6 mL/min (by C-G formula based on SCr of 0.83 mg/dL). Liver Function Tests: Recent Labs  Lab 12/26/16 1653 12/27/16 0232  AST 39 43*  ALT 15 17  ALKPHOS 97 81  BILITOT 0.8 0.9  PROT 6.7 5.9*  ALBUMIN 3.3* 2.9*   No results for input(s): LIPASE, AMYLASE in the last 168 hours. No results for input(s): AMMONIA in the last 168 hours. Coagulation Profile: Recent Labs  Lab 12/26/16 1653  INR 1.13   Cardiac Enzymes: Recent Labs  Lab 12/26/16 2156  CKTOTAL 2,293*   BNP (last 3 results) No results for input(s): PROBNP in the last 8760 hours. HbA1C: No results for input(s): HGBA1C in the last 72 hours. CBG: Recent Labs  Lab 12/26/16 1645 12/26/16 2128  GLUCAP 339* 343*   Lipid Profile: No results for input(s): CHOL, HDL, LDLCALC, TRIG, CHOLHDL, LDLDIRECT in the last 72 hours. Thyroid Function Tests: No results for input(s): TSH, T4TOTAL, FREET4, T3FREE, THYROIDAB in the last 72 hours. Anemia Panel: No results for input(s): VITAMINB12, FOLATE, FERRITIN, TIBC, IRON, RETICCTPCT in the last 72 hours. Urine analysis:    Component Value Date/Time   COLORURINE AMBER (A) 12/26/2016 1653   APPEARANCEUR CLOUDY (A) 12/26/2016 1653   LABSPEC 1.027 12/26/2016 1653   PHURINE 5.0 12/26/2016 1653   GLUCOSEU >=500 (A) 12/26/2016  1653   HGBUR LARGE (A) 12/26/2016 1653   BILIRUBINUR NEGATIVE 12/26/2016 1653   KETONESUR 20 (A) 12/26/2016 1653   PROTEINUR >=300 (A) 12/26/2016 1653   UROBILINOGEN 0.2 11/29/2014 2049   NITRITE POSITIVE (A) 12/26/2016 1653   LEUKOCYTESUR NEGATIVE 12/26/2016 1653     Radiology Studies: Reviewed images personally in health database    Scheduled Meds: . albuterol  2.5 mg Nebulization Q6H  . diltiazem  30 mg Oral Q8H  . insulin aspart  0-15 Units Subcutaneous TID WC  . insulin aspart  0-5 Units Subcutaneous QHS  . insulin aspart  0-9 Units Subcutaneous TID WC  . ipratropium  0.5 mg Nebulization Q6H  . mouth rinse  15 mL  Mouth Rinse BID  . metoprolol tartrate  5 mg Intravenous Once   Continuous Infusions: . piperacillin-tazobactam (ZOSYN)  IV 3.375 g (12/27/16 0543)  . vancomycin Stopped (12/27/16 0657)     LOS: 1 day    Time spent: 7735    Pleas KochJai Wisdom Rickey, MD [facility](P) [phone]   If 7PM-7AM, please contact night-coverage [email or web address] Password [facility] 12/27/2016, 7:31 AM

## 2016-12-30 NOTE — Progress Notes (Signed)
Pharmacy Antibiotic Note  Alison Doyle is a 81 y.o. female admitted on 12/26/2016 with sepsis.  Pharmacy consulted on 12/26/16  for vancomycin dosing. She has methicillin-resistant coagulase-negative staph bacteremia in the setting of acute right middle cerebral artery CVA. ID is following, notes that there is no evidence of endocarditis by transthoracic echocardiogram. Repeat blood cultures are growing gram-positive cocci in clusters in one of 2 sets.  Afebrile, Tm 99 and WBC is within normal limits. Renal function is within normal limits (SCr 0.63; CrCl ~65 mL/min).  Vancomycin Trough goal = 15-20 mcg/ml Vanc trough today is 14 mcg/ml on vancomycin 750mg  IV q12h   Plan: Increase Vancomycin to 1000 mg IV q12 hours.  Monitor culture results, clinical picture, renal function, VT as needed   Height: 5\' 8"  (172.7 cm) Weight: 209 lb 7 oz (95 kg) IBW/kg (Calculated) : 63.9  Temp (24hrs), Avg:98.6 F (37 C), Min:98.3 F (36.8 C), Max:99 F (37.2 C)  Recent Labs  Lab 12/26/16 1653 12/26/16 1708 12/26/16 2053 12/27/16 0232 12/28/16 0233 12/29/16 0854 12/30/16 0239 12/30/16 1006  WBC 13.8*  --   --  13.6* 10.2 7.0  --   --   CREATININE 0.94  --   --  0.83 0.74 0.63 0.65  --   LATICACIDVEN  --  4.25* 1.63  --   --   --   --   --   VANCOTROUGH  --   --   --   --   --   --   --  14*    Estimated Creatinine Clearance: 69.8 mL/min (by C-G formula based on SCr of 0.65 mg/dL).    No Known Allergies  Antimicrobials this admission: Zosyn 11/6 >> 11/7 Vancomycin 11/6 >>   Dose adjustments this admission: 11/10 VT = 14 mcg/ml on vancomycin 750mg  IV q12h ; Increase dose to 1000 mg IV q12h  Microbiology results: 11/6 MRSA PCR negative 11/6 Blood x 2 - CoNS in both =MRSE  11/7 BCID - CoNS in 2 of 2 cultures - mecA gene detected 11/8 blood x 2 - GPC in 1 of 2 sets   Thank you for allowing pharmacy to be a part of this patient's care. Noah Delaineuth Lynann Demetrius, RPh Clinical Pharmacist [phone]  [phone] Main pharmacy [phone] 12/30/2016 12:17 PM

## 2016-12-31 LAB — CULTURE, BLOOD (ROUTINE X 2): Special Requests: ADEQUATE

## 2016-12-31 LAB — GLUCOSE, CAPILLARY
GLUCOSE-CAPILLARY: 171 mg/dL — AB (ref 65–99)
GLUCOSE-CAPILLARY: 195 mg/dL — AB (ref 65–99)
GLUCOSE-CAPILLARY: 172 mg/dL — AB (ref 65–99)
Glucose-Capillary: 169 mg/dL — ABNORMAL HIGH (ref 65–99)
Glucose-Capillary: 239 mg/dL — ABNORMAL HIGH (ref 65–99)

## 2016-12-31 LAB — BASIC METABOLIC PANEL
ANION GAP: 6 (ref 5–15)
BUN: 11 mg/dL (ref 6–20)
CO2: 24 mmol/L (ref 22–32)
Calcium: 8.2 mg/dL — ABNORMAL LOW (ref 8.9–10.3)
Chloride: 106 mmol/L (ref 101–111)
Creatinine, Ser: 0.62 mg/dL (ref 0.44–1.00)
GFR calc Af Amer: >60 mL/min (ref >60)
Glucose, Bld: 170 mg/dL — ABNORMAL HIGH (ref 65–99)
POTASSIUM: 3.9 mmol/L (ref 3.5–5.1)
SODIUM: 136 mmol/L (ref 135–145)

## 2016-12-31 MED ORDER — LORAZEPAM 2 MG/ML IJ SOLN
1.0000 mg | Freq: Once | INTRAMUSCULAR | Status: AC
  Administered 2017-01-01: 2 mg via INTRAVENOUS
  Filled 2016-12-31: qty 1

## 2016-12-31 NOTE — Progress Notes (Signed)
PROGRESS NOTE    MERCADIES CO  ZOX:[id number] DOB: 03-19-1935 DOA: 12/26/2016 PCP: [name], MD   Specialists:  none   Brief Narrative:   81 year old female Diabetes mellitus type 2 Osteoarthritis Hyperlipidemia Prior cholecystitis status post ERCP 2007  Right total knee replacement  Recently treated in emergency room 08/31/2016 for urinary tract infection with Keflex  Recently treated in emergency room 08/31/2016 for urinary tract infection with Keflex  Admitted to hospital 12/26/2016 with altered mental status probable fall found last normal 5 PM of 11/6 face down, lactic acid was >4 and she was febrile --noted to have some slurred speech and right-sided facial droop and some posturing of the right arm UTI as well as subdural hematoma was found and she was started on antibiotics for altered mental status presumably because of both  Developed new onset atrial fibrillation with RVR over the course of the night on admission    Assessment & Plan:   Active Problems:   Sepsis (HCC)   Subdural hematoma as well as fall L MCA cva noted on Ct head 11/7  Neurosurgeon amd neurology input appreciated  Dys 1 diet/honey liquids + thickener as per SLP-mbs 11/7  SLP has seen, cleared for dys 1, honey thick diet-ordered Cognitive eval as well  Aspirin 81 be resumed 1 week post stroke in setting of subdural  Saline lock IV 11/11   R arm pain Also chronic low back pain on tramadol at home which was held on admission xrays of shoulder/humerus are neg from 11/8--has pain not relieved by tylenol Added back home tramadol 50 q 6 prn 11/10-pain is moderately improved 11/10 exam reasonable ROM to R elbow--x-ray right elbow repeated no fracture She is still guarding her R shoulder and al;though she can move passively, scream in pain when arm is raised or internal rotation--we will ensure no tendon rupture or muscle tear with MRI   Coagulase-negative bacteremia 3/4 bottles on admission  lactic acidosis of 4 White count on admission 13.8  IV vancomycin and Zosyn on admission--BC growing 3/4 of coag neg staphy though only is on vancomycin right now  Infectious disease input from 11/10 is appreciated and we are waiting recultures of 11/10 blood cultures to determine duration of antibiotics  WBc improved 13--->10-->7, repeat labs in a.m.  Atrial fibrillation with RVR, new onset as of 12/26/2016 Chads score >5  Echo ef 55-60%, slight inc PASP 34  On Cardizem Gtt--initially and then converted to 180 mg  anticoagulation on discharge would be Eliquis in 2 weeks after repeat CT head  shows resolved bleeding  See discussion above regarding aspirin   Mild hypokalemia  K 3.0-->3.2, on Kdur 40 qd  Magnesium 1.8, loaded with 1 g of IV magnesium 11/11, continue p.o. 400 twice daily as well  We will repeat magnesium and renal panel a.m.  Diarrhea Await further diarrhea stool--if none, then no need to test for Cdiff  Diabetes mellitus type 2, A1c this admit 8.7  On metformin at home-? cause lactic acidosis, holding liraglutide  Will need ongoing insulin as an outpatient  Sugars 171-169  Adding lantus 7 q am    Current daily smoker  With assess when more awake alert  Hyperlipidemia  Change pravachol 20--->80 MG high intensity dosing    Place on SCD-hold Lovenoex until seen Inpatient on stepdown   Consultants:   None  Procedures:   None  Antimicrobials:   Vancomycin 11/7-->  Zosyn 11/7   Subjective:  Overall doing fair no chest  pain No nausea States she has a mild headache She is coherent however and not guarding and has less inattention to the left side and has improved   Objective: Vitals:   12/27/16 0609 12/27/16 0617 12/27/16 0657 12/27/16 0721  BP:    121/69  Pulse: (!) 125 (!) 134 98 (!) 106  Resp: (!) 23 (!) 26 (!) 24 (!) 25  Temp:    99 F (37.2 C)  TempSrc:    Oral  SpO2: 99% 98% 99% 98%  Weight:      Height:        Intake/Output  Summary (Last 24 hours) at 12/27/2016 0731 Last data filed at 12/27/2016 0543 Gross per 24 hour  Intake 3521 ml  Output 1900 ml  Net 1621 ml   Filed Weights   12/26/16 1651 12/26/16 2127  Weight: 90.7 kg (200 lb) 96 kg (211 lb 10.3 oz)    Examination:  Awake alert, less inattention to the left side cta b, no added sound abd soft nt nd  R elbow has abrasion over I t and is tedner--ROM intact however she is having guarding to the right shoulder and is unable to raise above the painful arc s1 s2 irreg but rate controlled LUE has some contractures and minimally movement-has some spasm in addition to some debility Still has some  Bruising over R face  Data Reviewed: I have personally reviewed following labs and imaging studies  CBC: Recent Labs  Lab 12/26/16 1653 12/27/16 0232  WBC 13.8* 13.6*  NEUTROABS 11.7*  --   HGB 15.6* 14.7  HCT 47.8* 45.5  MCV 85.8 86.2  PLT 221 189   Basic Metabolic Panel: Recent Labs  Lab 12/26/16 1653 12/27/16 0232  NA 137 137  K 4.2 3.0*  CL 105 105  CO2 19* 22  GLUCOSE 329* 274*  BUN 12 10  CREATININE 0.94 0.83  CALCIUM 8.6* 8.1*   GFR: Estimated Creatinine Clearance: 67.6 mL/min (by C-G formula based on SCr of 0.83 mg/dL). Liver Function Tests: Recent Labs  Lab 12/26/16 1653 12/27/16 0232  AST 39 43*  ALT 15 17  ALKPHOS 97 81  BILITOT 0.8 0.9  PROT 6.7 5.9*  ALBUMIN 3.3* 2.9*   No results for input(s): LIPASE, AMYLASE in the last 168 hours. No results for input(s): AMMONIA in the last 168 hours. Coagulation Profile: Recent Labs  Lab 12/26/16 1653  INR 1.13   Cardiac Enzymes: Recent Labs  Lab 12/26/16 2156  CKTOTAL 2,293*   BNP (last 3 results) No results for input(s): PROBNP in the last 8760 hours. HbA1C: No results for input(s): HGBA1C in the last 72 hours. CBG: Recent Labs  Lab 12/26/16 1645 12/26/16 2128  GLUCAP 339* 343*   Lipid Profile: No results for input(s): CHOL, HDL, LDLCALC, TRIG, CHOLHDL,  LDLDIRECT in the last 72 hours. Thyroid Function Tests: No results for input(s): TSH, T4TOTAL, FREET4, T3FREE, THYROIDAB in the last 72 hours. Anemia Panel: No results for input(s): VITAMINB12, FOLATE, FERRITIN, TIBC, IRON, RETICCTPCT in the last 72 hours. Urine analysis:    Component Value Date/Time   COLORURINE AMBER (A) 12/26/2016 1653   APPEARANCEUR CLOUDY (A) 12/26/2016 1653   LABSPEC 1.027 12/26/2016 1653   PHURINE 5.0 12/26/2016 1653   GLUCOSEU >=500 (A) 12/26/2016 1653   HGBUR LARGE (A) 12/26/2016 1653   BILIRUBINUR NEGATIVE 12/26/2016 1653   KETONESUR 20 (A) 12/26/2016 1653   PROTEINUR >=300 (A) 12/26/2016 1653   UROBILINOGEN 0.2 11/29/2014 2049   NITRITE POSITIVE (A)  12/26/2016 1653   LEUKOCYTESUR NEGATIVE 12/26/2016 1653     Radiology Studies: Reviewed images personally in health database    Scheduled Meds: . albuterol  2.5 mg Nebulization Q6H  . diltiazem  30 mg Oral Q8H  . insulin aspart  0-15 Units Subcutaneous TID WC  . insulin aspart  0-5 Units Subcutaneous QHS  . insulin aspart  0-9 Units Subcutaneous TID WC  . ipratropium  0.5 mg Nebulization Q6H  . mouth rinse  15 mL Mouth Rinse BID  . metoprolol tartrate  5 mg Intravenous Once   Continuous Infusions: . piperacillin-tazobactam (ZOSYN)  IV 3.375 g (12/27/16 0543)  . vancomycin Stopped (12/27/16 0657)     LOS: 1 day    Time spent: 5135    Pleas KochJai Valeen Borys, MD [facility](P) [phone]   If 7PM-7AM, please contact night-coverage [email or web address] Password [email or web address] 12/27/2016, 7:31 AM

## 2017-01-01 ENCOUNTER — Inpatient Hospital Stay (HOSPITAL_COMMUNITY): Payer: Medicare Other

## 2017-01-01 DIAGNOSIS — I621 Nontraumatic extradural hemorrhage: Secondary | ICD-10-CM

## 2017-01-01 DIAGNOSIS — I609 Nontraumatic subarachnoid hemorrhage, unspecified: Secondary | ICD-10-CM

## 2017-01-01 DIAGNOSIS — B958 Unspecified staphylococcus as the cause of diseases classified elsewhere: Secondary | ICD-10-CM

## 2017-01-01 LAB — BASIC METABOLIC PANEL
Anion gap: 6 (ref 5–15)
BUN: 9 mg/dL (ref 6–20)
CALCIUM: 8.2 mg/dL — AB (ref 8.9–10.3)
CHLORIDE: 102 mmol/L (ref 101–111)
CO2: 25 mmol/L (ref 22–32)
CREATININE: 0.55 mg/dL (ref 0.44–1.00)
GFR calc non Af Amer: >60 mL/min (ref >60)
Glucose, Bld: 212 mg/dL — ABNORMAL HIGH (ref 65–99)
Potassium: 4.2 mmol/L (ref 3.5–5.1)
Sodium: 133 mmol/L — ABNORMAL LOW (ref 135–145)

## 2017-01-01 LAB — CBC WITH DIFFERENTIAL/PLATELET
BASOS PCT: 0 %
Basophils Absolute: 0 10*3/uL (ref 0.0–0.1)
Eosinophils Absolute: 0.2 10*3/uL (ref 0.0–0.7)
Eosinophils Relative: 3 %
HEMATOCRIT: 46.2 % — AB (ref 36.0–46.0)
HEMOGLOBIN: 15 g/dL (ref 12.0–15.0)
LYMPHS PCT: 17 %
Lymphs Abs: 1.5 10*3/uL (ref 0.7–4.0)
MCH: 28.3 pg (ref 26.0–34.0)
MCHC: 32.5 g/dL (ref 30.0–36.0)
MCV: 87.2 fL (ref 78.0–100.0)
MONO ABS: 0.6 10*3/uL (ref 0.1–1.0)
MONOS PCT: 7 %
NEUTROS ABS: 6.2 10*3/uL (ref 1.7–7.7)
NEUTROS PCT: 73 %
Platelets: 152 10*3/uL (ref 150–400)
RBC: 5.3 MIL/uL — ABNORMAL HIGH (ref 3.87–5.11)
RDW: 14.3 % (ref 11.5–15.5)
WBC: 8.4 10*3/uL (ref 4.0–10.5)

## 2017-01-01 LAB — GLUCOSE, CAPILLARY
Glucose-Capillary: 115 mg/dL — ABNORMAL HIGH (ref 65–99)
Glucose-Capillary: 218 mg/dL — ABNORMAL HIGH (ref 65–99)
Glucose-Capillary: 167 mg/dL — ABNORMAL HIGH (ref 65–99)
Glucose-Capillary: 115 mg/dL — ABNORMAL HIGH (ref 65–99)

## 2017-01-01 LAB — MAGNESIUM: MAGNESIUM: 1.8 mg/dL (ref 1.7–2.4)

## 2017-01-01 MED ORDER — POTASSIUM CHLORIDE 20 MEQ/15ML (10%) PO SOLN
40.0000 meq | Freq: Every day | ORAL | Status: DC
  Administered 2017-01-02: 40 meq via ORAL
  Filled 2017-01-01 (×2): qty 30

## 2017-01-01 NOTE — Progress Notes (Signed)
PROGRESS NOTE    Karma GanjaJudy C Lett  ZHY:[id number] DOB: [birth year] DOA: 12/26/2016 PCP: Merri BrunettePharr, Walter, MD   Specialists:  none   Brief Narrative:   81 year old female Diabetes mellitus type 2 Osteoarthritis Hyperlipidemia Prior cholecystitis status post ERCP 2007  Right total knee replacement  Recently treated in emergency room 08/31/2016 for urinary tract infection with Keflex  Recently treated in emergency room 08/31/2016 for urinary tract infection with Keflex  Admitted to hospital 12/26/2016 with altered mental status probable fall found last normal 5 PM of 11/6 face down, lactic acid was >4 and she was febrile --noted to have some slurred speech and right-sided facial droop and some posturing of the right arm UTI as well as subdural hematoma was found and she was started on antibiotics for altered mental status presumably because of both  Developed new onset atrial fibrillation with RVR over the course of the night on admission    Assessment & Plan:   Active Problems:   Sepsis (HCC)   Subdural hematoma as well as fall L MCA cva noted on Ct head 11/7  Neurosurgeon amd neurology input appreciated  Dys 1 diet/honey liquids + thickener as per SLP-mbs 11/7  SLP has seen, cleared for dys 1, honey thick diet-ordered Cognitive eval as well  Aspirin 81 be resumed 1 week post stroke in setting of subdural  Saline lock IV 11/11   R arm pain/supraspinatus tear R side found 11/12 Also chronic low back pain on tramadol at home which was held on admission xrays of shoulder/humerus are neg from 11/8--has pain not relieved by tylenol cont tramadol 50 q 6 prn 11/10-pain is moderately improved 11/10 exam reasonable ROM to R elbow--x-ray right elbow repeated no fracture Large R supraspinatus tear on MRI 11/12-d/w Mr Lin GivensJeffries PA ortho-- --will need OT for 6 weeks and possible referral if no improvmeents  Coagulase-negative bacteremia 3/4 bottles on admission lactic acidosis of 4 White  count on admission 13.8  IV vancomycin and Zosyn on admission--BC growing 3/4 of coag neg staphy though only is on vancomycin right now  Infectious disease input from 11/10 is appreciated NGTD cultures of 11/10 blood cultures to determine duration of antibiotics--might disconitue? Possible transient bacteremia from being on ground?  WBc improved 13--->10-->7-8  Atrial fibrillation with RVR, new onset as of 12/26/2016 Chads score >5  Echo ef 55-60%, slight inc PASP 34  On Cardizem Gtt--initially and then converted to 180 mg  anticoagulation on discharge would be Eliquis in 2 weeks after repeat CT head shows resolved bleeding  See discussion above regarding aspirin   Mild hypokalemia  K 3.0-->3.2--4.0 on Kdur 40 qd  Magnesium 1.8 still  Follow mag peridoically  Diarrhea Await further diarrhea stool--if none, then no need to test for Cdiff  Diabetes mellitus type 2, A1c this admit 8.7  On metformin at home-? cause lactic acidosis, holding liraglutide  Will need ongoing insulin as an outpatient  Sugars sugars 115--218  cont lantus 7 q am    Current daily smoker  With assess when more awake alert  Hyperlipidemia  Change pravachol 20--->80 MG high intensity dosing    Place on SCD-hold Lovenoex until seen Inpatient on stepdown   Consultants:   None  Procedures:   None  Antimicrobials:   Vancomycin 11/7-->  Zosyn 11/7   Subjective:  Sleepy--I visited 2x Had sedation for shoulder MRI  Objective: Vitals:   12/27/16 0609 12/27/16 0617 12/27/16 0657 12/27/16 0721  BP:    121/69  Pulse: (!) 125 Marland Kitchen(!)  134 98 (!) 106  Resp: (!) 23 (!) 26 (!) 24 (!) 25  Temp:    99 F (37.2 C)  TempSrc:    Oral  SpO2: 99% 98% 99% 98%  Weight:      Height:        Intake/Output Summary (Last 24 hours) at 12/27/2016 0731 Last data filed at 12/27/2016 0543 Gross per 24 hour  Intake 3521 ml  Output 1900 ml  Net 1621 ml   Filed Weights   12/26/16 1651 12/26/16 2127  Weight: 90.7  kg (200 lb) 96 kg (211 lb 10.3 oz)    Examination:  Sleepy Chest overtly clear Didn't disturb further  Data Reviewed: I have personally reviewed following labs and imaging studies  CBC: Recent Labs  Lab 12/26/16 1653 12/27/16 0232  WBC 13.8* 13.6*  NEUTROABS 11.7*  --   HGB 15.6* 14.7  HCT 47.8* 45.5  MCV 85.8 86.2  PLT 221 189   Basic Metabolic Panel: Recent Labs  Lab 12/26/16 1653 12/27/16 0232  NA 137 137  K 4.2 3.0*  CL 105 105  CO2 19* 22  GLUCOSE 329* 274*  BUN 12 10  CREATININE 0.94 0.83  CALCIUM 8.6* 8.1*   GFR: Estimated Creatinine Clearance: 67.6 mL/min (by C-G formula based on SCr of 0.83 mg/dL). Liver Function Tests: Recent Labs  Lab 12/26/16 1653 12/27/16 0232  AST 39 43*  ALT 15 17  ALKPHOS 97 81  BILITOT 0.8 0.9  PROT 6.7 5.9*  ALBUMIN 3.3* 2.9*   No results for input(s): LIPASE, AMYLASE in the last 168 hours. No results for input(s): AMMONIA in the last 168 hours. Coagulation Profile: Recent Labs  Lab 12/26/16 1653  INR 1.13   Cardiac Enzymes: Recent Labs  Lab 12/26/16 2156  CKTOTAL 2,293*   BNP (last 3 results) No results for input(s): PROBNP in the last 8760 hours. HbA1C: No results for input(s): HGBA1C in the last 72 hours. CBG: Recent Labs  Lab 12/26/16 1645 12/26/16 2128  GLUCAP 339* 343*   Lipid Profile: No results for input(s): CHOL, HDL, LDLCALC, TRIG, CHOLHDL, LDLDIRECT in the last 72 hours. Thyroid Function Tests: No results for input(s): TSH, T4TOTAL, FREET4, T3FREE, THYROIDAB in the last 72 hours. Anemia Panel: No results for input(s): VITAMINB12, FOLATE, FERRITIN, TIBC, IRON, RETICCTPCT in the last 72 hours. Urine analysis:    Component Value Date/Time   COLORURINE AMBER (A) 12/26/2016 1653   APPEARANCEUR CLOUDY (A) 12/26/2016 1653   LABSPEC 1.027 12/26/2016 1653   PHURINE 5.0 12/26/2016 1653   GLUCOSEU >=500 (A) 12/26/2016 1653   HGBUR LARGE (A) 12/26/2016 1653   BILIRUBINUR NEGATIVE 12/26/2016  1653   KETONESUR 20 (A) 12/26/2016 1653   PROTEINUR >=300 (A) 12/26/2016 1653   UROBILINOGEN 0.2 11/29/2014 2049   NITRITE POSITIVE (A) 12/26/2016 1653   LEUKOCYTESUR NEGATIVE 12/26/2016 1653     Radiology Studies: Reviewed images personally in health database    . diltiazem  180 mg Oral Daily  . heparin subcutaneous  5,000 Units Subcutaneous Q8H  . insulin aspart  0-5 Units Subcutaneous QHS  . insulin aspart  0-9 Units Subcutaneous TID WC  . insulin glargine  7 Units Subcutaneous Daily  . magnesium oxide  400 mg Oral BID  . mouth rinse  15 mL Mouth Rinse BID  . nicotine  21 mg Transdermal Daily  . ondansetron (ZOFRAN) IV  4 mg Intravenous Once  . [START ON 01/02/2017] potassium chloride  40 mEq Oral Daily  . potassium chloride  40 mEq Oral Daily  . pravastatin  80 mg Oral QHS    . vancomycin Stopped (01/01/17 1430)      LOS: 1 day    Time spent: 7535    Pleas KochJai Inigo Lantigua, MD [facility](P) [phone]   If 7PM-7AM, please contact night-coverage [email or web address] Password [email or web address] 12/27/2016, 7:31 AM

## 2017-01-01 NOTE — Progress Notes (Signed)
Physical Therapy Treatment Patient Details Name: Alison Doyle MRN: [id number] DOB: Sep 04, 1935 Today's Date: 01/01/2017    History of Present Illness 80yo female [facility] for sepsis, UTI, SDH and DM.  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    PT Comments    Patient seen for activity progression. Patient with significant lethargy this session. Attempted EOB and arousal techniques. NP present for conclusion of session. Suspect medication (received Ativan). Current POC updated to SNF for post acute rehabilitation.    Follow Up Recommendations  SNF     Equipment Recommendations  Other (comment)(TBA)    Recommendations for Other Services Rehab consult     Precautions / Restrictions Precautions Precautions: Fall Restrictions Weight Bearing Restrictions: No    Mobility  Bed Mobility Overal bed mobility: Needs Assistance Bed Mobility: Rolling;Supine to Sit;Sit to Supine Rolling: Max assist;+2 for physical assistance   Supine to sit: Max assist;HOB elevated Sit to supine: Max assist   General bed mobility comments: uitlaized mechanical features of bed to come to long upright position. Poor arousal, patient then repositioned in bed  Transfers Overall transfer level: Needs assistance                  Ambulation/Gait             General Gait Details: NT   Stairs            Wheelchair Mobility    Modified Rankin (Stroke Patients Only)       Balance Overall balance assessment: Needs assistance Sitting-balance support: Feet supported;Single extremity supported;No upper extremity supported   Sitting balance - Comments: unable to tolerate EOB due to lethargy                                    Cognition Arousal/Alertness: Lethargic Behavior During Therapy: Flat affect Overall Cognitive Status: Impaired/Different from baseline                                  General Comments: patient significantly lethargic today      Exercises      General Comments        Pertinent Vitals/Pain Pain Assessment: Faces Faces Pain Scale: No hurt    Home Living                      Prior Function            PT Goals (current goals can now be found in the care plan section) Acute Rehab PT Goals Patient Stated Goal: none stated PT Goal Formulation: Patient unable to participate in goal setting Time For Goal Achievement: 01/12/17 Potential to Achieve Goals: Good Progress towards PT goals: Not progressing toward goals - comment(limited by arousal today)    Frequency    Min 3X/week      PT Plan Discharge plan needs to be updated    Co-evaluation              AM-PAC PT "6 Clicks" Daily Activity  Outcome Measure  Difficulty turning over in bed (including adjusting bedclothes, sheets and blankets)?: Unable Difficulty moving from lying on back to sitting on the side of the bed? : Unable Difficulty sitting down on and standing up from a chair with arms (e.g., wheelchair, bedside  commode, etc,.)?: Unable Help needed moving to and from a bed to chair (including a wheelchair)?: Total Help needed walking in hospital room?: Total Help needed climbing 3-5 steps with a railing? : Total 6 Click Score: 6    End of Session   Activity Tolerance: Patient tolerated treatment well Patient left: in bed;with call bell/phone within reach;with bed alarm set;with family/visitor present Nurse Communication: Mobility status PT Visit Diagnosis: Hemiplegia and hemiparesis;Muscle weakness (generalized) (M62.81);Pain Hemiplegia - Right/Left: Left(both) Pain - Right/Left: (both) Pain - part of body: Shoulder(back)     Time: 1308-65781405-1420 PT Time Calculation (min) (ACUTE ONLY): 15 min  Charges:  $Therapeutic Activity: 8-22 mins                    G Codes:       Alison Doyle, PT DPT  Board Certified Neurologic  Specialist [phone]    Alison Doyle 01/01/2017, 3:06 PM

## 2017-01-01 NOTE — Progress Notes (Signed)
Occupational Therapy Treatment Patient Details Name: Alison Doyle MRN: [id number] DOB: 04/22/1935 Today's Date: 01/01/2017    History of present illness 81yo female [facility] for sepsis, UTI, SDH and DM.  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   OT comments  Pt demonstrates difficulty following commands today to participate - could be related to having received Ativan earlier, per nsg.  She required total A for bed mobility, and max A to sit EOB x 7 mins - heavy Lt lean, and unable to right it.  She reuqires total A for ADLs.    Follow Up Recommendations  SNF;Supervision/Assistance - 24 hour    Equipment Recommendations  None recommended by OT;Other (comment)    Recommendations for Other Services      Precautions / Restrictions Precautions Precautions: Fall Restrictions Weight Bearing Restrictions: No       Mobility Bed Mobility Overal bed mobility: Needs Assistance Bed Mobility: Supine to Sit;Sit to Supine Rolling: Max assist;+2 for physical assistance   Supine to sit: Total assist Sit to supine: Total assist   General bed mobility comments: Pt only able to assist slightly.  She will begin to initiate movement, then self distract   Transfers Overall transfer level: Needs assistance               General transfer comment: unable to safely attempt     Balance Overall balance assessment: Needs assistance Sitting-balance support: Feet supported;Single extremity supported;No upper extremity supported Sitting balance-Leahy Scale: Poor Sitting balance - Comments: Pt requires max A with EOB sitting.  Pt leans heavily to the Lt, requires max verbal and tactile cues for pt to initiate righting posture, however, she self distracts and unable to follow through with her attempts                                    ADL either performed or assessed with clinical judgement   ADL  Overall ADL's : Needs assistance/impaired     Grooming: Wash/dry hands;Wash/dry face;Oral care;Total assistance;Sitting Grooming Details (indicate cue type and reason): hand over hand assist, but pt unable to sustain attention to task                                      Vision   Additional Comments: L   Perception     Praxis      Cognition Arousal/Alertness: Awake/alert Behavior During Therapy: Flat affect Overall Cognitive Status: Impaired/Different from baseline Area of Impairment: Orientation;Attention;Following commands;Problem solving;Awareness                 Orientation Level: Disoriented to;Time Current Attention Level: Focused   Following Commands: Follows one step commands inconsistently Safety/Judgement: Decreased awareness of safety;Decreased awareness of deficits Awareness: Intellectual Problem Solving: Slow processing;Decreased initiation;Difficulty sequencing;Requires verbal cues;Requires tactile cues General Comments: patient significantly lethargic today        Exercises Exercises: General Upper Extremity General Exercises - Upper Extremity Shoulder Flexion: PROM;Left;5 reps;Supine Elbow Flexion: PROM;Left;5 reps;Supine Elbow Extension: PROM;Left;5 reps;Supine Wrist Flexion: PROM;Left;5 reps;Seated Wrist Extension: PROM;Left;5 reps;Supine Digit Composite Flexion: PROM;Left;5 reps;Supine Composite Extension: PROM;Left;5 reps;Supine   Shoulder Instructions       General Comments      Pertinent Vitals/ Pain       Pain Assessment: Faces Faces  Pain Scale: Hurts even more Pain Location: generalized pain "all over"  Pain Descriptors / Indicators: Moaning Pain Intervention(s): Monitored during session;Repositioned  Home Living                                          Prior Functioning/Environment              Frequency  Min 2X/week        Progress Toward Goals  OT Goals(current goals can now be  found in the care plan section)  Progress towards OT goals: Not progressing toward goals - comment(impaired cognition - pt received Ativan earlier )  Acute Rehab OT Goals Patient Stated Goal: none stated  Plan Discharge plan remains appropriate    Co-evaluation                 AM-PAC PT "6 Clicks" Daily Activity     Outcome Measure   Help from another person eating meals?: Total Help from another person taking care of personal grooming?: A Lot Help from another person toileting, which includes using toliet, bedpan, or urinal?: Total Help from another person bathing (including washing, rinsing, drying)?: Total Help from another person to put on and taking off regular upper body clothing?: Total Help from another person to put on and taking off regular lower body clothing?: Total 6 Click Score: 7    End of Session    OT Visit Diagnosis: Muscle weakness (generalized) (M62.81);Other symptoms and signs involving cognitive function;History of falling (Z91.81);Pain;Hemiplegia and hemiparesis Hemiplegia - Right/Left: Left Hemiplegia - dominant/non-dominant: Non-Dominant Hemiplegia - caused by: Unspecified Pain - Right/Left: Left Pain - part of body: Arm   Activity Tolerance Patient limited by fatigue;Other (comment)(as well as cognition )   Patient Left in bed;with call bell/phone within reach   Nurse Communication          Time: [id number] OT Time Calculation (min): 18 min  Charges: OT General Charges $OT Visit: 1 Visit OT Treatments $Neuromuscular Re-education: 8-22 mins  Reynolds AmericanWendi Zohar Maroney, OTR/L [phone]    Jeani HawkingConarpe, Woodrow Dulski M 01/01/2017, 6:25 PM

## 2017-01-01 NOTE — Progress Notes (Signed)
SLP Cancellation Note  Patient Details Name: Alison Doyle MRN: [id number] DOB: 1935-07-03   Cancelled treatment:       Reason Eval/Treat Not Completed: Patient at procedure or test/unavailable. Leaving for MRI. Will f/u.   Alison LangoLeah Fredrick Dray MA, CCC-SLP [phone]    Alison Doyle 01/01/2017, 9:42 AM

## 2017-01-01 NOTE — Progress Notes (Signed)
[facility]  Date of Admission:  12/26/2016     Total days of antibiotics 6 Vancomycin Day 6          ASSESSMENT/PLAN 81 year old female with PMH of diabetes with actue right middle cerebral artery CVA likely related to her atrial fibrillation and coag-negative staph bacteremia noted on blood cultures on 11/6. Currently treated with vancomycin with repeat blood culture positive for staph (1 of 2). Blood cultures from 11/10 are currently no growth.   Coag-Negative Staph Bacteremia - Afebrile with no leukocytosis. Continue current dosage of vancomycin. If cultures remain positive will consider TEE. Treatment length pending blood culture results.   Right MCA CVA -  She is lethargic upon physical examination likely related to lorazepam received during her MRI for her shoulder. Risk factor reduction and further stroke work up per primary.   Atrial Fibrillation - Appears rate controlled. Anticoagulation per primary in the setting of resoled bleeding.    Principal Problem:   Cerebrovascular accident (CVA) due to embolism of right middle cerebral artery (HCC) Active Problems:   Sepsis (HCC)   Subarachnoid hemorrhage   Acute pain of right shoulder   Shoulder pain, left   Epidural hematoma (HCC)   Paroxysmal atrial fibrillation (HCC)   Diabetes mellitus (HCC)   Coagulase negative Staphylococcus bacteremia   . diltiazem  180 mg Oral Daily  . heparin subcutaneous  5,000 Units Subcutaneous Q8H  . insulin aspart  0-5 Units Subcutaneous QHS  . insulin aspart  0-9 Units Subcutaneous TID WC  . insulin glargine  7 Units Subcutaneous Daily  . magnesium oxide  400 mg Oral BID  . mouth rinse  15 mL Mouth Rinse BID  . nicotine  21 mg Transdermal Daily  . ondansetron (ZOFRAN) IV  4 mg Intravenous Once  . pneumococcal 23 valent vaccine  0.5 mL Intramuscular Tomorrow-1000  . potassium chloride  40 mEq Oral Daily  . pravastatin  80 mg Oral QHS     SUBJECTIVE: Afebrile and very lethargic. Grunting to painful stimuli.   No Known Allergies   Review of Systems: Review of Systems  Unable to perform ROS: Acuity of condition    OBJECTIVE: Vitals:   12/31/16 0538 12/31/16 2116 01/01/17 0440 01/01/17 0826  BP: 133/79 (!) 129/106 (!) 118/99 121/84  Pulse: 95 (!) 120 (!) 105 79  Resp: 19 18  20   Temp: 99.1 F (37.3 C) 98.5 F (36.9 C) 98.6 F (37 C) 97.6 F (36.4 C)  TempSrc: Axillary Oral Oral Oral  SpO2: 98% 99% 99% 99%  Weight:      Height:       Body mass index is 31.63 kg/m.  Physical Exam  Constitutional: She is well-developed, well-nourished, and in no distress. She appears lethargic. No distress.  Cardiovascular: Normal rate, normal heart sounds and intact distal pulses. An irregularly irregular rhythm present. Exam reveals no gallop and no friction rub.  No murmur heard. Pulmonary/Chest: Effort normal and breath sounds normal. No respiratory distress. She has no wheezes. She has no rales. She exhibits no tenderness.  Neurological: She appears lethargic.    Lab Results Lab Results  Component Value Date   WBC 8.4 01/01/2017   HGB 15.0 01/01/2017   HCT 46.2 (H) 01/01/2017   MCV 87.2 01/01/2017   PLT 152 01/01/2017    Lab Results  Component Value Date   CREATININE 0.55 01/01/2017   BUN 9 01/01/2017   NA 133 (L) 01/01/2017   K  4.2 01/01/2017   CL 102 01/01/2017   CO2 25 01/01/2017    Lab Results  Component Value Date   ALT 17 12/27/2016   AST 43 (H) 12/27/2016   ALKPHOS 81 12/27/2016   BILITOT 0.9 12/27/2016     Microbiology: Recent Results (from the past 240 hour(s))  Culture, blood (Routine x 2)     Status: Abnormal   Collection Time: 12/26/16  5:19 PM  Result Value Ref Range Status   Specimen Description BLOOD BLOOD LEFT FOREARM  Final   Special Requests IN PEDIATRIC BOTTLE Blood Culture adequate volume  Final   Culture  Setup Time   Final    GRAM POSITIVE COCCI IN CLUSTERS IN  PEDIATRIC BOTTLE CRITICAL VALUE NOTED.  VALUE IS CONSISTENT WITH PREVIOUSLY REPORTED AND CALLED VALUE.    Culture (A)  Final    STAPHYLOCOCCUS SPECIES (COAGULASE NEGATIVE) SUSCEPTIBILITIES PERFORMED ON PREVIOUS CULTURE WITHIN THE LAST 5 DAYS.    Report Status 12/29/2016 FINAL  Final  Culture, blood (Routine x 2)     Status: Abnormal   Collection Time: 12/26/16  5:28 PM  Result Value Ref Range Status   Specimen Description BLOOD LEFT WRIST  Final   Special Requests   Final    BOTTLES DRAWN AEROBIC ONLY Blood Culture adequate volume   Culture  Setup Time   Final    GRAM POSITIVE COCCI IN CLUSTERS AEROBIC BOTTLE ONLY CRITICAL RESULT CALLED TO, READ BACK BY AND VERIFIED WITH: ADagoberto Ligas. Johnston Pharm.D. 16:55 12/27/16 (wilsonm)    Culture STAPHYLOCOCCUS SPECIES (COAGULASE NEGATIVE) (A)  Final   Report Status 12/29/2016 FINAL  Final   Organism ID, Bacteria STAPHYLOCOCCUS SPECIES (COAGULASE NEGATIVE)  Final      Susceptibility   Staphylococcus species (coagulase negative) - MIC*    CIPROFLOXACIN <=0.5 SENSITIVE Sensitive     ERYTHROMYCIN >=8 RESISTANT Resistant     GENTAMICIN <=0.5 SENSITIVE Sensitive     OXACILLIN RESISTANT Resistant     TETRACYCLINE >=16 RESISTANT Resistant     VANCOMYCIN <=0.5 SENSITIVE Sensitive     TRIMETH/SULFA 80 RESISTANT Resistant     CLINDAMYCIN <=0.25 SENSITIVE Sensitive     RIFAMPIN <=0.5 SENSITIVE Sensitive     Inducible Clindamycin NEGATIVE Sensitive     * STAPHYLOCOCCUS SPECIES (COAGULASE NEGATIVE)  Blood Culture ID Panel (Reflexed)     Status: Abnormal   Collection Time: 12/26/16  5:28 PM  Result Value Ref Range Status   Enterococcus species NOT DETECTED NOT DETECTED Final   Listeria monocytogenes NOT DETECTED NOT DETECTED Final   Staphylococcus species DETECTED (A) NOT DETECTED Final    Comment: Methicillin (oxacillin) resistant coagulase negative staphylococcus. Possible blood culture contaminant (unless isolated from more than one blood culture draw or  clinical case suggests pathogenicity). No antibiotic treatment is indicated for blood  culture contaminants. CRITICAL RESULT CALLED TO, READ BACK BY AND VERIFIED WITH: ADagoberto Ligas. Johnston Pharm.D. 16:55 12/27/16 (wilsonm)    Staphylococcus aureus NOT DETECTED NOT DETECTED Final   Methicillin resistance DETECTED (A) NOT DETECTED Final    Comment: CRITICAL RESULT CALLED TO, READ BACK BY AND VERIFIED WITH: ADagoberto Ligas. Johnston Pharm.D. 16:55 12/27/16 (wilsonm)    Streptococcus species NOT DETECTED NOT DETECTED Final   Streptococcus agalactiae NOT DETECTED NOT DETECTED Final   Streptococcus pneumoniae NOT DETECTED NOT DETECTED Final   Streptococcus pyogenes NOT DETECTED NOT DETECTED Final   Acinetobacter baumannii NOT DETECTED NOT DETECTED Final   Enterobacteriaceae species NOT DETECTED NOT DETECTED Final   Enterobacter cloacae complex NOT DETECTED NOT  DETECTED Final   Escherichia coli NOT DETECTED NOT DETECTED Final   Klebsiella oxytoca NOT DETECTED NOT DETECTED Final   Klebsiella pneumoniae NOT DETECTED NOT DETECTED Final   Proteus species NOT DETECTED NOT DETECTED Final   Serratia marcescens NOT DETECTED NOT DETECTED Final   Haemophilus influenzae NOT DETECTED NOT DETECTED Final   Neisseria meningitidis NOT DETECTED NOT DETECTED Final   Pseudomonas aeruginosa NOT DETECTED NOT DETECTED Final   Candida albicans NOT DETECTED NOT DETECTED Final   Candida glabrata NOT DETECTED NOT DETECTED Final   Candida krusei NOT DETECTED NOT DETECTED Final   Candida parapsilosis NOT DETECTED NOT DETECTED Final   Candida tropicalis NOT DETECTED NOT DETECTED Final  MRSA PCR Screening     Status: None   Collection Time: 12/26/16  9:13 PM  Result Value Ref Range Status   MRSA by PCR NEGATIVE NEGATIVE Final    Comment:        The GeneXpert MRSA Assay (FDA approved for NASAL specimens only), is one component of a comprehensive MRSA colonization surveillance program. It is not intended to diagnose MRSA infection nor to  guide or monitor treatment for MRSA infections.   Culture, blood (routine x 2)     Status: Abnormal   Collection Time: 12/28/16  1:30 PM  Result Value Ref Range Status   Specimen Description BLOOD LEFT ANTECUBITAL  Final   Special Requests   Final    BOTTLES DRAWN AEROBIC AND ANAEROBIC Blood Culture adequate volume   Culture  Setup Time   Final    GRAM POSITIVE COCCI IN CLUSTERS ANAEROBIC BOTTLE ONLY CRITICAL RESULT CALLED TO, READ BACK BY AND VERIFIED WITH: A JOHNSTON,PHARMD AT 1641 12/29/16 BY L BENFIELD    Culture (A)  Final    STAPHYLOCOCCUS SPECIES (COAGULASE NEGATIVE) SUSCEPTIBILITIES PERFORMED ON PREVIOUS CULTURE WITHIN THE LAST 5 DAYS.    Report Status 12/31/2016 FINAL  Final  Culture, blood (routine x 2)     Status: None (Preliminary result)   Collection Time: 12/28/16  1:45 PM  Result Value Ref Range Status   Specimen Description BLOOD LEFT HAND  Final   Special Requests   Final    BOTTLES DRAWN AEROBIC AND ANAEROBIC Blood Culture adequate volume   Culture NO GROWTH 3 DAYS  Final   Report Status PENDING  Incomplete  Culture, blood (routine x 2)     Status: None (Preliminary result)   Collection Time: 12/30/16  1:10 PM  Result Value Ref Range Status   Specimen Description BLOOD RIGHT HAND  Final   Special Requests IN PEDIATRIC BOTTLE Blood Culture adequate volume  Final   Culture NO GROWTH < 24 HOURS  Final   Report Status PENDING  Incomplete  Culture, blood (routine x 2)     Status: None (Preliminary result)   Collection Time: 12/30/16  1:15 PM  Result Value Ref Range Status   Specimen Description BLOOD LEFT ARM  Final   Special Requests IN PEDIATRIC BOTTLE Blood Culture adequate volume  Final   Culture NO GROWTH < 24 HOURS  Final   Report Status PENDING  Incomplete     Marcos Eke, NP [facility] [phone] Pager  01/01/2017  11:27 AM

## 2017-01-01 NOTE — Progress Notes (Signed)
Pt just returned from MRI and asleep. I contacted her son, Apolinar JunesBrandon, by phone. He states pt lives alone and does not have 24/7 assistance at home. He is asking for SNF rehab and to be connected with SW. I will alert RN CM and SW of need for SNF at this time. We will sign off as pt does not have caregiver support at home needed after an inpt rehab admission. [phone]

## 2017-01-02 LAB — BASIC METABOLIC PANEL
Anion gap: 6 (ref 5–15)
BUN: 8 mg/dL (ref 6–20)
CHLORIDE: 101 mmol/L (ref 101–111)
CO2: 26 mmol/L (ref 22–32)
Calcium: 8.3 mg/dL — ABNORMAL LOW (ref 8.9–10.3)
Creatinine, Ser: 0.58 mg/dL (ref 0.44–1.00)
GFR calc Af Amer: >60 mL/min (ref >60)
GFR calc non Af Amer: >60 mL/min (ref >60)
GLUCOSE: 144 mg/dL — AB (ref 65–99)
POTASSIUM: 4.3 mmol/L (ref 3.5–5.1)
Sodium: 133 mmol/L — ABNORMAL LOW (ref 135–145)

## 2017-01-02 LAB — GLUCOSE, CAPILLARY
GLUCOSE-CAPILLARY: 153 mg/dL — AB (ref 65–99)
GLUCOSE-CAPILLARY: 177 mg/dL — AB (ref 65–99)
Glucose-Capillary: 152 mg/dL — ABNORMAL HIGH (ref 65–99)
Glucose-Capillary: 176 mg/dL — ABNORMAL HIGH (ref 65–99)

## 2017-01-02 LAB — CULTURE, BLOOD (ROUTINE X 2)
Culture: NO GROWTH
SPECIAL REQUESTS: ADEQUATE

## 2017-01-02 LAB — VANCOMYCIN, TROUGH: Vancomycin Tr: 17 ug/mL (ref 15–20)

## 2017-01-02 MED ORDER — ASPIRIN 81 MG PO CHEW
81.0000 mg | CHEWABLE_TABLET | Freq: Every day | ORAL | Status: DC
  Administered 2017-01-02 – 2017-01-04 (×3): 81 mg via ORAL
  Filled 2017-01-02 (×3): qty 1

## 2017-01-02 NOTE — Progress Notes (Signed)
  Speech Language Pathology Treatment: Dysphagia  Patient Details Name: Alison Doyle MRN: [id number] DOB: February 08, 1936 Today's Date: 01/02/2017 Time: [id number] SLP Time Calculation (min) (ACUTE ONLY): 8 min  Assessment / Plan / Recommendation Clinical Impression  Pt was seen for skilled ST targeting goals for dysphagia.  Pt was partially reclined in bed upon arrival.  SLP repositioned pt to maximize safe PO intake.  Pt perseverative on wanting to go across the hallway and get into her bed.  Pt reassured and easily redirectable to task.  Therapist facilitated the session with trials of honey thick liquids via cup sips and dys 2 solids.  Pt demonstrated delayed coughing following cup sips of thickened liquids but was able to effectively masticate and clear advanced solids from the oral cavity with min-mod verbal cues for attention to bolus.  Pt was left in bed with bed alarm set and call bell within reach.  Recommend advancing pt to dys 2 textures with ongoing need for honey thick liquids via teaspoon and full supervision for use of swallowing precautions.  Discussed with RN.  Continue per current plan of care.    HPI HPI: [name] y.o.female,with past medical history significant for diabetes mellitus was brought for evaluation for altered mental status, fever and suspected fall. Found to have UTI and CT of the head showedevolving acute to subacute posterior right MCA infarct involving the posterior right insula, right temporal and parietal lobes, stable right subdural hematoma since yesterday. Trace associated right temporal lobe subarachnoid hemorrhage. CXR enlargement of cardiac silhouette with question mild diffuse pulmonary edema.       SLP Plan  Continue with current plan of care       Recommendations  Diet recommendations: Dysphagia 2 (fine chop);Honey-thick liquid Liquids provided via: Teaspoon Medication Administration: Crushed with puree Supervision: Staff to assist with  self feeding;Full supervision/cueing for compensatory strategies Compensations: Slow rate;Small sips/bites;Hard cough after swallow Postural Changes and/or Swallow Maneuvers: Seated upright 90 degrees                Oral Care Recommendations: Oral care BID Follow up Recommendations: Inpatient Rehab SLP Visit Diagnosis: Dysphagia, oropharyngeal phase (R13.12) Attention and concentration deficit following: Cerebral infarction Plan: Continue with current plan of care       GO                PageMelanee Spry, [name] 01/02/2017, 2:46 PM

## 2017-01-02 NOTE — Progress Notes (Signed)
Pharmacy Antibiotic Note  Alison Doyle is a 81 y.o. female admitted on 12/26/2016 with sepsis.  Pharmacy consulted for vancomycin dosing.   With multiple Bcx growing CoNS. ID is following- notes that there is no evidence of endocarditis by echo. If repeat BCx become positive, may need TEE.  Vancomycin trough this morning at goal at 6117mcg/mL. Renal function stable.  Plan: Continue Vancomycin1000 mg IV q12h Monitor culture results, clinical picture, renal function, LOT per ID   Height: 5\' 8"  (172.7 cm) Weight: 242 lb (109.8 kg) IBW/kg (Calculated) : 63.9  Temp (24hrs), Avg:98.2 F (36.8 C), Min:97.8 F (36.6 C), Max:98.5 F (36.9 C)  Recent Labs  Lab 12/26/16 1653 12/26/16 1708 12/26/16 2053 12/27/16 0232 12/28/16 0233 12/29/16 0854 12/30/16 0239 12/30/16 1006 12/31/16 0236 01/01/17 0920 01/02/17 0328 01/02/17 0924  WBC 13.8*  --   --  13.6* 10.2 7.0  --   --   --  8.4  --   --   CREATININE 0.94  --   --  0.83 0.74 0.63 0.65  --  0.62 0.55 0.58  --   LATICACIDVEN  --  4.25* 1.63  --   --   --   --   --   --   --   --   --   VANCOTROUGH  --   --   --   --   --   --   --  14*  --   --   --  17    Estimated Creatinine Clearance: 75.3 mL/min (by C-G formula based on SCr of 0.58 mg/dL).    No Known Allergies  Antimicrobials this admission: Zosyn 11/6 >> 11/7 Vancomycin 11/6 >>   Dose adjustments this admission: 11/10 VT = 14 mcg/ml on 750mg  IV q12h > Increase to 1000 mg IV q12h 11/13 VT = 3317mcg/mL on 1g q12h > continue  Microbiology results: 11/6 MRSA PCR: negative 11/6 BCx: 2/2 CoNS (MRSE on BCID) 11/8 BCx: 1/2 CoNS 11/10 BCx: ngtd   Alison Doyle D. Alison Doyle, PharmD, BCPS Clinical Pharmacist Clinical Phone for 01/02/2017 until 3:30pm: [phone] If after 3:30pm, please call main pharmacy at [phone] 01/02/2017 11:20 AM

## 2017-01-02 NOTE — Progress Notes (Signed)
PROGRESS NOTE    Alison Doyle  ZOX:[id number] DOB: August 15, 1935 DOA: 12/26/2016 PCP: [name], MD   Specialists:  none   Brief Narrative:   81 year old female Diabetes mellitus type 2 Osteoarthritis Hyperlipidemia Prior cholecystitis status post ERCP 2007  Right total knee replacement  Recently treated in emergency room 08/31/2016 for urinary tract infection with Keflex  Recently treated in emergency room 08/31/2016 for urinary tract infection with Keflex  Admitted to hospital 12/26/2016 with altered mental status probable fall found last normal 5 PM of 11/6 face down, lactic acid was >4 and she was febrile --noted to have some slurred speech and right-sided facial droop and some posturing of the right arm UTI as well as subdural hematoma was found and she was started on antibiotics for altered mental status presumably because of both  Developed new onset atrial fibrillation with RVR over the course of the night on admission    Assessment & Plan:   Active Problems:   Sepsis (HCC)   Subdural hematoma as well as fall L MCA cva noted on Ct head 11/7  Neurosurgeon amd neurology input appreciated  Dys 1 diet/honey liquids + thickener as per SLP-mbs 11/7 honey thick diet  Aspirin 81 be resumed 1 week post stroke in setting of subdural--started 11/13 Needs to start Xarelto 11/20      R arm pain/supraspinatus tear R side found 11/12 Also chronic low back pain on tramadol at home which was held on admission xrays of shoulder/humerus are neg from 11/8--has pain not relieved by tylenol cont tramadol 50 q 6 prn 11/10-pain is moderately improved 11/10 exam reasonable ROM to R elbow--x-ray right elbow repeated no fracture  R supraspinatus tear on MRI 11/12-d/w Mr Lin Givens PA ortho-- --will need OT for 6 weeks and possible referral to ortho as OP if no improvmeents  Coagulase-negative bacteremia 3/4 bottles on admission lactic acidosis of 4 White count on admission 13.8 IV  vancomycin and Zosyn on admission--BC growing 3/4 of coag neg staphy though only is on vancomycin right now Infectious disease input from 11/10 is appreciated NGTD cultures of 11/10 blood cultures to determine duration of antibiotics  Possible d/c Vanc in 1-2 days and then work on SNF level care  WBc improved 13--->10-->7-8  Atrial fibrillation with RVR, new onset as of 12/26/2016 Chads score >5  Echo ef 55-60%, slight inc PASP 34  On Cardizem Gtt--initially and then converted to 180 mg  Mild hypokalemia  K 3.0-->3.2--4.0 on Kdur 40 qd  Magnesium 1.8 still  Follow mag in am 11.14  Diarrhea no need to test for Cdiff  Diabetes mellitus type 2, A1c this admit 8.7  On metformin at home-? cause lactic acidosis, holding liraglutide Will need ongoing insulin as an outpatient--will need SSI on d/c to snf  Sugars sugars 115--176  cont lantus 7 q am    Current daily smoker  With assess when more awake alert  Hyperlipidemia  Change pravachol 20--->80 MG high intensity dosing    Place on SCD-hold Lovenoex until seen Inpatient on stepdown   Consultants:   None  Procedures:   None  Antimicrobials:   Vancomycin 11/7-->  Zosyn 11/7   Subjective:  Well Alert still some cognitive defict Asking to be "put to bed" No cp n/v No sob Eating some  Objective: Vitals:   12/27/16 0609 12/27/16 0617 12/27/16 0657 12/27/16 0721  BP:    121/69  Pulse: (!) 125 (!) 134 98 (!) 106  Resp: (!) 23 (!) 26 Marland Kitchen)  24 (!) 25  Temp:    99 F (37.2 C)  TempSrc:    Oral  SpO2: 99% 98% 99% 98%  Weight:      Height:        Intake/Output Summary (Last 24 hours) at 12/27/2016 0731 Last data filed at 12/27/2016 0543 Gross per 24 hour  Intake 3521 ml  Output 1900 ml  Net 1621 ml   Filed Weights   12/26/16 1651 12/26/16 2127  Weight: 90.7 kg (200 lb) 96 kg (211 lb 10.3 oz)    Examination:  Inconsistent in terms of responses Chest overtly clear R upper ext in sling abd soft neuro  grossly intact and improved L side neglect slight better  Data Reviewed: I have personally reviewed following labs and imaging studies  CBC: Recent Labs  Lab 12/26/16 1653 12/27/16 0232  WBC 13.8* 13.6*  NEUTROABS 11.7*  --   HGB 15.6* 14.7  HCT 47.8* 45.5  MCV 85.8 86.2  PLT 221 189   Basic Metabolic Panel: Recent Labs  Lab 12/26/16 1653 12/27/16 0232  NA 137 137  K 4.2 3.0*  CL 105 105  CO2 19* 22  GLUCOSE 329* 274*  BUN 12 10  CREATININE 0.94 0.83  CALCIUM 8.6* 8.1*   GFR: Estimated Creatinine Clearance: 67.6 mL/min (by C-G formula based on SCr of 0.83 mg/dL). Liver Function Tests: Recent Labs  Lab 12/26/16 1653 12/27/16 0232  AST 39 43*  ALT 15 17  ALKPHOS 97 81  BILITOT 0.8 0.9  PROT 6.7 5.9*  ALBUMIN 3.3* 2.9*   No results for input(s): LIPASE, AMYLASE in the last 168 hours. No results for input(s): AMMONIA in the last 168 hours. Coagulation Profile: Recent Labs  Lab 12/26/16 1653  INR 1.13   Cardiac Enzymes: Recent Labs  Lab 12/26/16 2156  CKTOTAL 2,293*   BNP (last 3 results) No results for input(s): PROBNP in the last 8760 hours. HbA1C: No results for input(s): HGBA1C in the last 72 hours. CBG: Recent Labs  Lab 12/26/16 1645 12/26/16 2128  GLUCAP 339* 343*   Lipid Profile: No results for input(s): CHOL, HDL, LDLCALC, TRIG, CHOLHDL, LDLDIRECT in the last 72 hours. Thyroid Function Tests: No results for input(s): TSH, T4TOTAL, FREET4, T3FREE, THYROIDAB in the last 72 hours. Anemia Panel: No results for input(s): VITAMINB12, FOLATE, FERRITIN, TIBC, IRON, RETICCTPCT in the last 72 hours. Urine analysis:    Component Value Date/Time   COLORURINE AMBER (A) 12/26/2016 1653   APPEARANCEUR CLOUDY (A) 12/26/2016 1653   LABSPEC 1.027 12/26/2016 1653   PHURINE 5.0 12/26/2016 1653   GLUCOSEU >=500 (A) 12/26/2016 1653   HGBUR LARGE (A) 12/26/2016 1653   BILIRUBINUR NEGATIVE 12/26/2016 1653   KETONESUR 20 (A) 12/26/2016 1653   PROTEINUR  >=300 (A) 12/26/2016 1653   UROBILINOGEN 0.2 11/29/2014 2049   NITRITE POSITIVE (A) 12/26/2016 1653   LEUKOCYTESUR NEGATIVE 12/26/2016 1653     Radiology Studies: Reviewed images personally in health database    . diltiazem  180 mg Oral Daily  . heparin subcutaneous  5,000 Units Subcutaneous Q8H  . insulin aspart  0-5 Units Subcutaneous QHS  . insulin aspart  0-9 Units Subcutaneous TID WC  . insulin glargine  7 Units Subcutaneous Daily  . magnesium oxide  400 mg Oral BID  . mouth rinse  15 mL Mouth Rinse BID  . nicotine  21 mg Transdermal Daily  . ondansetron (ZOFRAN) IV  4 mg Intravenous Once  . potassium chloride  40 mEq Oral Daily  .  pravastatin  80 mg Oral QHS    . vancomycin Stopped (01/02/17 1157)      LOS: 1 day    Time spent: 2535    Pleas KochJai Kosta Schnitzler, MD [facility](P) [phone]   If 7PM-7AM, please contact night-coverage [email or web address] Password [email or web address] 12/27/2016, 7:31 AM

## 2017-01-02 NOTE — Care Management Important Message (Signed)
Important Message  Patient Details  Name: Alison Doyle MRN: [id number] Date of Birth: 1935-09-08   Medicare Important Message Given:  Yes    [name] 01/02/2017, 10:11 AM

## 2017-01-02 NOTE — Progress Notes (Signed)
[facility]  Date of Admission:  12/26/2016     Total days of antibiotics 7 Vancomycin 11/6 >>          ASSESSMENT/PLAN  Coag-Negative Staph Bacteremia - Has remained afebrile with no leukocytosis. Repeat blood cultures from 11/10 with no growth to date. No indication for TEE. Continue current vancomycin. Consider lower trough level goal of 10-15. Review length of treatment pending culture results.   Right MCA CVA - More alert and interactive today. Continue care per primary.  Therapeutic drug monitoring - Renal function stable with current dosage of vancomycin. Continue to monitor.   Back/Shoulder pain - Likely related to trauma from the fall and unlikely infectious source. MRI with rotator cuff tear. Additional treatment per primary.   Principal Problem:   Cerebrovascular accident (CVA) due to embolism of right middle cerebral artery (HCC) Active Problems:   Sepsis (HCC)   Subarachnoid hemorrhage   Acute pain of right shoulder   Shoulder pain, left   Epidural hematoma (HCC)   Paroxysmal atrial fibrillation (HCC)   Diabetes mellitus (HCC)   Coagulase negative Staphylococcus bacteremia   . diltiazem  180 mg Oral Daily  . heparin subcutaneous  5,000 Units Subcutaneous Q8H  . insulin aspart  0-5 Units Subcutaneous QHS  . insulin aspart  0-9 Units Subcutaneous TID WC  . insulin glargine  7 Units Subcutaneous Daily  . magnesium oxide  400 mg Oral BID  . mouth rinse  15 mL Mouth Rinse BID  . nicotine  21 mg Transdermal Daily  . ondansetron (ZOFRAN) IV  4 mg Intravenous Once  . potassium chloride  40 mEq Oral Daily  . pravastatin  80 mg Oral QHS    SUBJECTIVE: Feeling okay. Afebrile overnight. Some upper back discomfort from where she fell.   No Known Allergies   Review of Systems: Review of Systems  Constitutional: Negative for chills, fever and weight loss.  Respiratory: Negative for cough, hemoptysis, sputum production, shortness of  breath and wheezing.   Cardiovascular: Negative for chest pain.  Gastrointestinal: Negative for abdominal pain, blood in stool, constipation, diarrhea, nausea and vomiting.  Musculoskeletal: Positive for back pain.  Neurological: Negative for weakness.      OBJECTIVE: Vitals:   01/01/17 1800 01/01/17 2017 01/02/17 0524 01/02/17 0852  BP: 123/85 119/85 125/85 106/77  Pulse: 77 69 90 99  Resp: 20 16 18 20   Temp: 97.8 F (36.6 C) 98.3 F (36.8 C) 98 F (36.7 C) 98.5 F (36.9 C)  TempSrc: Oral   Oral  SpO2: 98% 99% 99% 98%  Weight:  242 lb (109.8 kg)    Height:       Body mass index is 36.8 kg/m.  Physical Exam  Constitutional: She is well-developed, well-nourished, and in no distress. No distress.  Lying in bed with sling in place. Ecchymosis appears on the right side with some change to green/yellow.   Cardiovascular: Normal rate, normal heart sounds and intact distal pulses. An irregularly irregular rhythm present. Exam reveals no gallop and no friction rub.  No murmur heard. Pulmonary/Chest: Effort normal and breath sounds normal. No respiratory distress. She has no wheezes. She has no rales. She exhibits no tenderness.  Abdominal: Soft. Bowel sounds are normal. She exhibits no distension and no mass. There is no tenderness. There is no rebound and no guarding.  Neurological: She is alert.  Skin: Skin is warm and dry.  Psychiatric: Mood, memory, affect and judgment normal.  Lab Results Lab Results  Component Value Date   WBC 8.4 01/01/2017   HGB 15.0 01/01/2017   HCT 46.2 (H) 01/01/2017   MCV 87.2 01/01/2017   PLT 152 01/01/2017    Lab Results  Component Value Date   CREATININE 0.58 01/02/2017   BUN 8 01/02/2017   NA 133 (L) 01/02/2017   K 4.3 01/02/2017   CL 101 01/02/2017   CO2 26 01/02/2017    Lab Results  Component Value Date   ALT 17 12/27/2016   AST 43 (H) 12/27/2016   ALKPHOS 81 12/27/2016   BILITOT 0.9 12/27/2016     Microbiology: Recent  Results (from the past 240 hour(s))  Culture, blood (Routine x 2)     Status: Abnormal   Collection Time: 12/26/16  5:19 PM  Result Value Ref Range Status   Specimen Description BLOOD BLOOD LEFT FOREARM  Final   Special Requests IN PEDIATRIC BOTTLE Blood Culture adequate volume  Final   Culture  Setup Time   Final    GRAM POSITIVE COCCI IN CLUSTERS IN PEDIATRIC BOTTLE CRITICAL VALUE NOTED.  VALUE IS CONSISTENT WITH PREVIOUSLY REPORTED AND CALLED VALUE.    Culture (A)  Final    STAPHYLOCOCCUS SPECIES (COAGULASE NEGATIVE) SUSCEPTIBILITIES PERFORMED ON PREVIOUS CULTURE WITHIN THE LAST 5 DAYS.    Report Status 12/29/2016 FINAL  Final  Culture, blood (Routine x 2)     Status: Abnormal   Collection Time: 12/26/16  5:28 PM  Result Value Ref Range Status   Specimen Description BLOOD LEFT WRIST  Final   Special Requests   Final    BOTTLES DRAWN AEROBIC ONLY Blood Culture adequate volume   Culture  Setup Time   Final    GRAM POSITIVE COCCI IN CLUSTERS AEROBIC BOTTLE ONLY CRITICAL RESULT CALLED TO, READ BACK BY AND VERIFIED WITH: ADagoberto Ligas. Johnston Pharm.D. 16:55 12/27/16 ([email or web address])    Culture STAPHYLOCOCCUS SPECIES (COAGULASE NEGATIVE) (A)  Final   Report Status 12/29/2016 FINAL  Final   Organism ID, Bacteria STAPHYLOCOCCUS SPECIES (COAGULASE NEGATIVE)  Final      Susceptibility   Staphylococcus species (coagulase negative) - MIC*    CIPROFLOXACIN <=0.5 SENSITIVE Sensitive     ERYTHROMYCIN >=8 RESISTANT Resistant     GENTAMICIN <=0.5 SENSITIVE Sensitive     OXACILLIN RESISTANT Resistant     TETRACYCLINE >=16 RESISTANT Resistant     VANCOMYCIN <=0.5 SENSITIVE Sensitive     TRIMETH/SULFA 80 RESISTANT Resistant     CLINDAMYCIN <=0.25 SENSITIVE Sensitive     RIFAMPIN <=0.5 SENSITIVE Sensitive     Inducible Clindamycin NEGATIVE Sensitive     * STAPHYLOCOCCUS SPECIES (COAGULASE NEGATIVE)  Blood Culture ID Panel (Reflexed)     Status: Abnormal   Collection Time: 12/26/16  5:28 PM  Result Value Ref  Range Status   Enterococcus species NOT DETECTED NOT DETECTED Final   Listeria monocytogenes NOT DETECTED NOT DETECTED Final   Staphylococcus species DETECTED (A) NOT DETECTED Final    Comment: Methicillin (oxacillin) resistant coagulase negative staphylococcus. Possible blood culture contaminant (unless isolated from more than one blood culture draw or clinical case suggests pathogenicity). No antibiotic treatment is indicated for blood  culture contaminants. CRITICAL RESULT CALLED TO, READ BACK BY AND VERIFIED WITH: ADagoberto Ligas. Johnston Pharm.D. 16:55 12/27/16 ([email or web address])    Staphylococcus aureus NOT DETECTED NOT DETECTED Final   Methicillin resistance DETECTED (A) NOT DETECTED Final    Comment: CRITICAL RESULT CALLED TO, READ BACK BY AND VERIFIED WITH: ADagoberto Ligas. Johnston Pharm.D. 16:55 12/27/16 ([email or web address])  Streptococcus species NOT DETECTED NOT DETECTED Final   Streptococcus agalactiae NOT DETECTED NOT DETECTED Final   Streptococcus pneumoniae NOT DETECTED NOT DETECTED Final   Streptococcus pyogenes NOT DETECTED NOT DETECTED Final   Acinetobacter baumannii NOT DETECTED NOT DETECTED Final   Enterobacteriaceae species NOT DETECTED NOT DETECTED Final   Enterobacter cloacae complex NOT DETECTED NOT DETECTED Final   Escherichia coli NOT DETECTED NOT DETECTED Final   Klebsiella oxytoca NOT DETECTED NOT DETECTED Final   Klebsiella pneumoniae NOT DETECTED NOT DETECTED Final   Proteus species NOT DETECTED NOT DETECTED Final   Serratia marcescens NOT DETECTED NOT DETECTED Final   Haemophilus influenzae NOT DETECTED NOT DETECTED Final   Neisseria meningitidis NOT DETECTED NOT DETECTED Final   Pseudomonas aeruginosa NOT DETECTED NOT DETECTED Final   Candida albicans NOT DETECTED NOT DETECTED Final   Candida glabrata NOT DETECTED NOT DETECTED Final   Candida krusei NOT DETECTED NOT DETECTED Final   Candida parapsilosis NOT DETECTED NOT DETECTED Final   Candida tropicalis NOT DETECTED NOT DETECTED Final  MRSA  PCR Screening     Status: None   Collection Time: 12/26/16  9:13 PM  Result Value Ref Range Status   MRSA by PCR NEGATIVE NEGATIVE Final    Comment:        The GeneXpert MRSA Assay (FDA approved for NASAL specimens only), is one component of a comprehensive MRSA colonization surveillance program. It is not intended to diagnose MRSA infection nor to guide or monitor treatment for MRSA infections.   Culture, blood (routine x 2)     Status: Abnormal   Collection Time: 12/28/16  1:30 PM  Result Value Ref Range Status   Specimen Description BLOOD LEFT ANTECUBITAL  Final   Special Requests   Final    BOTTLES DRAWN AEROBIC AND ANAEROBIC Blood Culture adequate volume   Culture  Setup Time   Final    GRAM POSITIVE COCCI IN CLUSTERS ANAEROBIC BOTTLE ONLY CRITICAL RESULT CALLED TO, READ BACK BY AND VERIFIED WITH: A JOHNSTON,PHARMD AT 1641 12/29/16 BY L BENFIELD    Culture (A)  Final    STAPHYLOCOCCUS SPECIES (COAGULASE NEGATIVE) SUSCEPTIBILITIES PERFORMED ON PREVIOUS CULTURE WITHIN THE LAST 5 DAYS.    Report Status 12/31/2016 FINAL  Final  Culture, blood (routine x 2)     Status: None   Collection Time: 12/28/16  1:45 PM  Result Value Ref Range Status   Specimen Description BLOOD LEFT HAND  Final   Special Requests   Final    BOTTLES DRAWN AEROBIC AND ANAEROBIC Blood Culture adequate volume   Culture NO GROWTH 5 DAYS  Final   Report Status 01/02/2017 FINAL  Final  Culture, blood (routine x 2)     Status: None (Preliminary result)   Collection Time: 12/30/16  1:10 PM  Result Value Ref Range Status   Specimen Description BLOOD RIGHT HAND  Final   Special Requests IN PEDIATRIC BOTTLE Blood Culture adequate volume  Final   Culture NO GROWTH 3 DAYS  Final   Report Status PENDING  Incomplete  Culture, blood (routine x 2)     Status: None (Preliminary result)   Collection Time: 12/30/16  1:15 PM  Result Value Ref Range Status   Specimen Description BLOOD LEFT ARM  Final   Special  Requests IN PEDIATRIC BOTTLE Blood Culture adequate volume  Final   Culture NO GROWTH 3 DAYS  Final   Report Status PENDING  Incomplete     Marcos Eke, NP [facility] [phone] Pager  01/02/2017  11:45 AM

## 2017-01-03 DIAGNOSIS — R7881 Bacteremia: Secondary | ICD-10-CM

## 2017-01-03 LAB — CBC WITH DIFFERENTIAL/PLATELET
BASOS ABS: 0 10*3/uL (ref 0.0–0.1)
Basophils Relative: 0 %
Eosinophils Absolute: 0.2 10*3/uL (ref 0.0–0.7)
Eosinophils Relative: 3 %
HEMATOCRIT: 46.6 % — AB (ref 36.0–46.0)
HEMOGLOBIN: 15.2 g/dL — AB (ref 12.0–15.0)
LYMPHS ABS: 1.6 10*3/uL (ref 0.7–4.0)
Lymphocytes Relative: 22 %
MCH: 28.5 pg (ref 26.0–34.0)
MCHC: 32.6 g/dL (ref 30.0–36.0)
MCV: 87.3 fL (ref 78.0–100.0)
MONO ABS: 0.7 10*3/uL (ref 0.1–1.0)
MONOS PCT: 9 %
NEUTROS ABS: 4.9 10*3/uL (ref 1.7–7.7)
NEUTROS PCT: 66 %
PLATELETS: 188 10*3/uL (ref 150–400)
RBC: 5.34 MIL/uL — AB (ref 3.87–5.11)
RDW: 14.8 % (ref 11.5–15.5)
WBC: 7.4 10*3/uL (ref 4.0–10.5)

## 2017-01-03 LAB — RENAL FUNCTION PANEL
Albumin: 2.7 g/dL — ABNORMAL LOW (ref 3.5–5.0)
Anion gap: 10 (ref 5–15)
BUN: 10 mg/dL (ref 6–20)
CALCIUM: 8.4 mg/dL — AB (ref 8.9–10.3)
CHLORIDE: 99 mmol/L — AB (ref 101–111)
CO2: 23 mmol/L (ref 22–32)
CREATININE: 0.58 mg/dL (ref 0.44–1.00)
GFR calc Af Amer: >60 mL/min (ref >60)
GFR calc non Af Amer: >60 mL/min (ref >60)
GLUCOSE: 163 mg/dL — AB (ref 65–99)
Phosphorus: 3.9 mg/dL (ref 2.5–4.6)
Potassium: 5.4 mmol/L — ABNORMAL HIGH (ref 3.5–5.1)
SODIUM: 132 mmol/L — AB (ref 135–145)

## 2017-01-03 LAB — GLUCOSE, CAPILLARY
GLUCOSE-CAPILLARY: 151 mg/dL — AB (ref 65–99)
Glucose-Capillary: 188 mg/dL — ABNORMAL HIGH (ref 65–99)
Glucose-Capillary: 216 mg/dL — ABNORMAL HIGH (ref 65–99)

## 2017-01-03 LAB — MAGNESIUM: Magnesium: 2.1 mg/dL (ref 1.7–2.4)

## 2017-01-03 NOTE — Progress Notes (Signed)
[facility]  Date of Admission:  12/26/2016     Total days of antibiotics 8  Vancomycin 11/6 >>          ASSESSMENT/PLAN  Coag-negative Staph Bacteremia - Has remained afebrile with repeat blood cultures on 11/10 with no growth to date. Organism ID and sensitivity pending.  Recommend continued vancomycin for 1 additional day with no further antibiotic treatment necessary given no evidence of systemic infection and negative TTE.   Therapeutic drug monitoring - BMET reviewed and kidney function is stable with current dosage of vancomycin and safe to continue.   Back/Shoulder pain - Working with physical and occupation therapy. Goal for SNF placement for rehabilitation. Additional treatments per primary.     Principal Problem:   Cerebrovascular accident (CVA) due to embolism of right middle cerebral artery (HCC) Active Problems:   Sepsis (HCC)   Subarachnoid hemorrhage   Acute pain of right shoulder   Shoulder pain, left   Epidural hematoma (HCC)   Paroxysmal atrial fibrillation (HCC)   Diabetes mellitus (HCC)   Coagulase negative Staphylococcus bacteremia   . aspirin  81 mg Oral Daily  . diltiazem  180 mg Oral Daily  . heparin subcutaneous  5,000 Units Subcutaneous Q8H  . insulin aspart  0-5 Units Subcutaneous QHS  . insulin aspart  0-9 Units Subcutaneous TID WC  . insulin glargine  7 Units Subcutaneous Daily  . magnesium oxide  400 mg Oral BID  . mouth rinse  15 mL Mouth Rinse BID  . nicotine  21 mg Transdermal Daily  . ondansetron (ZOFRAN) IV  4 mg Intravenous Once  . potassium chloride  40 mEq Oral Daily  . pravastatin  80 mg Oral QHS    SUBJECTIVE: Overall doing ok. Slightly nauseous this morning and requesting a cup of coffee.   Afebrile overnight with no leukocytosis.  No Known Allergies   Review of Systems: Review of Systems  Constitutional: Negative for chills and fever.  Respiratory: Negative for cough, sputum production,  shortness of breath and wheezing.   Cardiovascular: Negative for chest pain and leg swelling.  Gastrointestinal: Positive for nausea. Negative for abdominal pain, blood in stool, constipation, diarrhea, melena and vomiting.  Skin: Negative for rash.  Neurological: Positive for headaches. Negative for weakness.      OBJECTIVE: Vitals:   01/02/17 1806 01/02/17 2232 01/03/17 0636 01/03/17 0900  BP: 108/78 (!) 139/104 132/73 138/77  Pulse: 95 (!) 119 97 (!) 110  Resp: 20 18 (!) 25 18  Temp: 98.7 F (37.1 C) 98.3 F (36.8 C)  98.6 F (37 C)  TempSrc: Oral   Axillary  SpO2: 99% 98% 98% 98%  Weight:  244 lb (110.7 kg)    Height:       Body mass index is 37.1 kg/m.  Physical Exam  Constitutional: She is well-developed, well-nourished, and in no distress. No distress.  Cardiovascular: Normal rate, normal heart sounds and intact distal pulses. An irregularly irregular rhythm present. Exam reveals no gallop and no friction rub.  No murmur heard. Pulmonary/Chest: Breath sounds normal. No respiratory distress. She has no wheezes. She has no rales. She exhibits no tenderness.  Abdominal: Soft. Bowel sounds are normal. She exhibits no distension and no mass. There is no tenderness. There is no rebound and no guarding.  Neurological: She is alert.    Lab Results Lab Results  Component Value Date   WBC 7.4 01/03/2017   HGB 15.2 (H) 01/03/2017   HCT  46.6 (H) 01/03/2017   MCV 87.3 01/03/2017   PLT 188 01/03/2017    Lab Results  Component Value Date   CREATININE 0.58 01/03/2017   BUN 10 01/03/2017   NA 132 (L) 01/03/2017   K 5.4 (H) 01/03/2017   CL 99 (L) 01/03/2017   CO2 23 01/03/2017    Lab Results  Component Value Date   ALT 17 12/27/2016   AST 43 (H) 12/27/2016   ALKPHOS 81 12/27/2016   BILITOT 0.9 12/27/2016     Microbiology: Recent Results (from the past 240 hour(s))  Culture, blood (Routine x 2)     Status: Abnormal   Collection Time: 12/26/16  5:19 PM  Result  Value Ref Range Status   Specimen Description BLOOD BLOOD LEFT FOREARM  Final   Special Requests IN PEDIATRIC BOTTLE Blood Culture adequate volume  Final   Culture  Setup Time   Final    GRAM POSITIVE COCCI IN CLUSTERS IN PEDIATRIC BOTTLE CRITICAL VALUE NOTED.  VALUE IS CONSISTENT WITH PREVIOUSLY REPORTED AND CALLED VALUE.    Culture (A)  Final    STAPHYLOCOCCUS SPECIES (COAGULASE NEGATIVE) SUSCEPTIBILITIES PERFORMED ON PREVIOUS CULTURE WITHIN THE LAST 5 DAYS.    Report Status 12/29/2016 FINAL  Final  Culture, blood (Routine x 2)     Status: Abnormal   Collection Time: 12/26/16  5:28 PM  Result Value Ref Range Status   Specimen Description BLOOD LEFT WRIST  Final   Special Requests   Final    BOTTLES DRAWN AEROBIC ONLY Blood Culture adequate volume   Culture  Setup Time   Final    GRAM POSITIVE COCCI IN CLUSTERS AEROBIC BOTTLE ONLY CRITICAL RESULT CALLED TO, READ BACK BY AND VERIFIED WITH: ADagoberto Ligas.D. 16:55 12/27/16 (wilsonm)    Culture STAPHYLOCOCCUS SPECIES (COAGULASE NEGATIVE) (A)  Final   Report Status 12/29/2016 FINAL  Final   Organism ID, Bacteria STAPHYLOCOCCUS SPECIES (COAGULASE NEGATIVE)  Final      Susceptibility   Staphylococcus species (coagulase negative) - MIC*    CIPROFLOXACIN <=0.5 SENSITIVE Sensitive     ERYTHROMYCIN >=8 RESISTANT Resistant     GENTAMICIN <=0.5 SENSITIVE Sensitive     OXACILLIN RESISTANT Resistant     TETRACYCLINE >=16 RESISTANT Resistant     VANCOMYCIN <=0.5 SENSITIVE Sensitive     TRIMETH/SULFA 80 RESISTANT Resistant     CLINDAMYCIN <=0.25 SENSITIVE Sensitive     RIFAMPIN <=0.5 SENSITIVE Sensitive     Inducible Clindamycin NEGATIVE Sensitive     * STAPHYLOCOCCUS SPECIES (COAGULASE NEGATIVE)  Blood Culture ID Panel (Reflexed)     Status: Abnormal   Collection Time: 12/26/16  5:28 PM  Result Value Ref Range Status   Enterococcus species NOT DETECTED NOT DETECTED Final   Listeria monocytogenes NOT DETECTED NOT DETECTED Final    Staphylococcus species DETECTED (A) NOT DETECTED Final    Comment: Methicillin (oxacillin) resistant coagulase negative staphylococcus. Possible blood culture contaminant (unless isolated from more than one blood culture draw or clinical case suggests pathogenicity). No antibiotic treatment is indicated for blood  culture contaminants. CRITICAL RESULT CALLED TO, READ BACK BY AND VERIFIED WITH: ADagoberto Ligas.D. 16:55 12/27/16 (wilsonm)    Staphylococcus aureus NOT DETECTED NOT DETECTED Final   Methicillin resistance DETECTED (A) NOT DETECTED Final    Comment: CRITICAL RESULT CALLED TO, READ BACK BY AND VERIFIED WITH: ADagoberto Ligas.D. 16:55 12/27/16 (wilsonm)    Streptococcus species NOT DETECTED NOT DETECTED Final   Streptococcus agalactiae NOT DETECTED NOT DETECTED Final  Streptococcus pneumoniae NOT DETECTED NOT DETECTED Final   Streptococcus pyogenes NOT DETECTED NOT DETECTED Final   Acinetobacter baumannii NOT DETECTED NOT DETECTED Final   Enterobacteriaceae species NOT DETECTED NOT DETECTED Final   Enterobacter cloacae complex NOT DETECTED NOT DETECTED Final   Escherichia coli NOT DETECTED NOT DETECTED Final   Klebsiella oxytoca NOT DETECTED NOT DETECTED Final   Klebsiella pneumoniae NOT DETECTED NOT DETECTED Final   Proteus species NOT DETECTED NOT DETECTED Final   Serratia marcescens NOT DETECTED NOT DETECTED Final   Haemophilus influenzae NOT DETECTED NOT DETECTED Final   Neisseria meningitidis NOT DETECTED NOT DETECTED Final   Pseudomonas aeruginosa NOT DETECTED NOT DETECTED Final   Candida albicans NOT DETECTED NOT DETECTED Final   Candida glabrata NOT DETECTED NOT DETECTED Final   Candida krusei NOT DETECTED NOT DETECTED Final   Candida parapsilosis NOT DETECTED NOT DETECTED Final   Candida tropicalis NOT DETECTED NOT DETECTED Final  MRSA PCR Screening     Status: None   Collection Time: 12/26/16  9:13 PM  Result Value Ref Range Status   MRSA by PCR NEGATIVE  NEGATIVE Final    Comment:        The GeneXpert MRSA Assay (FDA approved for NASAL specimens only), is one component of a comprehensive MRSA colonization surveillance program. It is not intended to diagnose MRSA infection nor to guide or monitor treatment for MRSA infections.   Culture, blood (routine x 2)     Status: Abnormal   Collection Time: 12/28/16  1:30 PM  Result Value Ref Range Status   Specimen Description BLOOD LEFT ANTECUBITAL  Final   Special Requests   Final    BOTTLES DRAWN AEROBIC AND ANAEROBIC Blood Culture adequate volume   Culture  Setup Time   Final    GRAM POSITIVE COCCI IN CLUSTERS ANAEROBIC BOTTLE ONLY CRITICAL RESULT CALLED TO, READ BACK BY AND VERIFIED WITH: A JOHNSTON,PHARMD AT 1641 12/29/16 BY L BENFIELD    Culture (A)  Final    STAPHYLOCOCCUS SPECIES (COAGULASE NEGATIVE) SUSCEPTIBILITIES PERFORMED ON PREVIOUS CULTURE WITHIN THE LAST 5 DAYS.    Report Status 12/31/2016 FINAL  Final  Culture, blood (routine x 2)     Status: None   Collection Time: 12/28/16  1:45 PM  Result Value Ref Range Status   Specimen Description BLOOD LEFT HAND  Final   Special Requests   Final    BOTTLES DRAWN AEROBIC AND ANAEROBIC Blood Culture adequate volume   Culture NO GROWTH 5 DAYS  Final   Report Status 01/02/2017 FINAL  Final  Culture, blood (routine x 2)     Status: None (Preliminary result)   Collection Time: 12/30/16  1:10 PM  Result Value Ref Range Status   Specimen Description BLOOD RIGHT HAND  Final   Special Requests IN PEDIATRIC BOTTLE Blood Culture adequate volume  Final   Culture NO GROWTH 3 DAYS  Final   Report Status PENDING  Incomplete  Culture, blood (routine x 2)     Status: None (Preliminary result)   Collection Time: 12/30/16  1:15 PM  Result Value Ref Range Status   Specimen Description BLOOD LEFT ARM  Final   Special Requests IN PEDIATRIC BOTTLE Blood Culture adequate volume  Final   Culture NO GROWTH 3 DAYS  Final   Report Status PENDING   Incomplete     Marcos EkeGreg Yadira Hada, NP [facility] [phone] Pager  01/03/2017  10:41 AM

## 2017-01-03 NOTE — Clinical Social Work Placement (Signed)
   CLINICAL SOCIAL WORK PLACEMENT  NOTE  Date:  01/03/2017  Patient Details  Name: Alison Doyle MRN: [id number] Date of Birth: Sep 03, 1935  Clinical Social Work is seeking post-discharge placement for this patient at the Skilled  Nursing Facility level of care (*CSW will initial, date and re-position this form in  chart as items are completed):  Yes   Patient/family provided with [place] Clinical Social Work Department's list of facilities offering this level of care within the geographic area requested by the patient (or if unable, by the patient's family).  Yes   Patient/family informed of their freedom to choose among providers that offer the needed level of care, that participate in Medicare, Medicaid or managed care program needed by the patient, have an available bed and are willing to accept the patient.  Yes   Patient/family informed of [facility]'s ownership interest in [facility] and [facility], as well as of the fact that they are under no obligation to receive care at these facilities.  PASRR submitted to EDS on 01/03/17     PASRR number received on 01/03/17     Existing PASRR number confirmed on       FL2 transmitted to all facilities in geographic area requested by pt/family on 01/03/17     FL2 transmitted to all facilities within larger geographic area on       Patient informed that his/her managed care company has contracts with or will negotiate with certain facilities, including the following:            Patient/family informed of bed offers received.  Patient chooses bed at       Physician recommends and patient chooses bed at      Patient to be transferred to   on  .  Patient to be transferred to facility by       Patient family notified on   of transfer.  Name of family member notified:        PHYSICIAN       Additional Comment:    _______________________________________________ Cristobal Goldmannrawford, Olukemi Panchal Bradley, LCSW 01/03/2017, 11:18  AM

## 2017-01-03 NOTE — Progress Notes (Signed)
Occupational Therapy Treatment Patient Details Name: Alison Doyle MRN: [id number] DOB: 09/21/1935 Today's Date: 01/03/2017    History of present illness 80yo [facility] for sepsis, UTI, SDH and DM.  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   OT comments  Pt is progressing very slowly.  She required max A - total A +2 for all aspects of bed mobility.  Attempted to stand with max A +2 using the stedy, but pt unable to achieve full standing.   She requires max A for simple grooming.  She demonstrates focused attention, and is very distractable.  Will continue to follow.   Follow Up Recommendations  SNF;Supervision/Assistance - 24 hour    Equipment Recommendations  None recommended by OT;Other (comment)    Recommendations for Other Services      Precautions / Restrictions Precautions Precautions: Fall       Mobility Bed Mobility Overal bed mobility: Needs Assistance Bed Mobility: Supine to Sit;Sit to Supine Rolling: +2 for physical assistance;Total assist   Supine to sit: Max assist Sit to supine: Total assist;+2 for physical assistance   General bed mobility comments: Pt able to assist minimally with moving LEs off bed and with lifting trunk.  Did not attempt to assist with any other aspect of bed mobility   Transfers Overall transfer level: Needs assistance Equipment used: Ambulation equipment used Transfers: Sit to/from Stand Sit to Stand: Max assist;+2 physical assistance         General transfer comment: With Bed height elevated.  Moved sit to stand with assist to power up and to keep hips elevated, however, she was unable to achieve upright standing despite max A +2.   Attempted a second time and pt only able to lift hips minimally with max A +2     Balance Overall balance assessment: Needs assistance Sitting-balance support: Feet supported;Single extremity supported;No upper  extremity supported Sitting balance-Leahy Scale: Poor Sitting balance - Comments: Pt requires min A - max A (variable ) to maintain static EOB sittin    Standing balance support: Bilateral upper extremity supported Standing balance-Leahy Scale: Zero Standing balance comment: unable to fully stand with max A +2                            ADL either performed or assessed with clinical judgement   ADL Overall ADL's : Needs assistance/impaired     Grooming: Wash/dry face;Maximal assistance;Sitting Grooming Details (indicate cue type and reason): when pt instructed to wash face and washcloth placed in front of her, she reached for cloth without success and proceeded to "wash face" without wash cloth.  She did this multiple times despite cues                             Functional mobility during ADLs: Maximal assistance;+2 for physical assistance       Vision   Additional Comments: As pt fatigued, Rt gaze preference very apparent.  Pt would only turn to midline with max cues/assist .  Could not locate items on Lt    Perception     Praxis      Cognition Arousal/Alertness: Awake/alert Behavior During Therapy: Flat affect Overall Cognitive Status: Impaired/Different from baseline Area of Impairment: Attention;Problem solving;Safety/judgement;Following commands  Current Attention Level: Focused   Following Commands: Follows one step commands inconsistently;Follows one step commands with increased time Safety/Judgement: Decreased awareness of deficits   Problem Solving: Slow processing;Decreased initiation;Difficulty sequencing;Requires verbal cues;Requires tactile cues General Comments: Pt followed one step commands in the context of function ~5% of the time   Pt attempted to wash her face, but did so without the washcloth in her hand, and no awareness of thie, then continued to do the same with no awareness         Exercises  Exercises: Other exercises Other Exercises Other Exercises: Pt resisted attempts at ROM due to cognition    Shoulder Instructions       General Comments      Pertinent Vitals/ Pain       Pain Assessment: Faces Faces Pain Scale: Hurts even more Pain Location: generalized "all over"  Pain Descriptors / Indicators: Moaning Pain Intervention(s): Monitored during session;Repositioned  Home Living                                          Prior Functioning/Environment              Frequency  Min 2X/week        Progress Toward Goals  OT Goals(current goals can now be found in the care plan section)  Progress towards OT goals: Progressing toward goals(slowly )     Plan Discharge plan remains appropriate    Co-evaluation    PT/OT/SLP Co-Evaluation/Treatment: Yes Reason for Co-Treatment: Complexity of the patient's impairments (multi-system involvement);Necessary to address cognition/behavior during functional activity;For patient/therapist safety;To address functional/ADL transfers   OT goals addressed during session: ADL's and self-care;Strengthening/ROM      AM-PAC PT "6 Clicks" Daily Activity     Outcome Measure   Help from another person eating meals?: Total Help from another person taking care of personal grooming?: A Lot Help from another person toileting, which includes using toliet, bedpan, or urinal?: Total Help from another person bathing (including washing, rinsing, drying)?: Total Help from another person to put on and taking off regular upper body clothing?: Total Help from another person to put on and taking off regular lower body clothing?: Total 6 Click Score: 7    End of Session Equipment Utilized During Treatment: Rolling walker;Oxygen  OT Visit Diagnosis: Muscle weakness (generalized) (M62.81);Other symptoms and signs involving cognitive function;History of falling (Z91.81);Pain;Hemiplegia and hemiparesis Hemiplegia -  Right/Left: Left Hemiplegia - dominant/non-dominant: Non-Dominant Hemiplegia - caused by: Unspecified Pain - Right/Left: (back)   Activity Tolerance Patient limited by pain;Patient limited by fatigue;Other (comment)(cognition )   Patient Left in bed;with call bell/phone within reach;with family/visitor present   Nurse Communication Mobility status        Time: [id number] OT Time Calculation (min): 40 min  Charges: OT General Charges $OT Visit: 1 Visit OT Treatments $Neuromuscular Re-education: 8-22 mins  American CanyonWendi Brantley Naser, OTR/L [phone]    Jeani HawkingConarpe, Selene Peltzer M 01/03/2017, 3:44 PM

## 2017-01-03 NOTE — Clinical Social Work Note (Signed)
Clinical Social Work Assessment  Patient Details  Name: Alison Doyle MRN: [id number] Date of Birth: [birth year]  Date of referral:  12/27/16               Reason for consult:  Facility Placement                Permission sought to share information with:  Family Supports Permission granted to share information::  Yes, Verbal Permission Granted  Name::     Alison Doyle  Agency::     Relationship::  Son  Contact Information:  [phone]  Housing/Transportation Living arrangements for the past 2 months:  Apartment Source of Information:  Patient, Adult Children Patient Interpreter Needed:  None Criminal Activity/Legal Involvement Pertinent to Current Situation/Hospitalization:  No - Comment as needed Significant Relationships:  Adult Children Lives with:    Do you feel safe going back to the place where you live?  No(Son does not feel his mom can return home and wants LTC after rehab) Need for family participation in patient care:  Yes (Comment)  Care giving concerns: Son reported that he hired someone to clean, get groceries, etc. for his mom and this person comes 4/5 times per week. Mr. Suzie Portelaayne does not feel that his mom can continue to live at home and has talked with her about going to a facility.  Social Worker assessment / plan:  CSW talked with son by phone and patient at the bedside regarding discharge disposition and recommendation of ST rehab. Ms. Suzie Portelaayne was sitting up in bed and was alert, oriented and agreeable to talking with CSW. Patient agreeable to CSW talking with her son and to short-term rehab. Ms. Suzie Portelaayne also asked CSW for a cigarette and was advised that she could not smoke in the hospital and patient responded appropriately that she can smoke in her room here in the hospital.    CSW talked with Mr. Suzie Portelaayne by phone and he expressed concern regarding his mother being able to continue living at home, even though they have hired someone to assist her at home. Son expressed  that his mom has been having issues at home for 6/8 months and he thinks a facility is the best option and has talked with his mom about this. Mr. Suzie Portelaayne reported that he is an only child and his mom has a sister and they take his mom to her medical appointments. When asked, Mr. Suzie Portelaayne explained that he has talked with his mom about LTC at a facility.  CSW talked with son about ST rehab and payment for LTC and that he will need to apply for LTC Medicaid once his mom is discharged to a facility for ST rehab and son expressed understanding.   Employment status:  Retired Database administratornsurance information:  Managed Medicare PT Recommendations:  Skilled Teacher, early years/preursing Facility Information / Referral to community resources:  Skilled Nursing Facility([facility]  Patient/Family's Response to care:  No concerns expressed by patient or son regarding her care during hospitalization.  Patient/Family's Understanding of and Emotional Response to Diagnosis, Current Treatment, and Prognosis:  Son appears to understand his mother's medical issues and current need for rehab and eventually LTC as he does not feel his mom can continue to live independently.  Emotional Assessment Appearance:  Appears stated age Attitude/Demeanor/Rapport:  Other(Appropriate) Affect (typically observed):  Appropriate Orientation:  Oriented to Self, Oriented to Situation, Oriented to Place Alcohol / Substance use:  Tobacco Use, Alcohol Use, Illicit Drugs(Patient reports that  she smokes (requested a cigarette from CSW) and does not drink or use illicit drugs) Psych involvement (Current and /or in the community):  No (Comment)  Discharge Needs  Concerns to be addressed:  Discharge Planning Concerns Readmission within the last 30 days:  No Current discharge risk:  None Barriers to Discharge:  No Barriers Identified   Cristobal GoldmannCrawford, Shyhiem Beeney Bradley, LCSW 01/03/2017, 11:04 AM

## 2017-01-03 NOTE — Progress Notes (Signed)
  Speech Language Pathology Treatment: Dysphagia;Cognitive-Linquistic  Patient Details Name: Alison Doyle MRN: [id number] DOB: 11/26/1935 Today's Date: 01/03/2017 Time: [id number] SLP Time Calculation (min) (ACUTE ONLY): 18 min  Assessment / Plan / Recommendation Clinical Impression  Pt consumed honey-thickened liquids via cup sips with minimal verbal cueing for intake with assistance d/t temperature of liquids (coffee) and known impulsivity with prior intake with pt exhibiting multiple swallows and swifter swallow response observed during intake without delayed cough noted; pt refused Dysphagia 2 consistency; pt oriented x3 during session as she indicated city, year/month and name, and increased awareness of situation for hospitalization.  Also, pt with increased awareness of deficits noted with pt able to state "I can't get my thoughts together" and stating she was "not able to stand." Recommend continue Dysphagia 2 diet/honey-thickened liquids and ST follow while in acute setting and f/u with inpatient rehab if available.  HPI HPI: 81 y.o. female, with past medical history significant for diabetes mellitus and who resides home alone, was brought for evaluation for altered mental status and fever and probably fall. The patient was seen normal last at 5 PM yesterday, she was called by the son who came to visit her at home and he found her on the floor face down near her bed. In the emergency room the patient was confused, obtunded and could not give any significant history. She was found to have UTI and CT of the head showed acute subdural hematoma, presumably from a Fall . Neurosurgery was consulted. Patient was started on antibiotics with a diagnosis of urosepsis.      SLP Plan  Continue with current plan of care       Recommendations  Diet recommendations: Dysphagia 2 (fine chop);Honey-thick liquid Liquids provided via: Cup Medication Administration: Crushed with puree Supervision: Staff  to assist with self feeding;Full supervision/cueing for compensatory strategies Compensations: Minimize environmental distractions;Slow rate;Small sips/bites Postural Changes and/or Swallow Maneuvers: Seated upright 90 degrees                Oral Care Recommendations: Oral care BID Follow up Recommendations: Inpatient Rehab SLP Visit Diagnosis: Dysphagia, oropharyngeal phase (R13.12) Attention and concentration deficit following: Cerebral infarction Plan: Continue with current plan of care       GO                Alison Stalkerat [name] 01/03/2017, 4:35 PM

## 2017-01-03 NOTE — Progress Notes (Addendum)
PROGRESS NOTE   Alison Doyle  ZOX:[id number]    DOB: [birth year]    DOA: 12/26/2016  PCP: [name], MD   I have briefly reviewed patients previous medical records in [facility].  Brief Narrative:  81 year old female with PMH of DM 2, HLD, UTI treatment in July 2018, admitted 12/26/16 with AMS, probable fall. She was found by her son lying face down on the floor near her bed. In ED assessed to have UTI and subdural hematoma on CT. Hospital course complicated by new onset A. fib with RVR.   Assessment & Plan:   Principal Problem:   Cerebrovascular accident (CVA) due to embolism of right middle cerebral artery (HCC) Active Problems:   Sepsis (HCC)   Subarachnoid hemorrhage   Acute pain of right shoulder   Shoulder pain, left   Epidural hematoma (HCC)   Paroxysmal atrial fibrillation (HCC)   Diabetes mellitus (HCC)   Coagulase negative Staphylococcus bacteremia   Acute stroke Right MCA large embolic infarct secondary to newly diagnosed A. fib not on anticoagulation Resultant left hemiplegia, left hemi-neglect and left hemianopsia MRI: Right MCA infarct with petechial hemorrhage. CTA head and neck: No LVO 2-D echo: LVEF 50-55 percent LDL 69 A1c 8.7 Neurology was consulted. Patient completed stroke workup. Neurology recommended starting aspirin in 1 week (started on 01/02/17). Consider starting Eliquis in 2 weeks if repeat head CT shows bleeding resolved or near resolution at that time. Discontinue aspirin if and when Eliquis started. Outpatient follow-up with neurology in 6 weeks. Continue dysphagia diet as per speech therapy recommendations.  Right temporal epidural hematoma status post fall Traumatic Stable in size on serial CT and MRI Neurosurgery seen and no surgical indication seen.  New onset A. Fib w RVR Rate mildly uncontrolled. Continue current dose of Cardizem but may consider increasing if heart rate not better. Anticoagulation discussion as above.  DM  2 A1c 8.7, goal <7. Mildly uncontrolled and fluctuating in the hospital. Continue current dose of Lantus and SSI.  Essential hypertension Mostly controlled.  Hyperlipidemia LDL 69, goal <70. Continue home dose of pravastatin.  Coagulase-negative staph bacteremia Infectious disease follow-up appreciated. Has remained afebrile with repeat blood cultures from 11/10 negative to date. They recommend vancomycin for one additional day and then no further antibiotic treatment needed given no evidence of systemic infection and negative TTE.  Hypokalemia Replaced  Right arm pain/supraspinatus tear/chronic low back pain Case was discussed with orthopedics on 11/12 who recommended OT for 6 weeks and possible referral to orthopedics as outpatient if no improvement.  Diarrhea ? Resolved.  Tobacco abuse Cessation counseled  DVT prophylaxis: Subcutaneous heparin Code Status: Full Family Communication: None at bedside Disposition: DC to SNF when medically improved, possibly 11/15   Consultants:  Neurosurgery Neurology Infectious disease   Procedures:  As above  Antimicrobials:  IV vancomycin    Subjective: Seen this morning. Reported that she felt bad. Complained of dizziness and back pain. Reported that she had a BM and asking for coffee to drink.   ROS:  Denies chest pain or headache.  Objective:  Vitals:   01/02/17 1806 01/02/17 2232 01/03/17 0636 01/03/17 0900  BP: 108/78 (!) 139/104 132/73 138/77  Pulse: 95 (!) 119 97 (!) 110  Resp: 20 18 (!) 25 18  Temp: 98.7 F (37.1 C) 98.3 F (36.8 C)  98.6 F (37 C)  TempSrc: Oral   Axillary  SpO2: 99% 98% 98% 98%  Weight:  110.7 kg (244 lb)    Height:  Examination:  General exam: elderly female, moderately built and frail, lying comfortably supine in bed.  Respiratory system: Clear to auscultation. Respiratory effort normal. Cardiovascular system: S1 & S2 heard, irregularly irregular . No JVD, murmurs, rubs, gallops  or clicks. No pedal edema. telemetry personally reviewed: A. fib with ventricular rate in the 100/110s. Gastrointestinal system: Abdomen is nondistended, soft and nontender. No organomegaly or masses felt. Normal bowel sounds heard. Central nervous system: Alert and oriented 2 . No focal neurological deficits. Extremities: Symmetric 5 x 5 power. Skin: No rashes, lesions or ulcers. Bruising over right side of the face, does not appear new.  Psychiatry: Judgement and insight appear normal. Mood & affect appropriate.     Data Reviewed: I have personally reviewed following labs and imaging studies  CBC: Recent Labs  Lab 12/28/16 0233 12/29/16 0854 01/01/17 0920 01/03/17 0526  WBC 10.2 7.0 8.4 7.4  NEUTROABS  --   --  6.2 4.9  HGB 14.0 14.2 15.0 15.2*  HCT 43.5 45.2 46.2* 46.6*  MCV 88.2 87.9 87.2 87.3  PLT 155 153 152 188   Basic Metabolic Panel: Recent Labs  Lab 12/28/16 0233 12/28/16 0600  12/30/16 0239 12/31/16 0236 01/01/17 0920 01/02/17 0328 01/03/17 0526  NA 137  --    < > 137 136 133* 133* 132*  K 3.0*  --    < > 3.3* 3.9 4.2 4.3 5.4*  CL 106  --    < > 107 106 102 101 99*  CO2 24  --    < > 24 24 25 26 23   GLUCOSE 219*  --    < > 163* 170* 212* 144* 163*  BUN 14  --    < > 12 11 9 8 10   CREATININE 0.74  --    < > 0.65 0.62 0.55 0.58 0.58  CALCIUM 8.3*  --    < > 8.2* 8.2* 8.2* 8.3* 8.4*  MG  --  1.7  --  1.8  --  1.8  --  2.1  PHOS 3.2  --   --   --   --   --   --  3.9   < > = values in this interval not displayed.   Liver Function Tests: Recent Labs  Lab 12/28/16 0233 01/03/17 0526  ALBUMIN 2.7* 2.7*   CBG: Recent Labs  Lab 01/02/17 1228 01/02/17 1713 01/02/17 2222 01/03/17 0756 01/03/17 1136  GLUCAP 176* 153* 177* 188* 216*    Recent Results (from the past 240 hour(s))  Culture, blood (Routine x 2)     Status: Abnormal   Collection Time: 12/26/16  5:19 PM  Result Value Ref Range Status   Specimen Description BLOOD BLOOD LEFT FOREARM  Final     Special Requests IN PEDIATRIC BOTTLE Blood Culture adequate volume  Final   Culture  Setup Time   Final    GRAM POSITIVE COCCI IN CLUSTERS IN PEDIATRIC BOTTLE CRITICAL VALUE NOTED.  VALUE IS CONSISTENT WITH PREVIOUSLY REPORTED AND CALLED VALUE.    Culture (A)  Final    STAPHYLOCOCCUS SPECIES (COAGULASE NEGATIVE) SUSCEPTIBILITIES PERFORMED ON PREVIOUS CULTURE WITHIN THE LAST 5 DAYS.    Report Status 12/29/2016 FINAL  Final  Culture, blood (Routine x 2)     Status: Abnormal   Collection Time: 12/26/16  5:28 PM  Result Value Ref Range Status   Specimen Description BLOOD LEFT WRIST  Final   Special Requests   Final    BOTTLES DRAWN AEROBIC ONLY Blood  Culture adequate volume   Culture  Setup Time   Final    GRAM POSITIVE COCCI IN CLUSTERS AEROBIC BOTTLE ONLY CRITICAL RESULT CALLED TO, READ BACK BY AND VERIFIED WITH: ADagoberto Ligas. Johnston Pharm.D. 16:55 12/27/16 ([email or web address])    Culture STAPHYLOCOCCUS SPECIES (COAGULASE NEGATIVE) (A)  Final   Report Status 12/29/2016 FINAL  Final   Organism ID, Bacteria STAPHYLOCOCCUS SPECIES (COAGULASE NEGATIVE)  Final      Susceptibility   Staphylococcus species (coagulase negative) - MIC*    CIPROFLOXACIN <=0.5 SENSITIVE Sensitive     ERYTHROMYCIN >=8 RESISTANT Resistant     GENTAMICIN <=0.5 SENSITIVE Sensitive     OXACILLIN RESISTANT Resistant     TETRACYCLINE >=16 RESISTANT Resistant     VANCOMYCIN <=0.5 SENSITIVE Sensitive     TRIMETH/SULFA 80 RESISTANT Resistant     CLINDAMYCIN <=0.25 SENSITIVE Sensitive     RIFAMPIN <=0.5 SENSITIVE Sensitive     Inducible Clindamycin NEGATIVE Sensitive     * STAPHYLOCOCCUS SPECIES (COAGULASE NEGATIVE)  Blood Culture ID Panel (Reflexed)     Status: Abnormal   Collection Time: 12/26/16  5:28 PM  Result Value Ref Range Status   Enterococcus species NOT DETECTED NOT DETECTED Final   Listeria monocytogenes NOT DETECTED NOT DETECTED Final   Staphylococcus species DETECTED (A) NOT DETECTED Final    Comment: Methicillin  (oxacillin) resistant coagulase negative staphylococcus. Possible blood culture contaminant (unless isolated from more than one blood culture draw or clinical case suggests pathogenicity). No antibiotic treatment is indicated for blood  culture contaminants. CRITICAL RESULT CALLED TO, READ BACK BY AND VERIFIED WITH: ADagoberto Ligas. Johnston Pharm.D. 16:55 12/27/16 ([email or web address])    Staphylococcus aureus NOT DETECTED NOT DETECTED Final   Methicillin resistance DETECTED (A) NOT DETECTED Final    Comment: CRITICAL RESULT CALLED TO, READ BACK BY AND VERIFIED WITH: ADagoberto Ligas. Johnston Pharm.D. 16:55 12/27/16 (wilsonm)    Streptococcus species NOT DETECTED NOT DETECTED Final   Streptococcus agalactiae NOT DETECTED NOT DETECTED Final   Streptococcus pneumoniae NOT DETECTED NOT DETECTED Final   Streptococcus pyogenes NOT DETECTED NOT DETECTED Final   Acinetobacter baumannii NOT DETECTED NOT DETECTED Final   Enterobacteriaceae species NOT DETECTED NOT DETECTED Final   Enterobacter cloacae complex NOT DETECTED NOT DETECTED Final   Escherichia coli NOT DETECTED NOT DETECTED Final   Klebsiella oxytoca NOT DETECTED NOT DETECTED Final   Klebsiella pneumoniae NOT DETECTED NOT DETECTED Final   Proteus species NOT DETECTED NOT DETECTED Final   Serratia marcescens NOT DETECTED NOT DETECTED Final   Haemophilus influenzae NOT DETECTED NOT DETECTED Final   Neisseria meningitidis NOT DETECTED NOT DETECTED Final   Pseudomonas aeruginosa NOT DETECTED NOT DETECTED Final   Candida albicans NOT DETECTED NOT DETECTED Final   Candida glabrata NOT DETECTED NOT DETECTED Final   Candida krusei NOT DETECTED NOT DETECTED Final   Candida parapsilosis NOT DETECTED NOT DETECTED Final   Candida tropicalis NOT DETECTED NOT DETECTED Final  MRSA PCR Screening     Status: None   Collection Time: 12/26/16  9:13 PM  Result Value Ref Range Status   MRSA by PCR NEGATIVE NEGATIVE Final    Comment:        The GeneXpert MRSA Assay (FDA approved for NASAL  specimens only), is one component of a comprehensive MRSA colonization surveillance program. It is not intended to diagnose MRSA infection nor to guide or monitor treatment for MRSA infections.   Culture, blood (routine x 2)     Status: Abnormal   Collection Time: 12/28/16  1:30  PM  Result Value Ref Range Status   Specimen Description BLOOD LEFT ANTECUBITAL  Final   Special Requests   Final    BOTTLES DRAWN AEROBIC AND ANAEROBIC Blood Culture adequate volume   Culture  Setup Time   Final    GRAM POSITIVE COCCI IN CLUSTERS ANAEROBIC BOTTLE ONLY CRITICAL RESULT CALLED TO, READ BACK BY AND VERIFIED WITH: A JOHNSTON,PHARMD AT 1641 12/29/16 BY L BENFIELD    Culture (A)  Final    STAPHYLOCOCCUS SPECIES (COAGULASE NEGATIVE) SUSCEPTIBILITIES PERFORMED ON PREVIOUS CULTURE WITHIN THE LAST 5 DAYS.    Report Status 12/31/2016 FINAL  Final  Culture, blood (routine x 2)     Status: None   Collection Time: 12/28/16  1:45 PM  Result Value Ref Range Status   Specimen Description BLOOD LEFT HAND  Final   Special Requests   Final    BOTTLES DRAWN AEROBIC AND ANAEROBIC Blood Culture adequate volume   Culture NO GROWTH 5 DAYS  Final   Report Status 01/02/2017 FINAL  Final  Culture, blood (routine x 2)     Status: None (Preliminary result)   Collection Time: 12/30/16  1:10 PM  Result Value Ref Range Status   Specimen Description BLOOD RIGHT HAND  Final   Special Requests IN PEDIATRIC BOTTLE Blood Culture adequate volume  Final   Culture NO GROWTH 4 DAYS  Final   Report Status PENDING  Incomplete  Culture, blood (routine x 2)     Status: None (Preliminary result)   Collection Time: 12/30/16  1:15 PM  Result Value Ref Range Status   Specimen Description BLOOD LEFT ARM  Final   Special Requests IN PEDIATRIC BOTTLE Blood Culture adequate volume  Final   Culture NO GROWTH 4 DAYS  Final   Report Status PENDING  Incomplete         Radiology Studies: No results found.      Scheduled  Meds: . aspirin  81 mg Oral Daily  . diltiazem  180 mg Oral Daily  . heparin subcutaneous  5,000 Units Subcutaneous Q8H  . insulin aspart  0-5 Units Subcutaneous QHS  . insulin aspart  0-9 Units Subcutaneous TID WC  . insulin glargine  7 Units Subcutaneous Daily  . magnesium oxide  400 mg Oral BID  . mouth rinse  15 mL Mouth Rinse BID  . nicotine  21 mg Transdermal Daily  . ondansetron (ZOFRAN) IV  4 mg Intravenous Once  . potassium chloride  40 mEq Oral Daily  . pravastatin  80 mg Oral QHS   Continuous Infusions: . vancomycin Stopped (01/03/17 1258)     LOS: 8 days     Arabela Basaldua, MD, FACP, FHM. [facility] Pager [phone]  If 7PM-7AM, please contact night-coverage [email or web address] Password [email or web address] 01/03/2017, 5:38 PM

## 2017-01-03 NOTE — [place] FL2 (Signed)
[place] MEDICAID FL2 LEVEL OF CARE SCREENING TOOL     IDENTIFICATION  Patient Name: Alison Doyle Birthdate: [birth year] Sex: female Admission Date (Current Location): 12/26/2016  [facility] [place]   Facility and Address:  [facility], [address], [place], [place] [address]      Provider Number: [id number]  Attending Physician Name and Address:  Elease EtienneHongalgi, Anand D, MD  Relative Name and Phone Number:  Stormy FabianBrandon Arrighi -son; [phone]    Current Level of Care: Hospital Recommended Level of Care: Skilled Nursing Facility Prior Approval Number:    Date Approved/Denied:   PASRR Number: [id number](Eff. 01/03/17)  Discharge Plan: SNF    Current Diagnoses: Patient Active Problem List   Diagnosis Date Noted  . Coagulase negative Staphylococcus bacteremia 12/30/2016  . Subarachnoid hemorrhage 12/28/2016  . Acute pain of right shoulder   . Shoulder pain, left   . Epidural hematoma (HCC)   . Cerebrovascular accident (CVA) due to embolism of right middle cerebral artery (HCC)   . Paroxysmal atrial fibrillation (HCC)   . Diabetes mellitus (HCC)   . Sepsis (HCC) 12/26/2016  . Toxic encephalopathy     Orientation RESPIRATION BLADDER Height & Weight     Self, Situation, Place  O2(2.5 liters oxygen) Incontinent, External catheter Weight: 244 lb (110.7 kg) Height:  5\' 8"  (172.7 cm)  BEHAVIORAL SYMPTOMS/MOOD NEUROLOGICAL BOWEL NUTRITION STATUS      Incontinent Diet(DYS 1)  AMBULATORY STATUS COMMUNICATION OF NEEDS Skin   Total Care(PT has not tried ambulation with patient.) Verbally Other (Comment)(Abrasion-elbow & eye; moisture asociated skin damage to bilateral breast & groin; skin tesr to arm & elbow, treated with foam dressing)                       Personal Care Assistance Level of Assistance  Bathing, Feeding, Dressing Bathing Assistance: Maximum assistance Feeding assistance: Limited assistance(Needs  assistance) Dressing Assistance: Maximum assistance     Functional Limitations Info  Sight, Hearing, Speech Sight Info: Impaired(Wears glasses for reading) Hearing Info: Adequate Speech Info: Adequate    SPECIAL CARE FACTORS FREQUENCY  PT (By licensed PT), OT (By licensed OT), Speech therapy     PT Frequency: Evaluated 11/9 - minimum of 5X per week therapy  OT Frequency: Evaluated 11/9 - minimum of 5X per week therapy     Speech Therapy Frequency: Evaluated 11/9      Contractures Contractures Info: Not present    Additional Factors Info  Code Status, Allergies, Insulin Sliding Scale Code Status Info: Full Allergies Info: No known allergeis   Insulin Sliding Scale Info: 0-9 units 3X per day with meals; 0-5 units daily at bedtime       Current Medications (01/03/2017):  This is the current hospital active medication list Current Facility-Administered Medications  Medication Dose Route Frequency Provider Last Rate Last Dose  . acetaminophen (TYLENOL) tablet 500 mg  500 mg Oral Q6H PRN Rhetta MuraSamtani, Jai-Gurmukh, MD   500 mg at 12/30/16 0835  . aspirin chewable tablet 81 mg  81 mg Oral Daily Rhetta MuraSamtani, Jai-Gurmukh, MD   81 mg at 01/02/17 1751  . diltiazem (CARDIZEM CD) 24 hr capsule 180 mg  180 mg Oral Daily Rhetta MuraSamtani, Jai-Gurmukh, MD   180 mg at 01/02/17 1032  . heparin injection 5,000 Units  5,000 Units Subcutaneous Q8H Marvel PlanXu, Jindong, MD   5,000 Units at 01/03/17 0544  . insulin aspart (novoLOG) injection 0-5 Units  0-5 Units Subcutaneous QHS  Carron CurieHijazi, Ali, MD   2 Units at 01/01/17 [phone]  . insulin aspart (novoLOG) injection 0-9 Units  0-9 Units Subcutaneous TID WC Carron CurieHijazi, Ali, MD   2 Units at 01/03/17 [phone]  . insulin glargine (LANTUS) injection 7 Units  7 Units Subcutaneous Daily Rhetta MuraSamtani, Jai-Gurmukh, MD   7 Units at 01/02/17 1023  . ipratropium-albuterol (DUONEB) 0.5-2.5 (3) MG/3ML nebulizer solution 3 mL  3 mL Nebulization Q6H PRN Rhetta MuraSamtani, Jai-Gurmukh, MD      . magnesium oxide (MAG-OX)  tablet 400 mg  400 mg Oral BID Rhetta MuraSamtani, Jai-Gurmukh, MD   400 mg at 01/02/17 2156  . MEDLINE mouth rinse  15 mL Mouth Rinse BID Carron CurieHijazi, Ali, MD   15 mL at 01/02/17 2156  . nicotine (NICODERM CQ - dosed in mg/24 hours) patch 21 mg  21 mg Transdermal Daily Rhetta MuraSamtani, Jai-Gurmukh, MD   21 mg at 01/02/17 1031  . ondansetron (ZOFRAN) tablet 4 mg  4 mg Oral Q6H PRN Carron CurieHijazi, Ali, MD       Or  . ondansetron (ZOFRAN) injection 4 mg  4 mg Intravenous Q6H PRN Carron CurieHijazi, Ali, MD   4 mg at 12/30/16 1024  . ondansetron (ZOFRAN) injection 4 mg  4 mg Intravenous Once Samtani, Jai-Gurmukh, MD      . potassium chloride 20 MEQ/15ML (10%) solution 40 mEq  40 mEq Oral Daily Rhetta MuraSamtani, Jai-Gurmukh, MD   40 mEq at 01/02/17 1017  . pravastatin (PRAVACHOL) tablet 80 mg  80 mg Oral QHS Rhetta MuraSamtani, Jai-Gurmukh, MD   80 mg at 01/02/17 2156  . RESOURCE THICKENUP CLEAR   Oral PRN Rhetta MuraSamtani, Jai-Gurmukh, MD      . traMADol (ULTRAM) tablet 50 mg  50 mg Oral Q6H PRN Rhetta MuraSamtani, Jai-Gurmukh, MD   50 mg at 01/02/17 0810  . vancomycin (VANCOCIN) IVPB 1000 mg/200 mL premix  1,000 mg Intravenous Q12H Rhetta MuraSamtani, Jai-Gurmukh, MD   Stopped at 01/02/17 2304  . zolpidem (AMBIEN) tablet 5 mg  5 mg Oral QHS PRN Opyd, Lavone Neriimothy S, MD   5 mg at 01/01/17 2106     Discharge Medications: Please see discharge summary for a list of discharge medications.  Relevant Imaging Results:  Relevant Lab Results:   Additional Information ss#[id number]  Cristobal GoldmannCrawford, Danuta Huseman Bradley, LCSW

## 2017-01-03 NOTE — Progress Notes (Signed)
Physical Therapy Treatment Patient Details Name: Alison Doyle MRN: [id number] DOB: 07-03-1935 Today's Date: 01/03/2017    History of Present Illness 80yo female [facility] for sepsis, UTI, SDH and DM.  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    PT Comments    Pt making slow progress towards achieving her current functional mobility goals. She continues to require heavy physical assist of two for bed mobility. Attempted x2 to stand from EOB with max A x2. Pt very limited thi session secondary to pain, fatigue and cognition. Pt would continue to benefit from skilled physical therapy services at this time while admitted and after d/c to address the below listed limitations in order to improve overall safety and independence with functional mobility.    Follow Up Recommendations  SNF     Equipment Recommendations  Wheelchair (measurements PT);Wheelchair cushion (measurements PT);Other (comment)(Hoyer Lift)    Recommendations for Other Services       Precautions / Restrictions Precautions Precautions: Fall Restrictions Weight Bearing Restrictions: No    Mobility  Bed Mobility Overal bed mobility: Needs Assistance Bed Mobility: Supine to Sit;Sit to Supine;Rolling Rolling: +2 for physical assistance;Total assist   Supine to sit: Max assist Sit to supine: Total assist;+2 for physical assistance   General bed mobility comments: Pt able to assist minimally with moving LEs off bed and with lifting trunk.  Did not attempt to assist with any other aspect of bed mobility   Transfers Overall transfer level: Needs assistance Equipment used: Ambulation equipment used Transfers: Sit to/from Stand Sit to Stand: Max assist;+2 physical assistance         General transfer comment: With Bed height elevated.  Moved sit to stand with assist to power up and to keep hips elevated, however, she was unable to achieve  upright standing despite max A +2.   Attempted a second time and pt only able to lift hips minimally with max A +2   Ambulation/Gait                 Stairs            Wheelchair Mobility    Modified Rankin (Stroke Patients Only)       Balance Overall balance assessment: Needs assistance Sitting-balance support: Feet supported;Single extremity supported;No upper extremity supported Sitting balance-Leahy Scale: Poor Sitting balance - Comments: Pt requires min A - max A (variable ) to maintain static EOB sittin    Standing balance support: Bilateral upper extremity supported Standing balance-Leahy Scale: Zero Standing balance comment: unable to fully stand with max A +2                             Cognition Arousal/Alertness: Awake/alert Behavior During Therapy: Flat affect Overall Cognitive Status: Impaired/Different from baseline Area of Impairment: Attention;Problem solving;Safety/judgement;Following commands                   Current Attention Level: Focused   Following Commands: Follows one step commands inconsistently;Follows one step commands with increased time Safety/Judgement: Decreased awareness of deficits   Problem Solving: Slow processing;Decreased initiation;Difficulty sequencing;Requires verbal cues;Requires tactile cues General Comments: Pt followed one step commands in the context of function ~5% of the time   Pt attempted to wash her face, but did so without the washcloth in her hand, and no awareness of thie, then continued to do the same with no  awareness       Exercises Other Exercises Other Exercises: Pt resisted attempts at ROM due to cognition     General Comments        Pertinent Vitals/Pain Pain Assessment: Faces Faces Pain Scale: Hurts even more Pain Location: "all over" Pain Descriptors / Indicators: Grimacing Pain Intervention(s): Monitored during session;Repositioned    Home Living                       Prior Function            PT Goals (current goals can now be found in the care plan section) Acute Rehab PT Goals PT Goal Formulation: Patient unable to participate in goal setting Time For Goal Achievement: 01/12/17 Potential to Achieve Goals: Good Progress towards PT goals: Progressing toward goals    Frequency    Min 3X/week      PT Plan Current plan remains appropriate    Co-evaluation PT/OT/SLP Co-Evaluation/Treatment: Yes Reason for Co-Treatment: Complexity of the patient's impairments (multi-system involvement);Necessary to address cognition/behavior during functional activity;For patient/therapist safety;To address functional/ADL transfers PT goals addressed during session: Mobility/safety with mobility;Balance OT goals addressed during session: ADL's and self-care;Strengthening/ROM      AM-PAC PT "6 Clicks" Daily Activity  Outcome Measure  Difficulty turning over in bed (including adjusting bedclothes, sheets and blankets)?: Unable Difficulty moving from lying on back to sitting on the side of the bed? : Unable Difficulty sitting down on and standing up from a chair with arms (e.g., wheelchair, bedside commode, etc,.)?: Unable Help needed moving to and from a bed to chair (including a wheelchair)?: Total Help needed walking in hospital room?: Total Help needed climbing 3-5 steps with a railing? : Total 6 Click Score: 6    End of Session         PT Visit Diagnosis: Hemiplegia and hemiparesis;Muscle weakness (generalized) (M62.81);Pain Hemiplegia - Right/Left: Left Pain - part of body: Shoulder     Time: [id number] PT Time Calculation (min) (ACUTE ONLY): 36 min  Charges:  $Therapeutic Activity: 8-22 mins                    G Codes:       [place] HillsJennifer Deshawna Doyle, [place]PT, [place] [phone]    Alison BevelsJennifer M Jaidin Doyle 01/03/2017, 4:44 PM

## 2017-01-04 DIAGNOSIS — R278 Other lack of coordination: Secondary | ICD-10-CM | POA: Diagnosis not present

## 2017-01-04 DIAGNOSIS — Z23 Encounter for immunization: Secondary | ICD-10-CM | POA: Diagnosis not present

## 2017-01-04 DIAGNOSIS — S0990XA Unspecified injury of head, initial encounter: Secondary | ICD-10-CM | POA: Diagnosis not present

## 2017-01-04 DIAGNOSIS — I629 Nontraumatic intracranial hemorrhage, unspecified: Secondary | ICD-10-CM | POA: Diagnosis not present

## 2017-01-04 DIAGNOSIS — M75111 Incomplete rotator cuff tear or rupture of right shoulder, not specified as traumatic: Secondary | ICD-10-CM | POA: Diagnosis not present

## 2017-01-04 DIAGNOSIS — E119 Type 2 diabetes mellitus without complications: Secondary | ICD-10-CM | POA: Diagnosis not present

## 2017-01-04 DIAGNOSIS — I480 Paroxysmal atrial fibrillation: Secondary | ICD-10-CM | POA: Diagnosis not present

## 2017-01-04 DIAGNOSIS — R471 Dysarthria and anarthria: Secondary | ICD-10-CM | POA: Diagnosis not present

## 2017-01-04 DIAGNOSIS — I69359 Hemiplegia and hemiparesis following cerebral infarction affecting unspecified side: Secondary | ICD-10-CM | POA: Diagnosis not present

## 2017-01-04 DIAGNOSIS — I4891 Unspecified atrial fibrillation: Secondary | ICD-10-CM | POA: Diagnosis not present

## 2017-01-04 DIAGNOSIS — S065X0D Traumatic subdural hemorrhage without loss of consciousness, subsequent encounter: Secondary | ICD-10-CM | POA: Diagnosis not present

## 2017-01-04 DIAGNOSIS — M25511 Pain in right shoulder: Secondary | ICD-10-CM

## 2017-01-04 DIAGNOSIS — I6200 Nontraumatic subdural hemorrhage, unspecified: Secondary | ICD-10-CM | POA: Diagnosis not present

## 2017-01-04 DIAGNOSIS — S064X9D Epidural hemorrhage with loss of consciousness of unspecified duration, subsequent encounter: Secondary | ICD-10-CM | POA: Diagnosis not present

## 2017-01-04 DIAGNOSIS — M6281 Muscle weakness (generalized): Secondary | ICD-10-CM | POA: Diagnosis not present

## 2017-01-04 DIAGNOSIS — Y92003 Bedroom of unspecified non-institutional (private) residence as the place of occurrence of the external cause: Secondary | ICD-10-CM | POA: Diagnosis not present

## 2017-01-04 DIAGNOSIS — R1312 Dysphagia, oropharyngeal phase: Secondary | ICD-10-CM | POA: Diagnosis not present

## 2017-01-04 DIAGNOSIS — W010XXA Fall on same level from slipping, tripping and stumbling without subsequent striking against object, initial encounter: Secondary | ICD-10-CM | POA: Diagnosis not present

## 2017-01-04 DIAGNOSIS — R7881 Bacteremia: Secondary | ICD-10-CM | POA: Diagnosis not present

## 2017-01-04 DIAGNOSIS — N390 Urinary tract infection, site not specified: Secondary | ICD-10-CM | POA: Diagnosis not present

## 2017-01-04 DIAGNOSIS — I63411 Cerebral infarction due to embolism of right middle cerebral artery: Secondary | ICD-10-CM | POA: Diagnosis not present

## 2017-01-04 DIAGNOSIS — E1149 Type 2 diabetes mellitus with other diabetic neurological complication: Secondary | ICD-10-CM | POA: Diagnosis not present

## 2017-01-04 DIAGNOSIS — I10 Essential (primary) hypertension: Secondary | ICD-10-CM | POA: Diagnosis not present

## 2017-01-04 LAB — BASIC METABOLIC PANEL
Anion gap: 9 (ref 5–15)
BUN: 10 mg/dL (ref 6–20)
CALCIUM: 8.4 mg/dL — AB (ref 8.9–10.3)
CHLORIDE: 101 mmol/L (ref 101–111)
CO2: 22 mmol/L (ref 22–32)
CREATININE: 0.59 mg/dL (ref 0.44–1.00)
GFR calc Af Amer: >60 mL/min (ref >60)
GFR calc non Af Amer: >60 mL/min (ref >60)
Glucose, Bld: 167 mg/dL — ABNORMAL HIGH (ref 65–99)
Potassium: 4.4 mmol/L (ref 3.5–5.1)
SODIUM: 132 mmol/L — AB (ref 135–145)

## 2017-01-04 LAB — CULTURE, BLOOD (ROUTINE X 2)
Culture: NO GROWTH
Culture: NO GROWTH
Special Requests: ADEQUATE
Special Requests: ADEQUATE

## 2017-01-04 LAB — GLUCOSE, CAPILLARY
GLUCOSE-CAPILLARY: 257 mg/dL — AB (ref 65–99)
Glucose-Capillary: 218 mg/dL — ABNORMAL HIGH (ref 65–99)

## 2017-01-04 MED ORDER — NICOTINE 21 MG/24HR TD PT24: 21.0000 mg | MEDICATED_PATCH | Freq: Every day | TRANSDERMAL | Status: DC

## 2017-01-04 MED ORDER — INSULIN ASPART 100 UNIT/ML ~~LOC~~ SOLN: 0.0000 [IU] | Freq: Three times a day (TID) | SUBCUTANEOUS | Status: AC

## 2017-01-04 MED ORDER — DILTIAZEM HCL ER COATED BEADS 180 MG PO CP24: 180.0000 mg | ORAL_CAPSULE | Freq: Every day | ORAL | Status: AC

## 2017-01-04 MED ORDER — INSULIN GLARGINE 100 UNIT/ML ~~LOC~~ SOLN: 10.0000 [IU] | Freq: Every day | SUBCUTANEOUS | Status: DC

## 2017-01-04 MED ORDER — TRAMADOL HCL 50 MG PO TABS: 50.0000 mg | ORAL_TABLET | Freq: Four times a day (QID) | ORAL | 0 refills | Status: DC | PRN

## 2017-01-04 MED ORDER — ACETAMINOPHEN 500 MG PO TABS: 500.0000 mg | ORAL_TABLET | Freq: Four times a day (QID) | ORAL | Status: AC | PRN

## 2017-01-04 MED ORDER — RESOURCE THICKENUP CLEAR PO POWD: ORAL | Status: AC

## 2017-01-04 MED ORDER — PRAVASTATIN SODIUM 80 MG PO TABS: 80.0000 mg | ORAL_TABLET | Freq: Every day | ORAL | Status: DC

## 2017-01-04 NOTE — Progress Notes (Signed)
[facility] [place] [facility].

## 2017-01-04 NOTE — Clinical Social Work Placement (Signed)
   CLINICAL SOCIAL WORK PLACEMENT  NOTE 01/04/17 - DISCHARGED TO [facility]IA [facility]ULANCE  Date:  01/04/2017  Patient Details  Name: Alison Doyle MRN: [id number] Date of Birth: August 18, 1935  Clinical Social Work is seeking post-discharge placement for this patient at the Skilled  Nursing Facility level of care (*CSW will initial, date and re-position this form in  chart as items are completed):  Yes   Patient/family provided with [facility] Clinical Social Work Department's list of facilities offering this level of care within the geographic area requested by the patient (or if unable, by the patient's family).  Yes   Patient/family informed of their freedom to choose among providers that offer the needed level of care, that participate in Medicare, Medicaid or managed care program needed by the patient, have an available bed and are willing to accept the patient.  Yes   Patient/family informed of [place]'s ownership interest in [facility] and [facility], as well as of the fact that they are under no obligation to receive care at these facilities.  PASRR submitted to EDS on 01/03/17     PASRR number received on 01/03/17     Existing PASRR number confirmed on       FL2 transmitted to all facilities in geographic area requested by pt/family on 01/03/17     FL2 transmitted to all facilities within larger geographic area on       Patient informed that his/her managed care company has contracts with or will negotiate with certain facilities, including the following:         01/04/17 - Patient/family informed of bed offers received.  [facility]    Physician recommends and patient chooses bed at      [facility].  Patient to be transferred to facility by  ambulance     Patient family notified on  01/04/17 of transfer.  Name of family member notified:   Penne LashSon, Brandon  O[place]ano by phone [phone])     PHYSICIAN       Additional Comment:    _______________________________________________ Cristobal Goldmannrawford, Tayra Dawe Bradley, LCSW 01/04/2017, 2:56 PM

## 2017-01-04 NOTE — Discharge Instructions (Signed)

## 2017-01-04 NOTE — Progress Notes (Signed)
Nutrition Follow-up  DOCUMENTATION CODES:   Obesity unspecified  INTERVENTION:   -Continue Magic Cup TID   NUTRITION DIAGNOSIS:   Inadequate oral intake related to dysphagia as evidenced by meal completion < 50%.  Progressing as pt tolerating diet with improved po intake, taking supplements  GOAL:   Patient will meet greater than or equal to 90% of their needs  Progressing  MONITOR:   PO intake, Supplement acceptance, Diet advancement, Labs, Weight trends, Skin, I & O's  REASON FOR ASSESSMENT:   Low Braden    ASSESSMENT:   Alison Doyle  is a 81 y.o. female, with past medical history significant for diabetes mellitus and who resides home alone, was brought for evaluation for altered mental status and fever and probably fall.   10/13 Diet upgraded to Dysphagia II from Dysphagia I, remains on Honey Thick Liquids. Recorded po intake 25-75%. Pt reports good appetite, eating Magic Cups at [employer] and [address]. Pt reports she on average is eating 50% of meal trays. Pt reports she has not received a Hormel Shake  Weight up since admission but now trending back down  Labs: sodium 132, potassium wdl, CBGs 151-257 Meds: ss novolog, lantus, mag ox, KCl   Diet Order:  DIET DYS 2 Room service appropriate? Yes; Fluid consistency: Honey Thick  EDUCATION NEEDS:   No education needs have been identified at this time  Skin:  Skin Assessment: Reviewed RN Assessment  Last BM:  11/13  Height:   Ht Readings from Last 1 Encounters:  12/26/16 5\' 8"  (1.727 m)    Weight:   Wt Readings from Last 1 Encounters:  01/03/17 235 lb (106.6 kg)    Ideal Body Weight:  63.6 kg  BMI:  Body mass index is 35.73 kg/m.  Estimated Nutritional Needs:   Kcal:  1700-1900  Protein:  95-110 grams  Fluid:  1.7-1.9 L   Alison Starcherate Samoria Fedorko MS, RD, LDN, CNSC [phone] Pager  [phone] Weekend/On-Call Pager

## 2017-01-04 NOTE — Discharge Summary (Signed)
Physician Discharge Summary  Alison Doyle BMW:[id number] DOB: 08/22/1935  PCP: Alison Brunette, MD  Admit date: 12/26/2016 Discharge date: 01/04/2017  Recommendations for Outpatient Follow-up:  1. M.D. at SNF in 2 days with repeat labs (CBC & BMP). 2. Recommend repeating CT head without contrast on approximately November 19 or 20 and as per neurology recommendations, if the CT shows bleeding resolved or near resolution, start Eliquis and discontinue aspirin. 3. Alison Riggs, NP/[facility] in 2 weeks. Ambulatory referral sent. 4. Dr. Merri Doyle, PCP upon discharge from SNF.  Home Health: None Equipment/Devices: None    Discharge Condition: Improved and stable  CODE STATUS: Full  Diet recommendation: Dysphagia 2 diet and honey thickened liquids. Diabetic and heart healthy modifications.  Discharge Diagnoses:  Principal Problem:   Cerebrovascular accident (CVA) due to embolism of right middle cerebral artery (HCC) Active Problems:   Sepsis (HCC)   Subarachnoid hemorrhage   Acute pain of right shoulder   Shoulder pain, left   Epidural hematoma (HCC)   Paroxysmal atrial fibrillation (HCC)   Diabetes mellitus (HCC)   Coagulase negative Staphylococcus bacteremia   Brief Summary: 81 year old female with PMH of DM 2, HLD, UTI treatment in July 2018, admitted 12/26/16 with AMS, probable fall. She was found by her son lying face down on the floor near her bed. In ED assessed to have UTI and subdural hematoma on CT. Hospital course complicated by coagulase negative Staphylococcus bacteremia and new onset A. fib with RVR.  Assessment & Plan:   Acute stroke Right MCA large embolic infarct secondary to newly diagnosed A. fib not on anticoagulation Resultant left hemiplegia, left hemi-neglect and left hemianopsia MRI: Right MCA infarct with petechial hemorrhage. CTA head and neck: No LVO 2-D echo: LVEF 50-55 percent LDL 69 A1c 8.7 Neurology was consulted. Patient  completed stroke workup. Neurology recommended starting aspirin in 1 week (which was started on 01/02/17). Neurology recommended starting Eliquis in 2 weeks (approximately 01/09/2017) if repeat head CT shows bleeding resolved or near resolution at that time. Discontinue aspirin if and when Eliquis started. Outpatient follow-up with neurology in 6 weeks. Continue dysphagia diet as per speech therapy recommendations.  Right temporal epidural hematoma status post fall Traumatic Stable in size on serial CT and MRI Neurosurgery seen and no surgical indication seen.  New onset A. Fib w RVR Rate reasonably controlled now on current dose of Cardizem. Monitor closely. Anticoagulation discussion as above.  DM 2 A1c 8.7, goal <7. Mildly uncontrolled and fluctuating in the hospital. Increased Lantus from 7 units to 10 units daily at discharge. Continue NovoLog SSI. Discontinued Victoza at discharge. Resume metformin. Monitor closely.  Essential hypertension Mostly controlled.  Hyperlipidemia LDL 69, goal <70. Pravastatin was increased from 20 mg daily to 80 mg daily.  Coagulase-negative staph bacteremia Infectious disease follow-up appreciated. Has remained afebrile with repeat blood cultures from 11/10 are negative, final report. ID recommended completing IV vancomycin on 01/04/17 and then no further antibiotic treatment needed given no evidence of systemic infection and negative TTE.  Hypokalemia Replaced. Transiently mildly hyperkalemic. Discontinued potassium supplements and magnesium supplements. Follow BMP at SNF.  Right arm pain/supraspinatus tear/chronic low back pain Case was discussed with orthopedics on 11/12 who recommended OT for 6 weeks and possible referral to orthopedics as outpatient if no improvement.  Diarrhea Resolved.  Tobacco abuse Cessation counseled. Continue nicotine patch.  Abnormal urinalysis - Asymptomatic for UTI features. Did get a couple doses of  Zosyn on admission. No urine culture sent. Microscopic  hematuria and some pyuria. Recommend repeating urine microscopy and a couple of weeks.   Consultants:  Neurosurgery Neurology Infectious disease   Procedures:  As above   Discharge Instructions  Discharge Instructions    Ambulatory referral to Neurology   Complete by:  As directed    An appointment is requested in approximately: 2 weeks Need first available appointment Follow up with stroke clinic Alison Doyle preferred, if not available, then consider Alison Doyle, [facility] or Alison Doyle whoever is available) at [facility] in about 6-8 weeks. Thanks.   Call MD for:   Complete by:  As directed    Strokelike symptoms.   Call MD for:  difficulty breathing, headache or visual disturbances   Complete by:  As directed    Call MD for:  extreme fatigue   Complete by:  As directed    Call MD for:  persistant dizziness or light-headedness   Complete by:  As directed    Call MD for:  persistant nausea and vomiting   Complete by:  As directed    Call MD for:  severe uncontrolled pain   Complete by:  As directed    Call MD for:  temperature >100.4   Complete by:  As directed    Diet - low sodium heart healthy   Complete by:  As directed    Diet Carb Modified   Complete by:  As directed    Discharge instructions   Complete by:  As directed    DIET: As per speech therapy recommendations, dysphagia 2 diet and honey thickened liquids. Diabetic and heart healthy modifications.   Increase activity slowly   Complete by:  As directed        Medication List    STOP taking these medications   VICTOZA 18 MG/3ML Sopn Generic drug:  liraglutide     TAKE these medications   acetaminophen 500 MG tablet Commonly known as:  TYLENOL Take 1 tablet (500 mg total) every 6 (six) hours as needed by mouth for mild pain, moderate pain or headache.   aspirin EC 81 MG tablet Take 81 mg by mouth daily.   diltiazem 180 MG 24 hr capsule Commonly  known as:  CARDIZEM CD Take 1 capsule (180 mg total) daily by mouth. Start taking on:  01/05/2017   insulin aspart 100 UNIT/ML injection Commonly known as:  novoLOG Inject 0-9 Units 3 (three) times daily with meals into the skin. CBG < 70: implement hypoglycemia protocol CBG 70 - 120: 0 units CBG 121 - 150: 1 unit CBG 151 - 200: 2 units CBG 201 - 250: 3 units CBG 251 - 300: 5 units CBG 301 - 350: 7 units CBG 351 - 400: 9 units CBG > 400: call MD.   insulin glargine 100 UNIT/ML injection Commonly known as:  LANTUS Inject 0.1 mLs (10 Units total) daily into the skin. Start taking on:  01/05/2017   metFORMIN 850 MG tablet Commonly known as:  GLUCOPHAGE Take 850 mg by mouth 2 (two) times daily with a meal.   nicotine 21 mg/24hr patch Commonly known as:  NICODERM CQ - dosed in mg/24 hours Place 1 patch (21 mg total) daily onto the skin. Start taking on:  01/05/2017   pravastatin 80 MG tablet Commonly known as:  PRAVACHOL Take 1 tablet (80 mg total) at bedtime by mouth. What changed:    medication strength  how much to take   RESOURCE THICKENUP CLEAR Powd Oral, As needed, other   traMADol 50 MG  tablet Commonly known as:  ULTRAM Take 1 tablet (50 mg total) every 6 (six) hours as needed by mouth for severe pain. What changed:    how much to take  when to take this  reasons to take this      Follow-up Information    Alison Brunette, MD. Schedule an appointment as soon as possible for a visit.   Specialty:  Internal Medicine Why:  Upon discharge from SNF. Contact information: [address] [place] [address] [phone]        Alison Riggs, NP. Call in 2 week(s).   Specialty:  Family Medicine Why:  An appointment is requested in approximately: 2 weeks Need first available appointment Follow up with stroke clinic Alison Doyle preferred, if not available, then consider Alison Doyle, [facility] or Alison Doyle whoever is available) at [facility]  in about 6-8 weeks. Contact information: [address] [place] [address] [phone]        M.D. at SNF. Schedule an appointment as soon as possible for a visit in 2 day(s).   Why:  To be seen with repeat labs (CBC & BMP).         No Known Allergies    Procedures/Studies: Ct Angio Head W Or Wo Contrast  Result Date: 12/27/2016 CLINICAL DATA:  Stroke follow-up EXAM: CT ANGIOGRAPHY HEAD AND NECK TECHNIQUE: Multidetector CT imaging of the head and neck was performed using the standard protocol during bolus administration of intravenous contrast. Multiplanar CT image reconstructions and MIPs were obtained to evaluate the vascular anatomy. Carotid stenosis measurements (when applicable) are obtained utilizing NASCET criteria, using the distal internal carotid diameter as the denominator. CONTRAST:  48 mL Isovue 370 I COMPARISON:  Head CT 12/27/2016 FINDINGS: CTA NECK FINDINGS Aortic arch: There is mild calcific atherosclerosis of the aortic arch. There is no aneurysm, dissection or hemodynamically significant stenosis of the visualized ascending aorta and aortic arch. Normal variant aortic arch branching pattern with the brachiocephalic and left common carotid arteries sharing a common origin. Mild atherosclerotic narrowing of the proximal left subclavian artery. Right carotid system: The right common carotid origin is widely patent. There is no common carotid or internal carotid artery dissection or aneurysm. Atherosclerotic calcification at the carotid bifurcation without hemodynamically significant stenosis. Left carotid system: The left common carotid origin is widely patent. There is no common carotid or internal carotid artery dissection or aneurysm. Atherosclerotic calcification at the carotid bifurcation without hemodynamically significant stenosis. Vertebral arteries: The vertebral system is codominant. Both vertebral artery origins are normal. Both vertebral arteries are  normal to their confluence with the basilar artery. Skeleton: There is no bony spinal canal stenosis. No lytic or blastic lesions. Other neck: The nasopharynx is clear. The oropharynx and hypopharynx are normal. The epiglottis is normal. The supraglottic larynx, glottis and subglottic larynx are normal. No retropharyngeal collection. The parapharyngeal spaces are preserved. The parotid and submandibular glands are normal. No sialolithiasis or salivary ductal dilatation. The thyroid gland is normal. There is no cervical lymphadenopathy. Upper chest: No pneumothorax or pleural effusion. No nodules or masses. Review of the MIP images confirms the above findings CTA HEAD FINDINGS Anterior circulation: --Intracranial internal carotid arteries: Normal. --Anterior cerebral arteries: Normal. --Middle cerebral arteries: There is attenuated vascularity within the distal right MCA distribution. No proximal occlusion. The left MCA is normal. --Posterior communicating arteries: Present on the right, absent on the left. Posterior circulation: --Posterior cerebral arteries: Normal. --Superior cerebellar arteries: Normal. --Basilar artery: Normal. --Anterior  inferior cerebellar arteries: Normal. --Posterior inferior cerebellar arteries: Normal. Venous sinuses: As permitted by contrast timing, patent. Anatomic variants: Diminutive left transverse sinus. Fetal origin of the right posterior cerebral artery. Delayed phase: No parenchymal contrast enhancement. Unchanged small right subdural hematoma. Unchanged small volume anterior right temporal subarachnoid blood. Hypoattenuation in the posterior right MCA territory, consistent with evolving infarct. Review of the MIP images confirms the above findings IMPRESSION: 1. No emergent large vessel occlusion. 2. Unchanged right convexity subdural hematoma and anterior right temporal subarachnoid hematoma. 3. Attenuated vascularity within the distal right MCA distribution, in the area of  ischemic change. No proximal occlusion. 4.  Aortic Atherosclerosis (ICD10-I70.0). Electronically Signed   By: Deatra Robinson M.D.   On: 12/27/2016 23:31   Dg Shoulder Right  Result Date: 12/28/2016 CLINICAL DATA:  81 year old female with acute right shoulder/ upper extremity pain after a recent fall. EXAM: RIGHT SHOULDER - 2+ VIEW COMPARISON:  Prior radiographs of the right shoulder 12/26/2016 FINDINGS: No displaced fracture identified. The humeral head is located. Mild glenohumeral and acromioclavicular joint osteoarthritis. Narrowing of the subacromial space again present. The visualized lungs clear. No bony lesion. IMPRESSION: 1. No acute fracture visualized. 2. Mild right glenohumeral and acromioclavicular joint osteoarthritis. 3. Narrowing of the subacromial space again visualized which can be seen in the setting of chronic rotator cuff injury. Electronically Signed   By: Malachy Moan M.D.   On: 12/28/2016 09:32   Dg Shoulder Right  Result Date: 12/26/2016 CLINICAL DATA:  Pt c/o right-sided shoulder pain after falling in her home today. Pt is confused and has slurred speech. Best obtainable images due to pt condition. Axillary view was unobtainable. EXAM: RIGHT SHOULDER - 2+ VIEW COMPARISON:  None. FINDINGS: There is no acute fracture or subluxation. Subacromial narrowing is noted. Bones appear osteopenic. The right lung apex is unremarkable in appearance. IMPRESSION: 1. Subacromial narrowing which can be associated with chronic rotator cuff injury. 2.  No evidence for acute  abnormality. Electronically Signed   By: Norva Pavlov M.D.   On: 12/26/2016 18:14   Dg Elbow 2 Views Right  Result Date: 12/30/2016 CLINICAL DATA:  Arm pain EXAM: RIGHT ELBOW - 2 VIEW COMPARISON:  None. FINDINGS: Is no fracture or dislocation of the elbow noted on two views. Potential scan laceration posterior to the elbow joint. Venous catheter noted IMPRESSION: No evidence of elbow fracture on two views.  Electronically Signed   By: Genevive Bi M.D.   On: 12/30/2016 13:26   Ct Head Wo Contrast  Result Date: 12/26/2016 CLINICAL DATA:  Altered level of consciousness, incontinent of stool and urine with facial bruising and slurred speech. Right-sided facial droop. EXAM: CT HEAD WITHOUT CONTRAST CT MAXILLOFACIAL WITHOUT CONTRAST CT CERVICAL SPINE WITHOUT CONTRAST TECHNIQUE: Multidetector CT imaging of the head, cervical spine, and maxillofacial structures were performed using the standard protocol without intravenous contrast. Multiplanar CT image reconstructions of the cervical spine and maxillofacial structures were also generated. COMPARISON:  None. FINDINGS: CT HEAD FINDINGS BRAIN: There is sulcal and ventricular prominence consistent with superficial and central atrophy. There is an acute right-sided subdural hematoma overlying the periphery of the right temporal lobe measuring up to 10 mm in thickness with small subarachnoid focus of hemorrhage in the right parietal lobe. Chronic moderate small vessel ischemia of periventricular subcortical white matter. No intraparenchymal hemorrhage is noted. No midline shift is seen. No large vascular territory infarct is noted. VASCULAR: Mild to moderate calcific atherosclerosis of the carotid siphons. SKULL: No skull fracture.  No significant scalp soft tissue swelling. SINUSES/ORBITS: The mastoid air-cells are clear. Mild ethmoid sinus mucosal thickening. No air-fluid levels. The included ocular globes and orbital contents are non-suspicious. Bilateral lens replacements. OTHER: None. CT MAXILLOFACIAL FINDINGS Osseous: No acute maxillofacial fracture. Orbits: Intact orbits and globes. No retrobulbar hematoma. Bilateral lens replacements. Extraocular muscles are symmetric in appearance. Sinuses: Mild ethmoid sinus mucosal thickening. Soft tissues: Right periorbital contusion with soft tissue swelling. Mild soft tissue swelling of the forehead. CT CERVICAL SPINE FINDINGS  Alignment: Intact craniocervical relationship. Osteoarthritis of the atlantodental interval with subcortical cystic change of the axis in odontoid process. Maintained cervical lordosis. Skull base and vertebrae: No acute fracture. No primary bone lesion or focal pathologic process. Soft tissues and spinal canal: No prevertebral fluid or swelling. No visible canal hematoma. Disc levels: Cervical spondylitic disc space narrowing C4 through C7 with small posterior marginal osteophytes at C5-6 C6-7 contributing to mild neural foraminal narrowing. No significant central canal stenosis. Upper chest: Scarring at the apices. Other: Moderate atherosclerosis of the extracranial carotids. IMPRESSION: 1. Acute right-sided subdural hematoma overlying the convexity of the right temporal lobe with small focus of subarachnoid hemorrhage in the adjacent right parietal lobe. The hematoma measures up to 10 mm in thickness. No significant midline shift. Critical Value/emergent results were called by telephone at the time of interpretation on 12/26/2016 at 6:50 pm to Dr. Raeford Razor , who verbally acknowledged these results. 2. Atrophy with chronic moderate small vessel ischemic disease. 3. Right periorbital and forehead soft tissue contusion and swelling. No acute maxillofacial fracture. 4. Cervical spondylosis without acute posttraumatic cervical spine fracture or subluxation. Electronically Signed   By: Tollie Eth M.D.   On: 12/26/2016 18:52   Ct Angio Neck W Or Wo Contrast  Result Date: 12/27/2016 CLINICAL DATA:  Stroke follow-up EXAM: CT ANGIOGRAPHY HEAD AND NECK TECHNIQUE: Multidetector CT imaging of the head and neck was performed using the standard protocol during bolus administration of intravenous contrast. Multiplanar CT image reconstructions and MIPs were obtained to evaluate the vascular anatomy. Carotid stenosis measurements (when applicable) are obtained utilizing NASCET criteria, using the distal internal carotid  diameter as the denominator. CONTRAST:  48 mL Isovue 370 I COMPARISON:  Head CT 12/27/2016 FINDINGS: CTA NECK FINDINGS Aortic arch: There is mild calcific atherosclerosis of the aortic arch. There is no aneurysm, dissection or hemodynamically significant stenosis of the visualized ascending aorta and aortic arch. Normal variant aortic arch branching pattern with the brachiocephalic and left common carotid arteries sharing a common origin. Mild atherosclerotic narrowing of the proximal left subclavian artery. Right carotid system: The right common carotid origin is widely patent. There is no common carotid or internal carotid artery dissection or aneurysm. Atherosclerotic calcification at the carotid bifurcation without hemodynamically significant stenosis. Left carotid system: The left common carotid origin is widely patent. There is no common carotid or internal carotid artery dissection or aneurysm. Atherosclerotic calcification at the carotid bifurcation without hemodynamically significant stenosis. Vertebral arteries: The vertebral system is codominant. Both vertebral artery origins are normal. Both vertebral arteries are normal to their confluence with the basilar artery. Skeleton: There is no bony spinal canal stenosis. No lytic or blastic lesions. Other neck: The nasopharynx is clear. The oropharynx and hypopharynx are normal. The epiglottis is normal. The supraglottic larynx, glottis and subglottic larynx are normal. No retropharyngeal collection. The parapharyngeal spaces are preserved. The parotid and submandibular glands are normal. No sialolithiasis or salivary ductal dilatation. The thyroid gland is normal. There is no  cervical lymphadenopathy. Upper chest: No pneumothorax or pleural effusion. No nodules or masses. Review of the MIP images confirms the above findings CTA HEAD FINDINGS Anterior circulation: --Intracranial internal carotid arteries: Normal. --Anterior cerebral arteries: Normal. --Middle  cerebral arteries: There is attenuated vascularity within the distal right MCA distribution. No proximal occlusion. The left MCA is normal. --Posterior communicating arteries: Present on the right, absent on the left. Posterior circulation: --Posterior cerebral arteries: Normal. --Superior cerebellar arteries: Normal. --Basilar artery: Normal. --Anterior inferior cerebellar arteries: Normal. --Posterior inferior cerebellar arteries: Normal. Venous sinuses: As permitted by contrast timing, patent. Anatomic variants: Diminutive left transverse sinus. Fetal origin of the right posterior cerebral artery. Delayed phase: No parenchymal contrast enhancement. Unchanged small right subdural hematoma. Unchanged small volume anterior right temporal subarachnoid blood. Hypoattenuation in the posterior right MCA territory, consistent with evolving infarct. Review of the MIP images confirms the above findings IMPRESSION: 1. No emergent large vessel occlusion. 2. Unchanged right convexity subdural hematoma and anterior right temporal subarachnoid hematoma. 3. Attenuated vascularity within the distal right MCA distribution, in the area of ischemic change. No proximal occlusion. 4.  Aortic Atherosclerosis (ICD10-I70.0). Electronically Signed   By: Deatra Robinson M.D.   On: 12/27/2016 23:31   Ct Cervical Spine Wo Contrast  Result Date: 12/26/2016 CLINICAL DATA:  Altered level of consciousness, incontinent of stool and urine with facial bruising and slurred speech. Right-sided facial droop. EXAM: CT HEAD WITHOUT CONTRAST CT MAXILLOFACIAL WITHOUT CONTRAST CT CERVICAL SPINE WITHOUT CONTRAST TECHNIQUE: Multidetector CT imaging of the head, cervical spine, and maxillofacial structures were performed using the standard protocol without intravenous contrast. Multiplanar CT image reconstructions of the cervical spine and maxillofacial structures were also generated. COMPARISON:  None. FINDINGS: CT HEAD FINDINGS BRAIN: There is sulcal and  ventricular prominence consistent with superficial and central atrophy. There is an acute right-sided subdural hematoma overlying the periphery of the right temporal lobe measuring up to 10 mm in thickness with small subarachnoid focus of hemorrhage in the right parietal lobe. Chronic moderate small vessel ischemia of periventricular subcortical white matter. No intraparenchymal hemorrhage is noted. No midline shift is seen. No large vascular territory infarct is noted. VASCULAR: Mild to moderate calcific atherosclerosis of the carotid siphons. SKULL: No skull fracture. No significant scalp soft tissue swelling. SINUSES/ORBITS: The mastoid air-cells are clear. Mild ethmoid sinus mucosal thickening. No air-fluid levels. The included ocular globes and orbital contents are non-suspicious. Bilateral lens replacements. OTHER: None. CT MAXILLOFACIAL FINDINGS Osseous: No acute maxillofacial fracture. Orbits: Intact orbits and globes. No retrobulbar hematoma. Bilateral lens replacements. Extraocular muscles are symmetric in appearance. Sinuses: Mild ethmoid sinus mucosal thickening. Soft tissues: Right periorbital contusion with soft tissue swelling. Mild soft tissue swelling of the forehead. CT CERVICAL SPINE FINDINGS Alignment: Intact craniocervical relationship. Osteoarthritis of the atlantodental interval with subcortical cystic change of the axis in odontoid process. Maintained cervical lordosis. Skull base and vertebrae: No acute fracture. No primary bone lesion or focal pathologic process. Soft tissues and spinal canal: No prevertebral fluid or swelling. No visible canal hematoma. Disc levels: Cervical spondylitic disc space narrowing C4 through C7 with small posterior marginal osteophytes at C5-6 C6-7 contributing to mild neural foraminal narrowing. No significant central canal stenosis. Upper chest: Scarring at the apices. Other: Moderate atherosclerosis of the extracranial carotids. IMPRESSION: 1. Acute  right-sided subdural hematoma overlying the convexity of the right temporal lobe with small focus of subarachnoid hemorrhage in the adjacent right parietal lobe. The hematoma measures up to 10 mm in thickness. No  significant midline shift. Critical Value/emergent results were called by telephone at the time of interpretation on 12/26/2016 at 6:50 pm to Dr. Raeford Razor , who verbally acknowledged these results. 2. Atrophy with chronic moderate small vessel ischemic disease. 3. Right periorbital and forehead soft tissue contusion and swelling. No acute maxillofacial fracture. 4. Cervical spondylosis without acute posttraumatic cervical spine fracture or subluxation. Electronically Signed   By: Tollie Eth M.D.   On: 12/26/2016 18:52   Mr Brain Wo Contrast  Result Date: 12/28/2016 CLINICAL DATA:  81 y/o female with right MCA infarct superimposed on posttraumatic appearing findings including right face and scalp hematoma, and right side extra-axial intracranial hemorrhage. EXAM: MRI HEAD WITHOUT CONTRAST TECHNIQUE: Multiplanar, multiecho pulse sequences of the brain and surrounding structures were obtained without intravenous contrast. COMPARISON:  CTA head and neck and head CTs 02/27/2016. FINDINGS: The examination had to be discontinued prior to completion due to patient refusal to continue. Axial in coronal diffusion weighted imaging was obtained along with axial FLAIR and T2* imaging. Confluent 5 cm area of restricted diffusion in the posterior right MCA territory, worst in the right parietal lobe. Involvement of the posterior right insula. Confluent involvement of the posterior most right corona radiata. Scattered involvement of the right motor strip (series 3, image 42.). No contralateral or posterior fossa restricted diffusion. Along the posterior right operculum area of infarction there is confluent petechial hemorrhage (series 6, image 57). But there is only mild mass effect on the right lateral ventricle  and no midline shift. Superimposed 10 mm biconvex extra-axial hemorrhage along the lateral left temporal lobe is now felt to be a small epidural hematoma in light of the subtle nondepressed skull fracture. Axial FLAIR images do suggest there is a trace superimposed broad-based right side subdural hematoma (series 5, image 13). Patent basilar cisterns. No ventriculomegaly. Mild superimposed bilateral cerebral white matter and left deep cerebellar nuclei FLAIR hyperintensity. IMPRESSION: 1. Confluent acute infarct involving a 5 cm area of the posterior right MCA territory with petechial hemorrhage but only mild regional mass effect. 2. Superimposed right hemisphere combination of epidural (stable, adjacent to the right temporal lobe with associated subtle skull fracture) and trace subdural hematoma. These are thought to be posttraumatic and unrelated to #1. 3.  The examination had to be discontinued prior to completion. Electronically Signed   By: Odessa Fleming M.D.   On: 12/28/2016 15:30   Mr Shoulder Right Wo Contrast  Result Date: 01/01/2017 CLINICAL DATA:  Chronic low back pain.  Shoulder pain. EXAM: MRI OF THE RIGHT SHOULDER WITHOUT CONTRAST TECHNIQUE: Multiplanar, multisequence MR imaging of the shoulder was performed. No intravenous contrast was administered. COMPARISON:  None FINDINGS: Rotator cuff: Mild tendinosis of the supraspinatus tendon with a large full-thickness tear measuring 2 cm in anterior- posterior dimension with 2 cm of retraction. Mild tendinosis of the infraspinatus tendon. Teres minor tendon is intact. Subscapularis tendon is intact. Muscles: Mild edema in the supraspinatus muscle likely reflecting muscle strain. Remainder the rotator cuff muscles demonstrate no focal signal abnormality. No muscle atrophy. Biceps long head: Moderate tendinosis of the intra-articular portion of the long head of the biceps tendon. Acromioclavicular Joint: Mild arthropathy of the acromioclavicular joint. Type I  acromion. Moderate amount of subacromial/subdeltoid bursal fluid. Glenohumeral Joint: Small joint effusion. High-grade partial-thickness cartilage loss of the glenohumeral joint. Labrum: Limited evaluation secondary to significant patient motion degrading image quality. Bones: No focal marrow signal abnormality. No fracture or dislocation. Other: No fluid collection or hematoma. IMPRESSION:  1. Mild tendinosis of the supraspinatus tendon with a large full-thickness tear measuring 2 cm in anterior- posterior dimension with 2 cm of retraction. 2. Mild tendinosis of the infraspinatus tendon. 3. Moderate tendinosis of the intra-articular portion of the long head of the biceps tendon. 4. Moderate osteoarthritis of the right glenohumeral joint. 5. Moderate subacromial/subdeltoid bursitis. Electronically Signed   By: Elige KoHetal  Patel   On: 01/01/2017 12:05   Dg Chest Port 1 View  Result Date: 12/28/2016 CLINICAL DATA:  History of pneumonia, followup EXAM: PORTABLE CHEST 1 VIEW COMPARISON:  Chest x-ray of 12/26/2016, and earlier on 12/26/2016 FINDINGS: There are somewhat prominent markings throughout the lungs which may be chronic but mild interstitial edema is difficult to exclude as described recently on chest x-ray report. The heart remains mildly enlarged. There are degenerative changes present particularly in the left shoulder. IMPRESSION: 1. Still some prominence of interstitial markings which may be chronic mild interstitial edema cannot be excluded. 2. Stable mild cardiomegaly. Electronically Signed   By: Dwyane DeePaul  Barry M.D.   On: 12/28/2016 09:28   Dg Chest Port 1 View  Result Date: 12/26/2016 CLINICAL DATA:  81 y/o  F; wheezing. EXAM: PORTABLE CHEST 1 VIEW COMPARISON:  12/26/2016 chest radiograph.  12/08/2005 CT chest FINDINGS: Stable normal cardiac silhouette. Aortic atherosclerosis with calcification. Stable diffusely increased reticular markings of the lungs. No focal consolidation identified. No pleural  effusion or pneumothorax. No acute osseous abnormality is evident. IMPRESSION: Stable diffusely increased reticular markings of the lungs probably representing interstitial edema and peribronchial thickening. No focal consolidation identified. Aortic atherosclerosis. Electronically Signed   By: Mitzi HansenLance  Furusawa-Stratton M.D.   On: 12/26/2016 22:37   Dg Chest Portable 1 View  Result Date: 12/26/2016 CLINICAL DATA:  Altered level of consciousness, incontinent, slurred speech, RIGHT facial droop, posturing with RIGHT arm opinion, history diabetes mellitus, last seen normal 1600 hours yesterday, smoker EXAM: PORTABLE CHEST 1 VIEW COMPARISON:  Portable exam 1719 hours compared to 07/09/2011 FINDINGS: Enlargement of cardiac silhouette. Atherosclerotic calcification aorta. Mediastinal contours and pulmonary vascularity normal. Accentuated interstitial markings in the mid to lower lungs bilaterally question mild pulmonary edema. No pleural effusion or pneumothorax. Severe osseous demineralization with LEFT glenohumeral degenerative changes noted. IMPRESSION: Enlargement of cardiac silhouette with question mild diffuse pulmonary edema. Electronically Signed   By: Ulyses SouthwardMark  Boles M.D.   On: 12/26/2016 17:34   Dg Shoulder Left  Result Date: 12/28/2016 CLINICAL DATA:  Shoulder pain EXAM: LEFT SHOULDER - 2+ VIEW COMPARISON:  Radiograph 12/28/2016, chest x-ray 07/09/2011 FINDINGS: There is mild deformity of the left humeral head which appears chronic, possibly due to remote fracture. No acute displaced fracture or dislocation is evident. Narrowing of the subacromial space suggests rotator cuff disease. Left lung apex clear IMPRESSION: 1. Chronic deformity of the left humeral head 2. Narrowed subacromial space suggests rotator cuff disease Electronically Signed   By: Jasmine PangKim  Fujinaga M.D.   On: 12/28/2016 19:35   Dg Humerus Right  Result Date: 12/28/2016 CLINICAL DATA:  Acute pain of right shoulder.  Recent fall. EXAM: RIGHT  HUMERUS - 2+ VIEW COMPARISON:  None. FINDINGS: There is no evidence of fracture or other focal bone lesions. Soft tissues are unremarkable. IMPRESSION: Negative. Electronically Signed   By: Signa Kellaylor  Stroud M.D.   On: 12/28/2016 09:32   Dg Swallowing Func-speech Pathology  Result Date: 12/27/2016 Objective Swallowing Evaluation: Type of Study: MBS-Modified Barium Swallow Study  Patient Details Name: Karma GanjaJudy C Doyle MRN: [id number] Date of Birth: 10/25/1935 Today's Date: 12/27/2016  Time: SLP Start Time (ACUTE ONLY): 0920 -SLP Stop Time (ACUTE ONLY): 16100937 SLP Time Calculation (min) (ACUTE ONLY): 17 min Past Medical History: Past Medical History: Diagnosis Date . Diabetes mellitus  Past Surgical History: Past Surgical History: Procedure Laterality Date . REPLACEMENT TOTAL KNEE Right 2001 HPI: [name].o.female,with past medical history significant for diabetes mellitus was brought for evaluation for altered mental status, fever and suspected fall. Found to have UTI and CT of the head showedevolving acute to subacute posterior right MCA infarct involving the posterior right insula, right temporal and parietal lobes, stable right subdural hematoma since yesterday. Trace associated right temporal lobe subarachnoid hemorrhage. CXR enlargement of cardiac silhouette with question mild diffuse pulmonary edema.  No Data Recorded Assessment / Plan / Recommendation CHL IP CLINICAL IMPRESSIONS 12/27/2016 Clinical Impression Labial/facial and lingual weakness led to anterior spillage, decreased bolus cohesion with sublingual spill and delayed oral transit. Pharyngeal impairments primarily due to decreased sensation and decreased timing of protective mechanisms and swallow initiating at the pyriforms with penetration noted before and during the swallow with all liquid textures (excepts teaspoon honey) without pt sensing. Thin and cup sip honey barium reached the vocal cords silently and verbal cue to cough moved  penetrates  higher in vestibule but unable to clear. Mild vallecuar and pyriform sinus initially progressing to valleculae (min) only as study progressed. She is at high aspiration risk given oropharyngeal impairments as well as cognitive deficits. Recommend initiaton of Dys 1 and honey thick liquids vis SPOON only, crush meds, full supervision and assist and small bites. ST will continue.   SLP Visit Diagnosis Dysphagia, oropharyngeal phase (R13.12) Attention and concentration deficit following -- Frontal lobe and executive function deficit following -- Impact on safety and function (No Data)   CHL IP TREATMENT RECOMMENDATION 12/27/2016 Treatment Recommendations Therapy as outlined in treatment plan below   Prognosis 12/27/2016 Prognosis for Safe Diet Advancement Good Barriers to Reach Goals Cognitive deficits Barriers/Prognosis Comment -- CHL IP DIET RECOMMENDATION 12/27/2016 SLP Diet Recommendations Dysphagia 1 (Puree) solids;Honey thick liquids Liquid Administration via Spoon Medication Administration Crushed with puree Compensations Minimize environmental distractions;Slow rate;Small sips/bites Postural Changes Seated upright at 90 degrees   CHL IP OTHER RECOMMENDATIONS 12/27/2016 Recommended Consults -- Oral Care Recommendations Oral care BID Other Recommendations --   CHL IP FOLLOW UP RECOMMENDATIONS 12/27/2016 Follow up Recommendations Inpatient Rehab   CHL IP FREQUENCY AND DURATION 12/27/2016 Speech Therapy Frequency (ACUTE ONLY) min 2x/week Treatment Duration 2 weeks      CHL IP ORAL PHASE 12/27/2016 Oral Phase Impaired Oral - Pudding Teaspoon -- Oral - Pudding Cup -- Oral - Honey Teaspoon Decreased bolus cohesion;Delayed oral transit Oral - Honey Cup Decreased bolus cohesion;Delayed oral transit Oral - Nectar Teaspoon -- Oral - Nectar Cup Decreased bolus cohesion;Delayed oral transit Oral - Nectar Straw Decreased bolus cohesion;Delayed oral transit Oral - Thin Teaspoon -- Oral - Thin Cup Decreased bolus  cohesion;Delayed oral transit;Right anterior bolus loss;Left anterior bolus loss Oral - Thin Straw NT Oral - Puree Delayed oral transit Oral - Mech Soft -- Oral - Regular -- Oral - Multi-Consistency -- Oral - Pill -- Oral Phase - Comment --  CHL IP PHARYNGEAL PHASE 12/27/2016 Pharyngeal Phase Impaired Pharyngeal- Pudding Teaspoon -- Pharyngeal -- Pharyngeal- Pudding Cup -- Pharyngeal -- Pharyngeal- Honey Teaspoon Pharyngeal residue - valleculae Pharyngeal -- Pharyngeal- Honey Cup Penetration/Aspiration before swallow;Pharyngeal residue - valleculae Pharyngeal Material enters airway, CONTACTS cords and not ejected out Pharyngeal- Nectar Teaspoon -- Pharyngeal -- Pharyngeal- Nectar Cup  Penetration/Aspiration during swallow;Penetration/Aspiration before swallow;Pharyngeal residue - valleculae;Delayed swallow initiation-pyriform sinuses Pharyngeal Material enters airway, remains ABOVE vocal cords and not ejected out Pharyngeal- Nectar Straw Penetration/Aspiration during swallow Pharyngeal Material enters airway, remains ABOVE vocal cords and not ejected out Pharyngeal- Thin Teaspoon -- Pharyngeal -- Pharyngeal- Thin Cup Pharyngeal residue - valleculae;Pharyngeal residue - pyriform;Penetration/Aspiration during swallow;Delayed swallow initiation-pyriform sinuses Pharyngeal Material enters airway, CONTACTS cords and not ejected out Pharyngeal- Thin Straw NT Pharyngeal -- Pharyngeal- Puree WFL Pharyngeal -- Pharyngeal- Mechanical Soft -- Pharyngeal -- Pharyngeal- Regular -- Pharyngeal -- Pharyngeal- Multi-consistency -- Pharyngeal -- Pharyngeal- Pill -- Pharyngeal -- Pharyngeal Comment --  CHL IP CERVICAL ESOPHAGEAL PHASE 12/27/2016 Cervical Esophageal Phase Impaired Pudding Teaspoon -- Pudding Cup -- Honey Teaspoon -- Honey Cup -- Nectar Teaspoon -- Nectar Cup -- Nectar Straw -- Thin Teaspoon -- Thin Cup -- Thin Straw -- Puree -- Mechanical Soft -- Regular -- Multi-consistency -- Pill -- Cervical Esophageal Comment -- No  flowsheet data found. Royce Macadamia 12/27/2016, 11:39 AM Breck Coons Lonell Face.Ed CCC-SLP Pager [phone]              Ct Head Code Stroke Wo Contrast  Addendum Date: 12/28/2016   ADDENDUM REPORT: 12/28/2016 11:07 ADDENDUM: I reviewed this case in person with Dr. Roda Shutters on 12/28/2016 at 1050 hours. There is a subtle nondepressed skull fracture along squamosal portion of the right temporal bone which I had observed but mistake for a vascular foramen initially. This is best seen on sagittal series 6, image 7. Therefore, the right temporal region extra-axial hemorrhage which has remained stable as of 12/27/2016 at 2244 hours is most compatible with a small epidural hematoma. CONCLUSION: Subtle nondepressed right temporal bone fracture such that the stable hyperdense right extra-axial hemorrhage is most compatible with EPIDURAL hematoma. Electronically Signed   By: Odessa Fleming M.D.   On: 12/28/2016 11:07   Result Date: 12/28/2016 CLINICAL DATA:  Code stroke. 81 year old female with left side weakness. EXAM: CT HEAD WITHOUT CONTRAST TECHNIQUE: Contiguous axial images were obtained from the base of the skull through the vertex without intravenous contrast. COMPARISON:  12/26/2016 head face and cervical spine CTs. FINDINGS: Brain: Mildly lobulated and biconvex extra-axial hemorrhage along the right temporal lobe is stable measuring up to 10-11 mm in thickness. There is trace associated right subarachnoid hemorrhage, not significantly changed. No midline shift. There is cytotoxic edema evident in the posterior right MCA territory, most pronounced in the posterior right temporal and parietal lobes on series 3, image 20. The posterior right insula is also affected (image 17). No intra-axial hemorrhage. Only mild regional mass effect. Gray-white matter differentiation elsewhere is stable. No intraventricular hemorrhage. No ventriculomegaly. No left hemisphere or posterior fossa blood products identified. Vascular: Calcified  atherosclerosis at the skull base. No suspicious intracranial vascular hyperdensity. Skull: No skull fracture identified.  Osteopenia. Sinuses/Orbits: Visualized paranasal sinuses and mastoids are stable and well pneumatized. Other: Broad-based superficial soft tissue contusion or hematoma along the right lateral face is stable. Intraorbital soft tissues and scalp soft tissues elsewhere are stable. ASPECTS [facility] Stroke Program Early CT Score) Total score (0-10 with 10 being normal): Not applicable, acute hemorrhage. IMPRESSION: 1. Evolving acute to subacute posterior right MCA infarct involving the posterior right insula, right temporal and parietal lobes. 2. Stable right subdural hematoma since yesterday. Trace associated right temporal lobe subarachnoid hemorrhage. No malignant hemorrhagic transformation of #1 at this time. 3. No skull fracture identified. Mild mass effect in the right hemisphere with no midline shift.  4. ASPECTS is not applicable, acute hemorrhage. 5. The above was relayed via text pager to Dr. Laurence Slate on 12/27/2016 at 1033 hours. Electronically Signed: By: Odessa Fleming M.D. On: 12/27/2016 10:35   Ct Maxillofacial Wo Contrast  Result Date: 12/26/2016 CLINICAL DATA:  Altered level of consciousness, incontinent of stool and urine with facial bruising and slurred speech. Right-sided facial droop. EXAM: CT HEAD WITHOUT CONTRAST CT MAXILLOFACIAL WITHOUT CONTRAST CT CERVICAL SPINE WITHOUT CONTRAST TECHNIQUE: Multidetector CT imaging of the head, cervical spine, and maxillofacial structures were performed using the standard protocol without intravenous contrast. Multiplanar CT image reconstructions of the cervical spine and maxillofacial structures were also generated. COMPARISON:  None. FINDINGS: CT HEAD FINDINGS BRAIN: There is sulcal and ventricular prominence consistent with superficial and central atrophy. There is an acute right-sided subdural hematoma overlying the periphery of the right temporal  lobe measuring up to 10 mm in thickness with small subarachnoid focus of hemorrhage in the right parietal lobe. Chronic moderate small vessel ischemia of periventricular subcortical white matter. No intraparenchymal hemorrhage is noted. No midline shift is seen. No large vascular territory infarct is noted. VASCULAR: Mild to moderate calcific atherosclerosis of the carotid siphons. SKULL: No skull fracture. No significant scalp soft tissue swelling. SINUSES/ORBITS: The mastoid air-cells are clear. Mild ethmoid sinus mucosal thickening. No air-fluid levels. The included ocular globes and orbital contents are non-suspicious. Bilateral lens replacements. OTHER: None. CT MAXILLOFACIAL FINDINGS Osseous: No acute maxillofacial fracture. Orbits: Intact orbits and globes. No retrobulbar hematoma. Bilateral lens replacements. Extraocular muscles are symmetric in appearance. Sinuses: Mild ethmoid sinus mucosal thickening. Soft tissues: Right periorbital contusion with soft tissue swelling. Mild soft tissue swelling of the forehead. CT CERVICAL SPINE FINDINGS Alignment: Intact craniocervical relationship. Osteoarthritis of the atlantodental interval with subcortical cystic change of the axis in odontoid process. Maintained cervical lordosis. Skull base and vertebrae: No acute fracture. No primary bone lesion or focal pathologic process. Soft tissues and spinal canal: No prevertebral fluid or swelling. No visible canal hematoma. Disc levels: Cervical spondylitic disc space narrowing C4 through C7 with small posterior marginal osteophytes at C5-6 C6-7 contributing to mild neural foraminal narrowing. No significant central canal stenosis. Upper chest: Scarring at the apices. Other: Moderate atherosclerosis of the extracranial carotids. IMPRESSION: 1. Acute right-sided subdural hematoma overlying the convexity of the right temporal lobe with small focus of subarachnoid hemorrhage in the adjacent right parietal lobe. The hematoma  measures up to 10 mm in thickness. No significant midline shift. Critical Value/emergent results were called by telephone at the time of interpretation on 12/26/2016 at 6:50 pm to Dr. Raeford Razor , who verbally acknowledged these results. 2. Atrophy with chronic moderate small vessel ischemic disease. 3. Right periorbital and forehead soft tissue contusion and swelling. No acute maxillofacial fracture. 4. Cervical spondylosis without acute posttraumatic cervical spine fracture or subluxation. Electronically Signed   By: Tollie Eth M.D.   On: 12/26/2016 18:52      Subjective: Seen this morning. States that she feels "fine". Denies headache or pain elsewhere. As per RN, no acute issues, no diarrhea and some intermittent mild confusion. She keeps asking for coffee despite having had a cup not long ago. Also has for a cigarette.  Discharge Exam:  Vitals:   01/03/17 1816 01/03/17 2212 01/04/17 0620 01/04/17 0900  BP: (!) 142/78 116/62 106/69 111/73  Pulse: (!) 111 87 98 84  Resp: 18 16 16 18   Temp: 98.4 F (36.9 C) 98 F (36.7 C) 98.5 F (36.9 C)  98 F (36.7 C)  TempSrc: Axillary Oral Oral Oral  SpO2: 99% 100% 98% 99%  Weight:  106.6 kg (235 lb)    Height:        General exam: elderly female, moderately built and frail, lying comfortably supine in bed.  Respiratory system: Clear to auscultation. Respiratory effort normal. Cardiovascular system: S1 & S2 heard, irregularly irregular . No JVD, murmurs, rubs, gallops or clicks. No pedal edema. telemetry personally reviewed: A. fib with ventricular rate in the 90s and at times in the 80s.. Gastrointestinal system: Abdomen is nondistended, soft and nontender. No organomegaly or masses felt. Normal bowel sounds heard. Central nervous system: Alert and oriented 2 . No focal neurological deficits. Extremities: Symmetric 5 x 5 power. Skin tears over the right elbow and left forearm dressing clean and dry. Skin: No rashes, lesions or ulcers.  Bruising over right side of the face, does not appear new.  Psychiatry: Judgement and insight appear normal. Mood & affect appropriate.       The results of significant diagnostics from this hospitalization (including imaging, microbiology, ancillary and laboratory) are listed below for reference.     Microbiology: Recent Results (from the past 240 hour(s))  Culture, blood (Routine x 2)     Status: Abnormal   Collection Time: 12/26/16  5:19 PM  Result Value Ref Range Status   Specimen Description BLOOD BLOOD LEFT FOREARM  Final   Special Requests IN PEDIATRIC BOTTLE Blood Culture adequate volume  Final   Culture  Setup Time   Final    GRAM POSITIVE COCCI IN CLUSTERS IN PEDIATRIC BOTTLE CRITICAL VALUE NOTED.  VALUE IS CONSISTENT WITH PREVIOUSLY REPORTED AND CALLED VALUE.    Culture (A)  Final    STAPHYLOCOCCUS SPECIES (COAGULASE NEGATIVE) SUSCEPTIBILITIES PERFORMED ON PREVIOUS CULTURE WITHIN THE LAST 5 DAYS.    Report Status 12/29/2016 FINAL  Final  Culture, blood (Routine x 2)     Status: Abnormal   Collection Time: 12/26/16  5:28 PM  Result Value Ref Range Status   Specimen Description BLOOD LEFT WRIST  Final   Special Requests   Final    BOTTLES DRAWN AEROBIC ONLY Blood Culture adequate volume   Culture  Setup Time   Final    GRAM POSITIVE COCCI IN CLUSTERS AEROBIC BOTTLE ONLY CRITICAL RESULT CALLED TO, READ BACK BY AND VERIFIED WITH: ADagoberto Ligas.D. 16:55 12/27/16 (wilsonm)    Culture STAPHYLOCOCCUS SPECIES (COAGULASE NEGATIVE) (A)  Final   Report Status 12/29/2016 FINAL  Final   Organism ID, Bacteria STAPHYLOCOCCUS SPECIES (COAGULASE NEGATIVE)  Final      Susceptibility   Staphylococcus species (coagulase negative) - MIC*    CIPROFLOXACIN <=0.5 SENSITIVE Sensitive     ERYTHROMYCIN >=8 RESISTANT Resistant     GENTAMICIN <=0.5 SENSITIVE Sensitive     OXACILLIN RESISTANT Resistant     TETRACYCLINE >=16 RESISTANT Resistant     VANCOMYCIN <=0.5 SENSITIVE Sensitive      TRIMETH/SULFA 80 RESISTANT Resistant     CLINDAMYCIN <=0.25 SENSITIVE Sensitive     RIFAMPIN <=0.5 SENSITIVE Sensitive     Inducible Clindamycin NEGATIVE Sensitive     * STAPHYLOCOCCUS SPECIES (COAGULASE NEGATIVE)  Blood Culture ID Panel (Reflexed)     Status: Abnormal   Collection Time: 12/26/16  5:28 PM  Result Value Ref Range Status   Enterococcus species NOT DETECTED NOT DETECTED Final   Listeria monocytogenes NOT DETECTED NOT DETECTED Final   Staphylococcus species DETECTED (A) NOT DETECTED Final    Comment: Methicillin (  oxacillin) resistant coagulase negative staphylococcus. Possible blood culture contaminant (unless isolated from more than one blood culture draw or clinical case suggests pathogenicity). No antibiotic treatment is indicated for blood  culture contaminants. CRITICAL RESULT CALLED TO, READ BACK BY AND VERIFIED WITH: ADagoberto Ligas.D. 16:55 12/27/16 (wilsonm)    Staphylococcus aureus NOT DETECTED NOT DETECTED Final   Methicillin resistance DETECTED (A) NOT DETECTED Final    Comment: CRITICAL RESULT CALLED TO, READ BACK BY AND VERIFIED WITH: ADagoberto Ligas.D. 16:55 12/27/16 (wilsonm)    Streptococcus species NOT DETECTED NOT DETECTED Final   Streptococcus agalactiae NOT DETECTED NOT DETECTED Final   Streptococcus pneumoniae NOT DETECTED NOT DETECTED Final   Streptococcus pyogenes NOT DETECTED NOT DETECTED Final   Acinetobacter baumannii NOT DETECTED NOT DETECTED Final   Enterobacteriaceae species NOT DETECTED NOT DETECTED Final   Enterobacter cloacae complex NOT DETECTED NOT DETECTED Final   Escherichia coli NOT DETECTED NOT DETECTED Final   Klebsiella oxytoca NOT DETECTED NOT DETECTED Final   Klebsiella pneumoniae NOT DETECTED NOT DETECTED Final   Proteus species NOT DETECTED NOT DETECTED Final   Serratia marcescens NOT DETECTED NOT DETECTED Final   Haemophilus influenzae NOT DETECTED NOT DETECTED Final   Neisseria meningitidis NOT DETECTED NOT DETECTED  Final   Pseudomonas aeruginosa NOT DETECTED NOT DETECTED Final   Candida albicans NOT DETECTED NOT DETECTED Final   Candida glabrata NOT DETECTED NOT DETECTED Final   Candida krusei NOT DETECTED NOT DETECTED Final   Candida parapsilosis NOT DETECTED NOT DETECTED Final   Candida tropicalis NOT DETECTED NOT DETECTED Final  MRSA PCR Screening     Status: None   Collection Time: 12/26/16  9:13 PM  Result Value Ref Range Status   MRSA by PCR NEGATIVE NEGATIVE Final    Comment:        The GeneXpert MRSA Assay (FDA approved for NASAL specimens only), is one component of a comprehensive MRSA colonization surveillance program. It is not intended to diagnose MRSA infection nor to guide or monitor treatment for MRSA infections.   Culture, blood (routine x 2)     Status: Abnormal   Collection Time: 12/28/16  1:30 PM  Result Value Ref Range Status   Specimen Description BLOOD LEFT ANTECUBITAL  Final   Special Requests   Final    BOTTLES DRAWN AEROBIC AND ANAEROBIC Blood Culture adequate volume   Culture  Setup Time   Final    GRAM POSITIVE COCCI IN CLUSTERS ANAEROBIC BOTTLE ONLY CRITICAL RESULT CALLED TO, READ BACK BY AND VERIFIED WITH: A JOHNSTON,PHARMD AT 1641 12/29/16 BY L BENFIELD    Culture (A)  Final    STAPHYLOCOCCUS SPECIES (COAGULASE NEGATIVE) SUSCEPTIBILITIES PERFORMED ON PREVIOUS CULTURE WITHIN THE LAST 5 DAYS.    Report Status 12/31/2016 FINAL  Final  Culture, blood (routine x 2)     Status: None   Collection Time: 12/28/16  1:45 PM  Result Value Ref Range Status   Specimen Description BLOOD LEFT HAND  Final   Special Requests   Final    BOTTLES DRAWN AEROBIC AND ANAEROBIC Blood Culture adequate volume   Culture NO GROWTH 5 DAYS  Final   Report Status 01/02/2017 FINAL  Final  Culture, blood (routine x 2)     Status: None   Collection Time: 12/30/16  1:10 PM  Result Value Ref Range Status   Specimen Description BLOOD RIGHT HAND  Final   Special Requests IN PEDIATRIC  BOTTLE Blood Culture adequate volume  Final   Culture  NO GROWTH 5 DAYS  Final   Report Status 01/04/2017 FINAL  Final  Culture, blood (routine x 2)     Status: None   Collection Time: 12/30/16  1:15 PM  Result Value Ref Range Status   Specimen Description BLOOD LEFT ARM  Final   Special Requests IN PEDIATRIC BOTTLE Blood Culture adequate volume  Final   Culture NO GROWTH 5 DAYS  Final   Report Status 01/04/2017 FINAL  Final     Labs: CBC: Recent Labs  Lab 12/29/16 0854 01/01/17 0920 01/03/17 0526  WBC 7.0 8.4 7.4  NEUTROABS  --  6.2 4.9  HGB 14.2 15.0 15.2*  HCT 45.2 46.2* 46.6*  MCV 87.9 87.2 87.3  PLT 153 152 188   Basic Metabolic Panel: Recent Labs  Lab 12/30/16 0239 12/31/16 0236 01/01/17 0920 01/02/17 0328 01/03/17 0526 01/04/17 0711  NA 137 136 133* 133* 132* 132*  K 3.3* 3.9 4.2 4.3 5.4* 4.4  CL 107 106 102 101 99* 101  CO2 24 24 25 26 23 22   GLUCOSE 163* 170* 212* 144* 163* 167*  BUN 12 11 9 8 10 10   CREATININE 0.65 0.62 0.55 0.58 0.58 0.59  CALCIUM 8.2* 8.2* 8.2* 8.3* 8.4* 8.4*  MG 1.8  --  1.8  --  2.1  --   PHOS  --   --   --   --  3.9  --    Liver Function Tests: Recent Labs  Lab 01/03/17 0526  ALBUMIN 2.7*   CBG: Recent Labs  Lab 01/03/17 0756 01/03/17 1136 01/03/17 1740 01/04/17 0752 01/04/17 1144  GLUCAP 188* 216* 151* 257* 218*    Urinalysis    Component Value Date/Time   COLORURINE AMBER (A) 12/26/2016 1653   APPEARANCEUR CLOUDY (A) 12/26/2016 1653   LABSPEC 1.027 12/26/2016 1653   PHURINE 5.0 12/26/2016 1653   GLUCOSEU >=500 (A) 12/26/2016 1653   HGBUR LARGE (A) 12/26/2016 1653   BILIRUBINUR NEGATIVE 12/26/2016 1653   KETONESUR 20 (A) 12/26/2016 1653   PROTEINUR >=300 (A) 12/26/2016 1653   UROBILINOGEN 0.2 11/29/2014 2049   NITRITE POSITIVE (A) 12/26/2016 1653   LEUKOCYTESUR NEGATIVE 12/26/2016 1653   Discussed in detail with patient's son via phone. Updated care and answered questions.   Time coordinating discharge:  Over 30 minutes  SIGNED:  Marcellus Scott, MD, FACP, [facility]. [facility] Pager [phone]  If 7PM-7AM, please contact night-coverage [email or web address] Password [email or web address] 01/04/2017, 1:48 PM

## 2017-01-05 DIAGNOSIS — M75111 Incomplete rotator cuff tear or rupture of right shoulder, not specified as traumatic: Secondary | ICD-10-CM | POA: Diagnosis not present

## 2017-01-05 DIAGNOSIS — I4891 Unspecified atrial fibrillation: Secondary | ICD-10-CM | POA: Diagnosis not present

## 2017-01-05 DIAGNOSIS — I63411 Cerebral infarction due to embolism of right middle cerebral artery: Secondary | ICD-10-CM | POA: Diagnosis not present

## 2017-01-05 DIAGNOSIS — S065X0D Traumatic subdural hemorrhage without loss of consciousness, subsequent encounter: Secondary | ICD-10-CM | POA: Diagnosis not present

## 2017-01-09 ENCOUNTER — Telehealth: Payer: Self-pay | Admitting: Neurology

## 2017-01-09 NOTE — Telephone Encounter (Signed)
Rn call Alison Doyle back at [phone]. Rn stated the pt is in a medical SNF facility. The discharge orders are for the MD at the facility to do like lab work, or any scans or recommendations. Alison Doyle stated she will let the Md at the SNF facility know. Rn stated the scans are usually done at [facility] so our MD can see it. Alison Doyle verbalized understanding.

## 2017-01-09 NOTE — Telephone Encounter (Signed)
Rn spoke with Dr. Roda ShuttersXu about Alison Doyle from [facility] calling to get order for Ct scan. Per Dr. Roda ShuttersXu via telephone the attending MD at the nursing needs to order Ct scan according to the discharge instructions. PT was discharge by internal medicine not Dr. Roda ShuttersXu.. Rn will relay the message to Alison Doyle.

## 2017-01-09 NOTE — Telephone Encounter (Signed)
Alison Doyle/[facility] (p) [phone] (f) [phone] called providers discharge orders read to schedule CT approximately on 11/19 or 11/20. They will need order faxed to her and she will schedule the CT.

## 2017-01-09 NOTE — Telephone Encounter (Signed)
Thanks, Katrina. Will follow up the CT result.   Marvel PlanJindong Mikalah Skyles, MD PhD Stroke Neurology 01/09/2017 10:37 PM

## 2017-01-10 DIAGNOSIS — I6200 Nontraumatic subdural hemorrhage, unspecified: Secondary | ICD-10-CM | POA: Diagnosis not present

## 2017-01-10 DIAGNOSIS — I69359 Hemiplegia and hemiparesis following cerebral infarction affecting unspecified side: Secondary | ICD-10-CM | POA: Diagnosis not present

## 2017-01-10 DIAGNOSIS — E119 Type 2 diabetes mellitus without complications: Secondary | ICD-10-CM | POA: Diagnosis not present

## 2017-01-10 DIAGNOSIS — I480 Paroxysmal atrial fibrillation: Secondary | ICD-10-CM | POA: Diagnosis not present

## 2017-01-16 DIAGNOSIS — I4891 Unspecified atrial fibrillation: Secondary | ICD-10-CM | POA: Diagnosis not present

## 2017-01-16 DIAGNOSIS — S065X0D Traumatic subdural hemorrhage without loss of consciousness, subsequent encounter: Secondary | ICD-10-CM | POA: Diagnosis not present

## 2017-01-16 DIAGNOSIS — M75111 Incomplete rotator cuff tear or rupture of right shoulder, not specified as traumatic: Secondary | ICD-10-CM | POA: Diagnosis not present

## 2017-01-16 DIAGNOSIS — I63411 Cerebral infarction due to embolism of right middle cerebral artery: Secondary | ICD-10-CM | POA: Diagnosis not present

## 2017-01-18 ENCOUNTER — Other Ambulatory Visit: Payer: Self-pay | Admitting: Internal Medicine

## 2017-01-18 DIAGNOSIS — I639 Cerebral infarction, unspecified: Secondary | ICD-10-CM

## 2017-01-22 ENCOUNTER — Other Ambulatory Visit: Payer: Medicare Other

## 2017-01-25 ENCOUNTER — Emergency Department (HOSPITAL_COMMUNITY): Payer: Medicare Other

## 2017-01-25 ENCOUNTER — Emergency Department (HOSPITAL_COMMUNITY)
Admission: EM | Admit: 2017-01-25 | Discharge: 2017-01-26 | Disposition: A | Discharge status: Home / Self Care | Payer: Medicare Other | Attending: Emergency Medicine | Admitting: Emergency Medicine

## 2017-01-25 DIAGNOSIS — Y9389 Activity, other specified: Secondary | ICD-10-CM | POA: Diagnosis not present

## 2017-01-25 DIAGNOSIS — M25562 Pain in left knee: Secondary | ICD-10-CM | POA: Insufficient documentation

## 2017-01-25 DIAGNOSIS — S0083XA Contusion of other part of head, initial encounter: Secondary | ICD-10-CM | POA: Insufficient documentation

## 2017-01-25 DIAGNOSIS — S199XXA Unspecified injury of neck, initial encounter: Secondary | ICD-10-CM | POA: Diagnosis not present

## 2017-01-25 DIAGNOSIS — S0990XA Unspecified injury of head, initial encounter: Secondary | ICD-10-CM | POA: Diagnosis not present

## 2017-01-25 DIAGNOSIS — R51 Headache: Secondary | ICD-10-CM | POA: Diagnosis not present

## 2017-01-25 DIAGNOSIS — R110 Nausea: Secondary | ICD-10-CM | POA: Diagnosis not present

## 2017-01-25 DIAGNOSIS — N3000 Acute cystitis without hematuria: Secondary | ICD-10-CM | POA: Diagnosis not present

## 2017-01-25 DIAGNOSIS — S8991XA Unspecified injury of right lower leg, initial encounter: Secondary | ICD-10-CM | POA: Diagnosis not present

## 2017-01-25 DIAGNOSIS — Z79899 Other long term (current) drug therapy: Secondary | ICD-10-CM | POA: Insufficient documentation

## 2017-01-25 DIAGNOSIS — F17210 Nicotine dependence, cigarettes, uncomplicated: Secondary | ICD-10-CM | POA: Diagnosis not present

## 2017-01-25 DIAGNOSIS — Y998 Other external cause status: Secondary | ICD-10-CM | POA: Insufficient documentation

## 2017-01-25 DIAGNOSIS — W050XXA Fall from non-moving wheelchair, initial encounter: Secondary | ICD-10-CM | POA: Diagnosis not present

## 2017-01-25 DIAGNOSIS — Z794 Long term (current) use of insulin: Secondary | ICD-10-CM | POA: Diagnosis not present

## 2017-01-25 DIAGNOSIS — E119 Type 2 diabetes mellitus without complications: Secondary | ICD-10-CM | POA: Diagnosis not present

## 2017-01-25 DIAGNOSIS — W19XXXA Unspecified fall, initial encounter: Secondary | ICD-10-CM

## 2017-01-25 DIAGNOSIS — Y929 Unspecified place or not applicable: Secondary | ICD-10-CM | POA: Diagnosis not present

## 2017-01-25 DIAGNOSIS — F17228 Nicotine dependence, chewing tobacco, with other nicotine-induced disorders: Secondary | ICD-10-CM | POA: Diagnosis not present

## 2017-01-25 DIAGNOSIS — Z96651 Presence of right artificial knee joint: Secondary | ICD-10-CM | POA: Insufficient documentation

## 2017-01-25 DIAGNOSIS — M542 Cervicalgia: Secondary | ICD-10-CM | POA: Diagnosis not present

## 2017-01-25 DIAGNOSIS — Z7982 Long term (current) use of aspirin: Secondary | ICD-10-CM | POA: Insufficient documentation

## 2017-01-25 DIAGNOSIS — Z7902 Long term (current) use of antithrombotics/antiplatelets: Secondary | ICD-10-CM | POA: Insufficient documentation

## 2017-01-25 DIAGNOSIS — Z8673 Personal history of transient ischemic attack (TIA), and cerebral infarction without residual deficits: Secondary | ICD-10-CM | POA: Diagnosis not present

## 2017-01-25 DIAGNOSIS — M25561 Pain in right knee: Secondary | ICD-10-CM | POA: Insufficient documentation

## 2017-01-25 DIAGNOSIS — R079 Chest pain, unspecified: Secondary | ICD-10-CM | POA: Diagnosis not present

## 2017-01-25 DIAGNOSIS — S064X0A Epidural hemorrhage without loss of consciousness, initial encounter: Secondary | ICD-10-CM | POA: Diagnosis not present

## 2017-01-25 LAB — CBC WITH DIFFERENTIAL/PLATELET
BASOS ABS: 0 10*3/uL (ref 0.0–0.1)
BASOS PCT: 0 %
EOS ABS: 0 10*3/uL (ref 0.0–0.7)
Eosinophils Relative: 0 %
HCT: 50 % — ABNORMAL HIGH (ref 36.0–46.0)
Hemoglobin: 16.8 g/dL — ABNORMAL HIGH (ref 12.0–15.0)
Lymphocytes Relative: 21 %
Lymphs Abs: 2.2 10*3/uL (ref 0.7–4.0)
MCH: 28.9 pg (ref 26.0–34.0)
MCHC: 33.6 g/dL (ref 30.0–36.0)
MCV: 86.1 fL (ref 78.0–100.0)
MONO ABS: 0.6 10*3/uL (ref 0.1–1.0)
Monocytes Relative: 6 %
Neutro Abs: 7.7 10*3/uL (ref 1.7–7.7)
Neutrophils Relative %: 73 %
PLATELETS: 168 10*3/uL (ref 150–400)
RBC: 5.81 MIL/uL — AB (ref 3.87–5.11)
RDW: 15.5 % (ref 11.5–15.5)
WBC: 10.6 10*3/uL — AB (ref 4.0–10.5)

## 2017-01-25 LAB — URINALYSIS, ROUTINE W REFLEX MICROSCOPIC
BILIRUBIN URINE: NEGATIVE
GLUCOSE, UA: NEGATIVE mg/dL
Hgb urine dipstick: NEGATIVE
KETONES UR: NEGATIVE mg/dL
LEUKOCYTES UA: NEGATIVE
Nitrite: POSITIVE — AB
PH: 5 (ref 5.0–8.0)
Protein, ur: NEGATIVE mg/dL
SPECIFIC GRAVITY, URINE: 1.025 (ref 1.005–1.030)

## 2017-01-25 LAB — BASIC METABOLIC PANEL
ANION GAP: 10 (ref 5–15)
BUN: 12 mg/dL (ref 6–20)
CALCIUM: 9.3 mg/dL (ref 8.9–10.3)
CO2: 24 mmol/L (ref 22–32)
Chloride: 102 mmol/L (ref 101–111)
Creatinine, Ser: 0.58 mg/dL (ref 0.44–1.00)
GFR calc non Af Amer: >60 mL/min (ref >60)
Glucose, Bld: 131 mg/dL — ABNORMAL HIGH (ref 65–99)
POTASSIUM: 4.2 mmol/L (ref 3.5–5.1)
SODIUM: 136 mmol/L (ref 135–145)

## 2017-01-25 MED ORDER — DILTIAZEM HCL 30 MG PO TABS
30.0000 mg | ORAL_TABLET | Freq: Once | ORAL | Status: AC
  Administered 2017-01-25: 30 mg via ORAL
  Filled 2017-01-25: qty 1

## 2017-01-25 MED ORDER — IBUPROFEN 400 MG PO TABS
600.0000 mg | ORAL_TABLET | Freq: Once | ORAL | Status: AC
  Administered 2017-01-25: 23:00:00 600 mg via ORAL
  Filled 2017-01-25: qty 1

## 2017-01-25 MED ORDER — DEXTROSE 5 % IV SOLN
1.0000 g | Freq: Once | INTRAVENOUS | Status: AC
  Administered 2017-01-26: 1 g via INTRAVENOUS
  Filled 2017-01-25: qty 10

## 2017-01-25 MED ORDER — SODIUM CHLORIDE 0.9 % IV BOLUS (SEPSIS)
1000.0000 mL | Freq: Once | INTRAVENOUS | Status: AC
  Administered 2017-01-25: 1000 mL via INTRAVENOUS

## 2017-01-25 MED ORDER — CEPHALEXIN 500 MG PO CAPS: 500.0000 mg | ORAL_CAPSULE | Freq: Three times a day (TID) | ORAL | 0 refills | Status: DC

## 2017-01-25 MED ORDER — ONDANSETRON HCL 4 MG/2ML IJ SOLN
4.0000 mg | Freq: Once | INTRAMUSCULAR | Status: AC
  Administered 2017-01-25: 4 mg via INTRAVENOUS
  Filled 2017-01-25: qty 2

## 2017-01-25 NOTE — ED Notes (Signed)
Alison Doyle [phone]

## 2017-01-25 NOTE — ED Triage Notes (Signed)
Pt presents from [facility] for a witness fall. Pt is wheel chair bound and was trying to get out of bed and fell. C/o head and knee pain. Pt is on 81 asa daily. Does have history of dementia but A+Ox4.

## 2017-01-25 NOTE — ED Provider Notes (Signed)
[facility] EMERGENCY DEPARTMENT Provider Note   CSN: [id number] Arrival date & time: 01/25/17  1810     History   Chief Complaint Chief Complaint  Patient presents with  . Fall    HPI Alison Doyle is a 81 y.o. female.  HPI  81 year old female with past medical history as below including dementia, A. fib, history of sepsis, who presents with fall.  The patient reportedly was resting today.  She states that she was leaning forward in her wheelchair when she lost control and fell forward.  She states that she struck her head.  Since then, she has had a headache, nausea, as well as bilateral knee pain.  She also reports left chest pain that is worse with inspiration.  She is not sure if she lost consciousness.  She is on aspirin but denies any other blood thinner use.  She states she was well prior to the fall today.  Denies any abdominal pain or flank pain.  No urinary symptoms.  No other medical complaints.  Past Medical History:  Diagnosis Date  . Diabetes mellitus     Patient Active Problem List   Diagnosis Date Noted  . Coagulase negative Staphylococcus bacteremia 12/30/2016  . Subarachnoid hemorrhage 12/28/2016  . Acute pain of right shoulder   . Shoulder pain, left   . Epidural hematoma (HCC)   . Cerebrovascular accident (CVA) due to embolism of right middle cerebral artery (HCC)   . Paroxysmal atrial fibrillation (HCC)   . Diabetes mellitus (HCC)   . Sepsis (HCC) 12/26/2016  . Toxic encephalopathy     Past Surgical History:  Procedure Laterality Date  . REPLACEMENT TOTAL KNEE Right 2001    OB History    No data available       Home Medications    Prior to Admission medications   Medication Sig Start Date End Date Taking? Authorizing Provider  acetaminophen (TYLENOL) 500 MG tablet Take 1 tablet (500 mg total) every 6 (six) hours as needed by mouth for mild pain, moderate pain or headache. 01/04/17   Elease Etienne, MD  aspirin EC 81  MG tablet Take 81 mg by mouth daily.    [provider]  ciprofloxacin (CIPRO) 500 MG tablet Take 1 tablet (500 mg total) by mouth 2 (two) times daily for 5 days. 01/26/17 01/31/17  Shaune Pollack, MD  diltiazem (CARDIZEM CD) 180 MG 24 hr capsule Take 1 capsule (180 mg total) daily by mouth. 01/05/17   Hongalgi, Maximino Greenland, MD  insulin aspart (NOVOLOG) 100 UNIT/ML injection Inject 0-9 Units 3 (three) times daily with meals into the skin. CBG < 70: implement hypoglycemia protocol CBG 70 - 120: 0 units CBG 121 - 150: 1 unit CBG 151 - 200: 2 units CBG 201 - 250: 3 units CBG 251 - 300: 5 units CBG 301 - 350: 7 units CBG 351 - 400: 9 units CBG > 400: call MD. 01/04/17   Elease Etienne, MD  insulin glargine (LANTUS) 100 UNIT/ML injection Inject 0.1 mLs (10 Units total) daily into the skin. 01/05/17   Hongalgi, Maximino Greenland, MD  Maltodextrin-Xanthan Gum (RESOURCE THICKENUP CLEAR) POWD Oral, As needed, other 01/04/17   Elease Etienne, MD  metFORMIN (GLUCOPHAGE) 850 MG tablet Take 850 mg by mouth 2 (two) times daily with a meal.    [provider]  nicotine (NICODERM CQ - DOSED IN MG/24 HOURS) 21 mg/24hr patch Place 1 patch (21 mg total) daily onto the skin.  01/05/17   Hongalgi, Maximino GreenlandAnand D, MD  pravastatin (PRAVACHOL) 80 MG tablet Take 1 tablet (80 mg total) at bedtime by mouth. 01/04/17   Hongalgi, Maximino GreenlandAnand D, MD  traMADol (ULTRAM) 50 MG tablet Take 1 tablet (50 mg total) every 6 (six) hours as needed by mouth for severe pain. 01/04/17   Elease EtienneHongalgi, Anand D, MD    Family History No family history on file.  Social History Social History   Tobacco Use  . Smoking status: Current Every Day Smoker    Packs/day: 1.00    Types: Cigarettes  . Smokeless tobacco: Current User  Substance Use Topics  . Alcohol use: No  . Drug use: No     Allergies   Patient has no known allergies.   Review of Systems Review of Systems  Constitutional: Positive for fatigue. Negative for chills and  fever.  HENT: Negative for congestion and rhinorrhea.   Eyes: Negative for visual disturbance.  Respiratory: Negative for cough, shortness of breath and wheezing.   Cardiovascular: Positive for chest pain (Left-sided, worse with inspiration). Negative for leg swelling.  Gastrointestinal: Negative for abdominal pain, diarrhea, nausea and vomiting.  Genitourinary: Negative for dysuria and flank pain.  Musculoskeletal: Positive for arthralgias and myalgias. Negative for neck pain and neck stiffness.  Skin: Negative for rash and wound.  Allergic/Immunologic: Negative for immunocompromised state.  Neurological: Positive for syncope (Possible). Negative for weakness and headaches.  All other systems reviewed and are negative.    Physical Exam Updated Vital Signs BP 115/86   Pulse (!) 46   Temp (S) 97.8 F (36.6 C) (Rectal)   Resp (!) 21   Ht 5\' 8"  (1.727 m)   Wt 106.6 kg (235 lb)   SpO2 98%   BMI 35.73 kg/m   Physical Exam  Constitutional: She is oriented to person, place, and time. She appears well-developed and well-nourished. No distress.  HENT:  Head: Normocephalic and atraumatic.  Small hematoma to left forehead.  No palpable crepitance or step-offs.  Eyes: Conjunctivae are normal.  Neck: Neck supple.  No midline tenderness  Cardiovascular: Normal rate, regular rhythm and normal heart sounds. Exam reveals no friction rub.  No murmur heard. Pulmonary/Chest: Effort normal and breath sounds normal. No respiratory distress. She has no wheezes. She has no rales. She exhibits tenderness (Mild left-sided chest wall tenderness, worse with inspiration and palpation).  Abdominal: Soft. She exhibits no distension.  Musculoskeletal: She exhibits no edema.  Tenderness to palpation of the bilateral anterior knees, without obvious bruising or deformity  Neurological: She is alert and oriented to person, place, and time. She exhibits normal muscle tone.  Skin: Skin is warm. Capillary refill  takes less than 2 seconds.  Psychiatric: She has a normal mood and affect.  Nursing note and vitals reviewed.    ED Treatments / Results  Labs (all labs ordered are listed, but only abnormal results are displayed) Labs Reviewed  CBC WITH DIFFERENTIAL/PLATELET - Abnormal; Notable for the following components:      Result Value   WBC 10.6 (*)    RBC 5.81 (*)    Hemoglobin 16.8 (*)    HCT 50.0 (*)    All other components within normal limits  BASIC METABOLIC PANEL - Abnormal; Notable for the following components:   Glucose, Bld 131 (*)    All other components within normal limits  URINALYSIS, ROUTINE W REFLEX MICROSCOPIC - Abnormal; Notable for the following components:   Color, Urine AMBER (*)    APPearance HAZY (*)  Nitrite POSITIVE (*)    Bacteria, UA MANY (*)    Squamous Epithelial / LPF 0-5 (*)    All other components within normal limits  URINE CULTURE    EKG  EKG Interpretation  Date/Time:  Thursday January 25 2017 18:15:31 EST Ventricular Rate:  81 PR Interval:    QRS Duration: 97 QT Interval:  366 QTC Calculation: 425 R Axis:   64 Text Interpretation:  Atrial fibrillation Since last tracing, rate has decreased Confirmed by Shaune Pollack [phone]) on 01/25/2017 6:25:24 PM       Radiology Dg Chest 2 View  Result Date: 01/25/2017 CLINICAL DATA:  Recent fall with chest pain, initial encounter EXAM: CHEST  2 VIEW COMPARISON:  12/28/2016 FINDINGS: Cardiac shadow is mildly enlarged but stable. Aortic calcifications are again seen. The lungs are well aerated bilaterally. No focal infiltrate or sizable effusion is seen. Degenerative changes of the left shoulder joint are noted. Healing rib fractures are noted on the right but incompletely evaluated on this exam. No other bony abnormality is seen. IMPRESSION: Healing right rib fractures. No acute abnormality noted. Electronically Signed   By: Alcide Clever M.D.   On: 01/25/2017 19:33   Ct Head Wo Contrast  Result  Date: 01/25/2017 CLINICAL DATA:  Recent fall with headaches and neck pain EXAM: CT HEAD WITHOUT CONTRAST CT CERVICAL SPINE WITHOUT CONTRAST TECHNIQUE: Multidetector CT imaging of the head and cervical spine was performed following the standard protocol without intravenous contrast. Multiplanar CT image reconstructions of the cervical spine were also generated. COMPARISON:  12/27/2016 FINDINGS: CT HEAD FINDINGS Brain: There is a large geographic area decreased attenuation which corresponds to the previously seen right posterior MCA infarct. Mild atrophic changes are noted. No new focal infarct is seen. Previously seen epidural hematoma on the right along the temporal lobe is decreased in attenuation consistent with a chronic hematoma. The overall appearance has also decreased in size now measuring 8 mm in thickness decreased from 10 on the prior exam. Vascular: No hyperdense vessel or unexpected calcification. Skull: No acute fracture is noted. The previously seen temporal bone fracture is again identified and stable. Sinuses/Orbits: No acute finding. Other: None. CT CERVICAL SPINE FINDINGS Alignment: Within normal limits Skull base and vertebrae: 7 cervical segments are well visualized. Vertebral body height is well maintained. Osteophytic changes are noted at C4-5, C5-6 and C6-7. Mild disc space narrowing is noted at C5-6 and C6-7. Facet hypertrophic changes are noted without evidence of acute fracture or acute facet abnormality. Soft tissues and spinal canal: Within normal limits. Upper chest: No acute abnormality noted. Other: None IMPRESSION: CT of the head: Stable appearance of posterior right MCA infarct similar to that seen on prior exam. Stable right temporal bone fracture. Previously seen right epidural hematoma has decreased in attenuation consistent with a chronic hematoma. The thickness has also decreased in size now measuring 8 mm. No new hemorrhage or infarct is seen. Chronic atrophic changes are  noted. CT of the cervical spine: Multilevel degenerative change stable from the prior exam. No acute abnormality is noted. Electronically Signed   By: Alcide Clever M.D.   On: 01/25/2017 20:10   Ct Cervical Spine Wo Contrast  Result Date: 01/25/2017 CLINICAL DATA:  Recent fall with headaches and neck pain EXAM: CT HEAD WITHOUT CONTRAST CT CERVICAL SPINE WITHOUT CONTRAST TECHNIQUE: Multidetector CT imaging of the head and cervical spine was performed following the standard protocol without intravenous contrast. Multiplanar CT image reconstructions of the cervical spine were also generated.  COMPARISON:  12/27/2016 FINDINGS: CT HEAD FINDINGS Brain: There is a large geographic area decreased attenuation which corresponds to the previously seen right posterior MCA infarct. Mild atrophic changes are noted. No new focal infarct is seen. Previously seen epidural hematoma on the right along the temporal lobe is decreased in attenuation consistent with a chronic hematoma. The overall appearance has also decreased in size now measuring 8 mm in thickness decreased from 10 on the prior exam. Vascular: No hyperdense vessel or unexpected calcification. Skull: No acute fracture is noted. The previously seen temporal bone fracture is again identified and stable. Sinuses/Orbits: No acute finding. Other: None. CT CERVICAL SPINE FINDINGS Alignment: Within normal limits Skull base and vertebrae: 7 cervical segments are well visualized. Vertebral body height is well maintained. Osteophytic changes are noted at C4-5, C5-6 and C6-7. Mild disc space narrowing is noted at C5-6 and C6-7. Facet hypertrophic changes are noted without evidence of acute fracture or acute facet abnormality. Soft tissues and spinal canal: Within normal limits. Upper chest: No acute abnormality noted. Other: None IMPRESSION: CT of the head: Stable appearance of posterior right MCA infarct similar to that seen on prior exam. Stable right temporal bone fracture.  Previously seen right epidural hematoma has decreased in attenuation consistent with a chronic hematoma. The thickness has also decreased in size now measuring 8 mm. No new hemorrhage or infarct is seen. Chronic atrophic changes are noted. CT of the cervical spine: Multilevel degenerative change stable from the prior exam. No acute abnormality is noted. Electronically Signed   By: Alcide CleverMark  Lukens M.D.   On: 01/25/2017 20:10   Dg Knee Complete 4 Views Left  Result Date: 01/25/2017 CLINICAL DATA:  Left knee pain following fall, initial encounter EXAM: LEFT KNEE - COMPLETE 4+ VIEW COMPARISON:  11/29/2014 FINDINGS: Considerable degenerative changes are noted more marked than that seen on the prior exam. No acute fracture or dislocation is noted. No soft tissue abnormality is seen. Sclerotic focus is noted in the proximal tibial metaphysis stable from the prior exam likely representing an enchondroma. No other focal abnormality is seen. IMPRESSION: Degenerative change without acute abnormality. The changes have progressed in the interval from the prior study. Electronically Signed   By: Alcide CleverMark  Lukens M.D.   On: 01/25/2017 19:35   Dg Knee Complete 4 Views Right  Result Date: 01/25/2017 CLINICAL DATA:  Bilateral knee pain following fall, initial encounter EXAM: RIGHT KNEE - COMPLETE 4+ VIEW COMPARISON:  11/29/2014 FINDINGS: Right knee replacement is noted. No loosening is seen. No acute fracture or dislocation is noted. No soft tissue abnormality is noted. IMPRESSION: Postsurgical changes without acute abnormality. Electronically Signed   By: Alcide CleverMark  Lukens M.D.   On: 01/25/2017 19:34    Procedures Procedures (including critical care time)  Medications Ordered in ED Medications  ondansetron (ZOFRAN) injection 4 mg (4 mg Intravenous Given 01/25/17 2142)  sodium chloride 0.9 % bolus 1,000 mL (0 mLs Intravenous Stopped 01/25/17 2304)  ibuprofen (ADVIL,MOTRIN) tablet 600 mg (600 mg Oral Given 01/25/17 2238)    diltiazem (CARDIZEM) tablet 30 mg (30 mg Oral Given 01/25/17 2238)  cefTRIAXone (ROCEPHIN) 1 g in dextrose 5 % 50 mL IVPB (0 g Intravenous Stopped 01/26/17 0030)     Initial Impression / Assessment and Plan / ED Course  I have reviewed the triage vital signs and the nursing notes.  Pertinent labs & imaging results that were available during my care of the patient were reviewed by me and considered in my medical decision  making (see chart for details).     81 year old female with past medical history as above here with likely mechanical fall out of her wheelchair.  Patient has history of the same.  She is hemodynamically stable here.  She is in atrial fibrillation which is known.  She missed her dose of medication so she was given a one-time dose and she is rate controlled here.  Otherwise, her lab work and imaging is largely unremarkable.  She appears to be mildly hemoconcentrated and has been given fluids.  She has positive nitrites but no significant pyuria.  She denies any urinary symptoms.  However, given positive nitrites and bacteria in urine, will treat. Cipro given based on prior sensitivities, +staph on last admission that seemed sensitive to cipro. She has no signs of sepsis or systemic infection.  She is at her mental baseline.  Will discharge her back to her facility with a course of Cipro and fall precautions.  Final Clinical Impressions(s) / ED Diagnoses   Final diagnoses:  Contusion of face, initial encounter  Fall, initial encounter  Acute cystitis without hematuria    ED Discharge Orders        Ordered    ciprofloxacin (CIPRO) 500 MG tablet  2 times daily     01/26/17 0016    cephALEXin (KEFLEX) 500 MG capsule  3 times daily,   Status:  Discontinued     01/25/17 2341       Shaune Pollack, MD 01/26/17 0136

## 2017-01-26 DIAGNOSIS — W19XXXD Unspecified fall, subsequent encounter: Secondary | ICD-10-CM | POA: Diagnosis not present

## 2017-01-26 DIAGNOSIS — G8911 Acute pain due to trauma: Secondary | ICD-10-CM | POA: Diagnosis not present

## 2017-01-26 DIAGNOSIS — S065X0D Traumatic subdural hemorrhage without loss of consciousness, subsequent encounter: Secondary | ICD-10-CM | POA: Diagnosis not present

## 2017-01-26 DIAGNOSIS — S0083XD Contusion of other part of head, subsequent encounter: Secondary | ICD-10-CM | POA: Diagnosis not present

## 2017-01-26 DIAGNOSIS — I63411 Cerebral infarction due to embolism of right middle cerebral artery: Secondary | ICD-10-CM | POA: Diagnosis not present

## 2017-01-26 DIAGNOSIS — N390 Urinary tract infection, site not specified: Secondary | ICD-10-CM | POA: Diagnosis not present

## 2017-01-26 MED ORDER — CIPROFLOXACIN HCL 500 MG PO TABS: 500.0000 mg | ORAL_TABLET | Freq: Two times a day (BID) | ORAL | 0 refills | Status: AC

## 2017-01-28 LAB — URINE CULTURE

## 2017-01-29 ENCOUNTER — Telehealth: Payer: Self-pay | Admitting: Emergency Medicine

## 2017-01-29 ENCOUNTER — Telehealth: Payer: Self-pay

## 2017-01-29 NOTE — Telephone Encounter (Signed)
Post ED Visit - Positive Culture Follow-up  Culture report reviewed by antimicrobial stewardship pharmacist:  []  Alison Doyle, Pharm.D. []  Alison Doyle, Pharm.D., BCPS AQ-ID []  Alison Doyle, Pharm.D., BCPS []  Alison Doyle, Pharm.D., BCPS []  Alison Doyle, [address]Pharm.D., BCPS, AAHIVP []  Alison Doyle, Pharm.D., BCPS, AAHIVP []  Alison Doyle, PharmD, BCPS []  Alison Doyle, PharmD, BCPS []  Alison Doyle, PharmD, BCPS  Positive urine culture Treated with cephalexin and ciprofloxacin, organism sensitive to the same and no further patient follow-up is required at this time.  Alison Doyle, Alison Doyle 01/29/2017, 10:52 AM

## 2017-01-29 NOTE — Telephone Encounter (Signed)
Rn call [place] at [facility] at [phone]. Rn stated per Dr. Roda ShuttersXu pt had a Ct scan last week,and does not need another one. PT fell and was in the ED and it was order. Pt has Ct schedule on 01/30/2017.[place]ta stated [facility]. Rn also stated per Dr .Roda ShuttersXu she can restart eliquis because of the CT scan results. Sudan[place]ta wanted a order fax stating she can restart eliquis. Rn stated the office is closed today, and tomorrow. Rn stated facility will be call if Dr.Xu or the medical Md should start the eliquis. Rn stated a call will be made on Wednesday to follow up. Sudan[place]berta verbalized understanding.

## 2017-01-30 ENCOUNTER — Other Ambulatory Visit: Payer: Medicare Other

## 2017-01-31 ENCOUNTER — Encounter: Payer: Self-pay | Admitting: Neurology

## 2017-01-31 NOTE — Telephone Encounter (Signed)
Letter prepared and sent. Thanks.   Marvel PlanJindong Khadejah Son, MD PhD Stroke Neurology 01/31/2017 5:37 PM

## 2017-01-31 NOTE — Telephone Encounter (Signed)
Rn call [facility] to get a order form. Rn was transferred 3 times and on hold for 10 minutes. Rn was told "im not the nurse for pt". RN explain that she was trying to reach the 700 hallway for pts nurse.  Rn finally spoke to [place] Rn. Rn requested a order form to be fax from the nursing home to start eliquis for the pt. [place] stated she was not the resident nurse. Rn explain per. Dr. Roda ShuttersXu he can write a order for eliquis but a order form is needed from the facility. Rn gave fax number of [phone] for order form to be fax. [place] stated she will forward to pts nurse.

## 2017-01-31 NOTE — Telephone Encounter (Signed)
Letter fax to [facility] from Dr .Roda ShuttersXu to start patient on elquis 5mg  bid. Letter fax twice and receive.

## 2017-01-31 NOTE — Telephone Encounter (Signed)
Rn call [facility] to follow up about order form to put orders to resume pts eliquis. Rn stated a call was made at 1220pm. Rn stated a order form was to be fax from the facility by Courtney HeysJudy Junkins nurse. Rn was on hold again for over 8 minutes. Finally Elease HashimotoPatricia nurse for pt got on the phone. Rn explain a call was made earlier about restarting eliquis. Rn stated [facility] fax number of [phone] was given so Dr. Roda ShuttersXu can fill out, and write order for eliquis. [name] stated they a yellow physicians sheet that he can fill out. RN gave fax number so form can be fax again to [phone].

## 2017-01-31 NOTE — Telephone Encounter (Signed)
Rn receive fax from NoviPatricia at [facility]. They did not fax a order form, or physicians form.[name] only fax a sheet of paper stating to please write the order your office letter head, and fax to [facility] to [phone]. Dr. Roda Doyle was made aware of this. He is trying to resume eliquis for pt so the nursing home MD can be notified.

## 2017-01-31 NOTE — Telephone Encounter (Signed)
Dr. Roda ShuttersXu recommend the medical team start her eliquis 5mg  bid. PT has appt in January 2019 with Dr. Marjory LiesPenumalli.

## 2017-02-01 NOTE — Telephone Encounter (Signed)
Rn spoke to Psychologist, clinicaloberta supervisor over the nurses. Rn explain that several phone calls have been made to her facility in trying to speak with somebody about eliquis. Rn stated a fax was sent last night, and this am no one could not located it. Rn stated the frustrated of making multiple phone calls, and getting transfer on different halls. Rn stated the phone calls were important for the pt, and the eliquis is important to avoid a another stroke. Alison Doyle apologize and states she is sorry for the issues I was having. Alison Doyle had the fax from Dr. Roda ShuttersXu concerning discontinuing aspirin, and starting eliquis. She will give to PA at the facility.

## 2017-02-01 NOTE — Telephone Encounter (Signed)
Rn call [facility] to see if they receive the fax order to start eliquis for patient. Rn stated it was fax on 01/31/2017 after 5pm twice and it was confirmed. Rn spoke with Darel HongJudy nurse and she stated they [facility]. Rn stated it was fax twice to [phone]. The nurse gave fax number again of [phone].

## 2017-02-05 DIAGNOSIS — M25562 Pain in left knee: Secondary | ICD-10-CM | POA: Diagnosis not present

## 2017-02-05 DIAGNOSIS — I63411 Cerebral infarction due to embolism of right middle cerebral artery: Secondary | ICD-10-CM | POA: Diagnosis not present

## 2017-02-05 DIAGNOSIS — I4891 Unspecified atrial fibrillation: Secondary | ICD-10-CM | POA: Diagnosis not present

## 2017-02-05 DIAGNOSIS — S065X0D Traumatic subdural hemorrhage without loss of consciousness, subsequent encounter: Secondary | ICD-10-CM | POA: Diagnosis not present

## 2017-02-07 DIAGNOSIS — N390 Urinary tract infection, site not specified: Secondary | ICD-10-CM | POA: Diagnosis not present

## 2017-02-25 DIAGNOSIS — R05 Cough: Secondary | ICD-10-CM | POA: Diagnosis not present

## 2017-03-07 DIAGNOSIS — I4891 Unspecified atrial fibrillation: Secondary | ICD-10-CM | POA: Diagnosis not present

## 2017-03-07 DIAGNOSIS — E7800 Pure hypercholesterolemia, unspecified: Secondary | ICD-10-CM | POA: Diagnosis not present

## 2017-03-07 DIAGNOSIS — I69359 Hemiplegia and hemiparesis following cerebral infarction affecting unspecified side: Secondary | ICD-10-CM | POA: Diagnosis not present

## 2017-03-07 DIAGNOSIS — I10 Essential (primary) hypertension: Secondary | ICD-10-CM | POA: Diagnosis not present

## 2017-03-12 ENCOUNTER — Ambulatory Visit: Payer: Medicare Other | Admitting: Diagnostic Neuroimaging

## 2017-03-13 ENCOUNTER — Encounter: Payer: Self-pay | Admitting: Diagnostic Neuroimaging

## 2017-03-14 DIAGNOSIS — L89620 Pressure ulcer of left heel, unstageable: Secondary | ICD-10-CM | POA: Diagnosis not present

## 2017-03-21 DIAGNOSIS — L89620 Pressure ulcer of left heel, unstageable: Secondary | ICD-10-CM | POA: Diagnosis not present

## 2017-03-28 DIAGNOSIS — I4891 Unspecified atrial fibrillation: Secondary | ICD-10-CM | POA: Diagnosis not present

## 2017-03-28 DIAGNOSIS — S065X0D Traumatic subdural hemorrhage without loss of consciousness, subsequent encounter: Secondary | ICD-10-CM | POA: Diagnosis not present

## 2017-03-28 DIAGNOSIS — L89620 Pressure ulcer of left heel, unstageable: Secondary | ICD-10-CM | POA: Diagnosis not present

## 2017-03-28 DIAGNOSIS — R05 Cough: Secondary | ICD-10-CM | POA: Diagnosis not present

## 2017-03-28 DIAGNOSIS — I63411 Cerebral infarction due to embolism of right middle cerebral artery: Secondary | ICD-10-CM | POA: Diagnosis not present

## 2017-04-02 DIAGNOSIS — E785 Hyperlipidemia, unspecified: Secondary | ICD-10-CM | POA: Diagnosis not present

## 2017-04-02 DIAGNOSIS — N390 Urinary tract infection, site not specified: Secondary | ICD-10-CM | POA: Diagnosis not present

## 2017-04-02 DIAGNOSIS — I10 Essential (primary) hypertension: Secondary | ICD-10-CM | POA: Diagnosis not present

## 2017-04-02 DIAGNOSIS — R319 Hematuria, unspecified: Secondary | ICD-10-CM | POA: Diagnosis not present

## 2017-04-04 DIAGNOSIS — L89620 Pressure ulcer of left heel, unstageable: Secondary | ICD-10-CM | POA: Diagnosis not present

## 2017-04-05 DIAGNOSIS — N390 Urinary tract infection, site not specified: Secondary | ICD-10-CM | POA: Diagnosis not present

## 2017-04-05 DIAGNOSIS — E785 Hyperlipidemia, unspecified: Secondary | ICD-10-CM | POA: Diagnosis not present

## 2017-04-05 DIAGNOSIS — I10 Essential (primary) hypertension: Secondary | ICD-10-CM | POA: Diagnosis not present

## 2017-04-05 DIAGNOSIS — Z79899 Other long term (current) drug therapy: Secondary | ICD-10-CM | POA: Diagnosis not present

## 2017-04-05 DIAGNOSIS — D649 Anemia, unspecified: Secondary | ICD-10-CM | POA: Diagnosis not present

## 2017-04-06 DIAGNOSIS — I63411 Cerebral infarction due to embolism of right middle cerebral artery: Secondary | ICD-10-CM | POA: Diagnosis not present

## 2017-04-06 DIAGNOSIS — E119 Type 2 diabetes mellitus without complications: Secondary | ICD-10-CM | POA: Diagnosis not present

## 2017-04-06 DIAGNOSIS — I4891 Unspecified atrial fibrillation: Secondary | ICD-10-CM | POA: Diagnosis not present

## 2017-04-06 DIAGNOSIS — R52 Pain, unspecified: Secondary | ICD-10-CM | POA: Diagnosis not present

## 2017-04-06 DIAGNOSIS — N390 Urinary tract infection, site not specified: Secondary | ICD-10-CM | POA: Diagnosis not present

## 2017-04-11 DIAGNOSIS — L89620 Pressure ulcer of left heel, unstageable: Secondary | ICD-10-CM | POA: Diagnosis not present

## 2017-04-11 DIAGNOSIS — L89150 Pressure ulcer of sacral region, unstageable: Secondary | ICD-10-CM | POA: Diagnosis not present

## 2017-04-18 DIAGNOSIS — L89620 Pressure ulcer of left heel, unstageable: Secondary | ICD-10-CM | POA: Diagnosis not present

## 2017-04-18 DIAGNOSIS — L89150 Pressure ulcer of sacral region, unstageable: Secondary | ICD-10-CM | POA: Diagnosis not present

## 2017-04-25 DIAGNOSIS — I69359 Hemiplegia and hemiparesis following cerebral infarction affecting unspecified side: Secondary | ICD-10-CM | POA: Diagnosis not present

## 2017-04-25 DIAGNOSIS — L89150 Pressure ulcer of sacral region, unstageable: Secondary | ICD-10-CM | POA: Diagnosis not present

## 2017-04-25 DIAGNOSIS — I4891 Unspecified atrial fibrillation: Secondary | ICD-10-CM | POA: Diagnosis not present

## 2017-04-25 DIAGNOSIS — E119 Type 2 diabetes mellitus without complications: Secondary | ICD-10-CM | POA: Diagnosis not present

## 2017-04-25 DIAGNOSIS — L89620 Pressure ulcer of left heel, unstageable: Secondary | ICD-10-CM | POA: Diagnosis not present

## 2017-04-25 DIAGNOSIS — I10 Essential (primary) hypertension: Secondary | ICD-10-CM | POA: Diagnosis not present

## 2017-05-02 DIAGNOSIS — L89620 Pressure ulcer of left heel, unstageable: Secondary | ICD-10-CM | POA: Diagnosis not present

## 2017-05-02 DIAGNOSIS — L89150 Pressure ulcer of sacral region, unstageable: Secondary | ICD-10-CM | POA: Diagnosis not present

## 2017-05-09 DIAGNOSIS — L89150 Pressure ulcer of sacral region, unstageable: Secondary | ICD-10-CM | POA: Diagnosis not present

## 2017-05-09 DIAGNOSIS — L89620 Pressure ulcer of left heel, unstageable: Secondary | ICD-10-CM | POA: Diagnosis not present

## 2017-05-16 DIAGNOSIS — L89620 Pressure ulcer of left heel, unstageable: Secondary | ICD-10-CM | POA: Diagnosis not present

## 2017-05-16 DIAGNOSIS — L89150 Pressure ulcer of sacral region, unstageable: Secondary | ICD-10-CM | POA: Diagnosis not present

## 2017-05-22 DIAGNOSIS — R112 Nausea with vomiting, unspecified: Secondary | ICD-10-CM | POA: Diagnosis not present

## 2017-05-22 DIAGNOSIS — I63411 Cerebral infarction due to embolism of right middle cerebral artery: Secondary | ICD-10-CM | POA: Diagnosis not present

## 2017-05-22 DIAGNOSIS — B372 Candidiasis of skin and nail: Secondary | ICD-10-CM | POA: Diagnosis not present

## 2017-05-22 DIAGNOSIS — I4891 Unspecified atrial fibrillation: Secondary | ICD-10-CM | POA: Diagnosis not present

## 2017-05-23 DIAGNOSIS — L89620 Pressure ulcer of left heel, unstageable: Secondary | ICD-10-CM | POA: Diagnosis not present

## 2017-05-23 DIAGNOSIS — L89150 Pressure ulcer of sacral region, unstageable: Secondary | ICD-10-CM | POA: Diagnosis not present

## 2017-05-24 DIAGNOSIS — E559 Vitamin D deficiency, unspecified: Secondary | ICD-10-CM | POA: Diagnosis not present

## 2017-05-31 DIAGNOSIS — L89150 Pressure ulcer of sacral region, unstageable: Secondary | ICD-10-CM | POA: Diagnosis not present

## 2017-05-31 DIAGNOSIS — L89620 Pressure ulcer of left heel, unstageable: Secondary | ICD-10-CM | POA: Diagnosis not present

## 2017-06-06 DIAGNOSIS — L89150 Pressure ulcer of sacral region, unstageable: Secondary | ICD-10-CM | POA: Diagnosis not present

## 2017-06-06 DIAGNOSIS — L89620 Pressure ulcer of left heel, unstageable: Secondary | ICD-10-CM | POA: Diagnosis not present

## 2017-06-13 DIAGNOSIS — L89150 Pressure ulcer of sacral region, unstageable: Secondary | ICD-10-CM | POA: Diagnosis not present

## 2017-06-13 DIAGNOSIS — L89620 Pressure ulcer of left heel, unstageable: Secondary | ICD-10-CM | POA: Diagnosis not present

## 2017-06-18 ENCOUNTER — Other Ambulatory Visit: Payer: Self-pay

## 2017-06-18 ENCOUNTER — Encounter (HOSPITAL_COMMUNITY): Payer: Self-pay | Admitting: Internal Medicine

## 2017-06-18 ENCOUNTER — Emergency Department (HOSPITAL_COMMUNITY): Payer: Medicare Other

## 2017-06-18 ENCOUNTER — Inpatient Hospital Stay (HOSPITAL_COMMUNITY)
Admission: EM | Admit: 2017-06-18 | Discharge: 2017-06-21 | DRG: 871 | Disposition: A | Discharge status: Hospice | Payer: Medicare Other | Attending: Internal Medicine | Admitting: Internal Medicine

## 2017-06-18 DIAGNOSIS — Z22322 Carrier or suspected carrier of Methicillin resistant Staphylococcus aureus: Secondary | ICD-10-CM | POA: Diagnosis not present

## 2017-06-18 DIAGNOSIS — R069 Unspecified abnormalities of breathing: Secondary | ICD-10-CM | POA: Diagnosis not present

## 2017-06-18 DIAGNOSIS — E872 Acidosis: Secondary | ICD-10-CM | POA: Diagnosis not present

## 2017-06-18 DIAGNOSIS — L89323 Pressure ulcer of left buttock, stage 3: Secondary | ICD-10-CM | POA: Diagnosis not present

## 2017-06-18 DIAGNOSIS — Z7189 Other specified counseling: Secondary | ICD-10-CM

## 2017-06-18 DIAGNOSIS — R402332 Coma scale, best motor response, abnormal, at arrival to emergency department: Secondary | ICD-10-CM | POA: Diagnosis present

## 2017-06-18 DIAGNOSIS — I4891 Unspecified atrial fibrillation: Secondary | ICD-10-CM | POA: Insufficient documentation

## 2017-06-18 DIAGNOSIS — Z515 Encounter for palliative care: Secondary | ICD-10-CM | POA: Diagnosis not present

## 2017-06-18 DIAGNOSIS — E11649 Type 2 diabetes mellitus with hypoglycemia without coma: Secondary | ICD-10-CM | POA: Diagnosis present

## 2017-06-18 DIAGNOSIS — R402222 Coma scale, best verbal response, incomprehensible words, at arrival to emergency department: Secondary | ICD-10-CM | POA: Diagnosis not present

## 2017-06-18 DIAGNOSIS — R1319 Other dysphagia: Secondary | ICD-10-CM | POA: Diagnosis not present

## 2017-06-18 DIAGNOSIS — Z794 Long term (current) use of insulin: Secondary | ICD-10-CM

## 2017-06-18 DIAGNOSIS — E11622 Type 2 diabetes mellitus with other skin ulcer: Secondary | ICD-10-CM | POA: Diagnosis not present

## 2017-06-18 DIAGNOSIS — G9341 Metabolic encephalopathy: Secondary | ICD-10-CM | POA: Diagnosis present

## 2017-06-18 DIAGNOSIS — A411 Sepsis due to other specified staphylococcus: Principal | ICD-10-CM | POA: Diagnosis present

## 2017-06-18 DIAGNOSIS — Z7982 Long term (current) use of aspirin: Secondary | ICD-10-CM | POA: Diagnosis not present

## 2017-06-18 DIAGNOSIS — J9601 Acute respiratory failure with hypoxia: Secondary | ICD-10-CM | POA: Diagnosis present

## 2017-06-18 DIAGNOSIS — L8990 Pressure ulcer of unspecified site, unspecified stage: Secondary | ICD-10-CM | POA: Diagnosis present

## 2017-06-18 DIAGNOSIS — R6521 Severe sepsis with septic shock: Secondary | ICD-10-CM | POA: Diagnosis not present

## 2017-06-18 DIAGNOSIS — Z7401 Bed confinement status: Secondary | ICD-10-CM

## 2017-06-18 DIAGNOSIS — B9689 Other specified bacterial agents as the cause of diseases classified elsewhere: Secondary | ICD-10-CM | POA: Diagnosis not present

## 2017-06-18 DIAGNOSIS — I69354 Hemiplegia and hemiparesis following cerebral infarction affecting left non-dominant side: Secondary | ICD-10-CM

## 2017-06-18 DIAGNOSIS — L89303 Pressure ulcer of unspecified buttock, stage 3: Secondary | ICD-10-CM | POA: Diagnosis not present

## 2017-06-18 DIAGNOSIS — R402142 Coma scale, eyes open, spontaneous, at arrival to emergency department: Secondary | ICD-10-CM | POA: Diagnosis present

## 2017-06-18 DIAGNOSIS — R7881 Bacteremia: Secondary | ICD-10-CM | POA: Diagnosis not present

## 2017-06-18 DIAGNOSIS — G8929 Other chronic pain: Secondary | ICD-10-CM | POA: Diagnosis present

## 2017-06-18 DIAGNOSIS — J189 Pneumonia, unspecified organism: Secondary | ICD-10-CM | POA: Diagnosis present

## 2017-06-18 DIAGNOSIS — I480 Paroxysmal atrial fibrillation: Secondary | ICD-10-CM | POA: Diagnosis not present

## 2017-06-18 DIAGNOSIS — F0150 Vascular dementia without behavioral disturbance: Secondary | ICD-10-CM | POA: Diagnosis not present

## 2017-06-18 DIAGNOSIS — B370 Candidal stomatitis: Secondary | ICD-10-CM | POA: Diagnosis not present

## 2017-06-18 DIAGNOSIS — Z66 Do not resuscitate: Secondary | ICD-10-CM | POA: Diagnosis present

## 2017-06-18 DIAGNOSIS — L89620 Pressure ulcer of left heel, unstageable: Secondary | ICD-10-CM | POA: Diagnosis not present

## 2017-06-18 DIAGNOSIS — Z96651 Presence of right artificial knee joint: Secondary | ICD-10-CM | POA: Diagnosis not present

## 2017-06-18 DIAGNOSIS — F17210 Nicotine dependence, cigarettes, uncomplicated: Secondary | ICD-10-CM | POA: Diagnosis present

## 2017-06-18 DIAGNOSIS — G9340 Encephalopathy, unspecified: Secondary | ICD-10-CM | POA: Diagnosis not present

## 2017-06-18 DIAGNOSIS — I69328 Other speech and language deficits following cerebral infarction: Secondary | ICD-10-CM

## 2017-06-18 DIAGNOSIS — R0602 Shortness of breath: Secondary | ICD-10-CM | POA: Diagnosis not present

## 2017-06-18 DIAGNOSIS — J9600 Acute respiratory failure, unspecified whether with hypoxia or hypercapnia: Secondary | ICD-10-CM | POA: Diagnosis present

## 2017-06-18 DIAGNOSIS — R1310 Dysphagia, unspecified: Secondary | ICD-10-CM

## 2017-06-18 DIAGNOSIS — Y95 Nosocomial condition: Secondary | ICD-10-CM | POA: Diagnosis present

## 2017-06-18 DIAGNOSIS — A419 Sepsis, unspecified organism: Secondary | ICD-10-CM | POA: Diagnosis present

## 2017-06-18 DIAGNOSIS — E119 Type 2 diabetes mellitus without complications: Secondary | ICD-10-CM

## 2017-06-18 DIAGNOSIS — B957 Other staphylococcus as the cause of diseases classified elsewhere: Secondary | ICD-10-CM | POA: Diagnosis not present

## 2017-06-18 DIAGNOSIS — R509 Fever, unspecified: Secondary | ICD-10-CM | POA: Diagnosis not present

## 2017-06-18 HISTORY — DX: Acute respiratory failure, unspecified whether with hypoxia or hypercapnia: J96.00

## 2017-06-18 HISTORY — DX: Pneumonia, unspecified organism: J18.9

## 2017-06-18 HISTORY — DX: Epidural hemorrhage with loss of consciousness status unknown, initial encounter: S06.4XAA

## 2017-06-18 HISTORY — DX: Epidural hemorrhage with loss of consciousness of unspecified duration, initial encounter: S06.4X9A

## 2017-06-18 HISTORY — DX: Unspecified atrial fibrillation: I48.91

## 2017-06-18 HISTORY — DX: Sepsis, unspecified organism: A41.9

## 2017-06-18 HISTORY — DX: Cerebral infarction, unspecified: I63.9

## 2017-06-18 HISTORY — DX: Nontraumatic subarachnoid hemorrhage, unspecified: I60.9

## 2017-06-18 LAB — CBC WITH DIFFERENTIAL/PLATELET
BASOS ABS: 0 10*3/uL (ref 0.0–0.1)
BASOS PCT: 0 %
EOS ABS: 0 10*3/uL (ref 0.0–0.7)
Eosinophils Relative: 0 %
HCT: 52.4 % — ABNORMAL HIGH (ref 36.0–46.0)
HEMOGLOBIN: 17.1 g/dL — AB (ref 12.0–15.0)
LYMPHS ABS: 1.5 10*3/uL (ref 0.7–4.0)
Lymphocytes Relative: 7 %
MCH: 29.8 pg (ref 26.0–34.0)
MCHC: 32.6 g/dL (ref 30.0–36.0)
MCV: 91.4 fL (ref 78.0–100.0)
MONOS PCT: 4 %
Monocytes Absolute: 0.8 10*3/uL (ref 0.1–1.0)
Neutro Abs: 18.9 10*3/uL — ABNORMAL HIGH (ref 1.7–7.7)
Neutrophils Relative %: 89 %
Platelets: 348 10*3/uL (ref 150–400)
RBC: 5.73 MIL/uL — ABNORMAL HIGH (ref 3.87–5.11)
RDW: 15.7 % — ABNORMAL HIGH (ref 11.5–15.5)
Smear Review: ADEQUATE
WBC: 21.2 10*3/uL — ABNORMAL HIGH (ref 4.0–10.5)

## 2017-06-18 LAB — COMPREHENSIVE METABOLIC PANEL
ALK PHOS: 113 U/L (ref 38–126)
ALT: 18 U/L (ref 14–54)
AST: 22 U/L (ref 15–41)
Albumin: 2.1 g/dL — ABNORMAL LOW (ref 3.5–5.0)
Anion gap: 10 (ref 5–15)
BILIRUBIN TOTAL: 0.6 mg/dL (ref 0.3–1.2)
BUN: 20 mg/dL (ref 6–20)
CALCIUM: 8.3 mg/dL — AB (ref 8.9–10.3)
CO2: 23 mmol/L (ref 22–32)
Chloride: 103 mmol/L (ref 101–111)
Creatinine, Ser: 0.68 mg/dL (ref 0.44–1.00)
GFR calc Af Amer: >60 mL/min (ref >60)
GFR calc non Af Amer: >60 mL/min (ref >60)
Glucose, Bld: 208 mg/dL — ABNORMAL HIGH (ref 65–99)
Potassium: 4.6 mmol/L (ref 3.5–5.1)
Sodium: 136 mmol/L (ref 135–145)
TOTAL PROTEIN: 5.6 g/dL — AB (ref 6.5–8.1)

## 2017-06-18 LAB — TROPONIN I: Troponin I: <0.03 ng/mL (ref <0.03)

## 2017-06-18 LAB — LACTIC ACID, PLASMA
LACTIC ACID, VENOUS: 2 mmol/L — AB (ref 0.5–1.9)
Lactic Acid, Venous: 2.1 mmol/L (ref 0.5–1.9)

## 2017-06-18 LAB — TSH: TSH: 0.648 u[IU]/mL (ref 0.350–4.500)

## 2017-06-18 LAB — BRAIN NATRIURETIC PEPTIDE: B NATRIURETIC PEPTIDE 5: 286.1 pg/mL — AB (ref 0.0–100.0)

## 2017-06-18 LAB — I-STAT CG4 LACTIC ACID, ED: Lactic Acid, Venous: 4.47 mmol/L (ref 0.5–1.9)

## 2017-06-18 LAB — GLUCOSE, CAPILLARY: Glucose-Capillary: 146 mg/dL — ABNORMAL HIGH (ref 65–99)

## 2017-06-18 LAB — HEMOGLOBIN A1C
Hgb A1c MFr Bld: 5 % (ref 4.8–5.6)
Mean Plasma Glucose: 96.8 mg/dL

## 2017-06-18 MED ORDER — INSULIN GLARGINE 100 UNIT/ML ~~LOC~~ SOLN: 10.0000 [IU] | Freq: Every day | SUBCUTANEOUS | Status: DC

## 2017-06-18 MED ORDER — SCOPOLAMINE 1 MG/3DAYS TD PT72
1.0000 | MEDICATED_PATCH | TRANSDERMAL | Status: DC
  Administered 2017-06-18: 1.5 mg via TRANSDERMAL
  Filled 2017-06-18 (×2): qty 1

## 2017-06-18 MED ORDER — INSULIN ASPART 100 UNIT/ML ~~LOC~~ SOLN: 0.0000 [IU] | Freq: Three times a day (TID) | SUBCUTANEOUS | Status: DC

## 2017-06-18 MED ORDER — ASPIRIN EC 81 MG PO TBEC
81.0000 mg | DELAYED_RELEASE_TABLET | Freq: Every day | ORAL | Status: DC
  Administered 2017-06-19 – 2017-06-21 (×3): 81 mg via ORAL
  Filled 2017-06-18 (×3): qty 1

## 2017-06-18 MED ORDER — MORPHINE SULFATE (PF) 4 MG/ML IV SOLN
2.0000 mg | INTRAVENOUS | Status: DC | PRN
  Administered 2017-06-18 – 2017-06-20 (×2): 2 mg via INTRAVENOUS
  Filled 2017-06-18 (×2): qty 1

## 2017-06-18 MED ORDER — RESOURCE THICKENUP CLEAR PO POWD: ORAL | Status: DC | PRN

## 2017-06-18 MED ORDER — SODIUM CHLORIDE 0.9 % IV SOLN
1000.0000 mL | INTRAVENOUS | Status: DC
  Administered 2017-06-18: 1000 mL via INTRAVENOUS

## 2017-06-18 MED ORDER — DILTIAZEM HCL 25 MG/5ML IV SOLN
10.0000 mg | Freq: Four times a day (QID) | INTRAVENOUS | Status: DC | PRN
  Filled 2017-06-18: qty 5

## 2017-06-18 MED ORDER — DILTIAZEM HCL 25 MG/5ML IV SOLN
10.0000 mg | Freq: Once | INTRAVENOUS | Status: AC
  Administered 2017-06-18: 10 mg via INTRAVENOUS

## 2017-06-18 MED ORDER — SODIUM CHLORIDE 0.9 % IV BOLUS (SEPSIS)
1000.0000 mL | Freq: Once | INTRAVENOUS | Status: AC
  Administered 2017-06-18: 1000 mL via INTRAVENOUS

## 2017-06-18 MED ORDER — SODIUM CHLORIDE 0.9 % IV BOLUS (SEPSIS)
1000.0000 mL | Freq: Once | INTRAVENOUS | Status: AC
Start: 2017-06-18 — End: 2017-06-18
  Administered 2017-06-18: 1000 mL via INTRAVENOUS

## 2017-06-18 MED ORDER — SODIUM CHLORIDE 0.9 % IV SOLN
2.0000 g | Freq: Once | INTRAVENOUS | Status: AC
  Administered 2017-06-18: 2 g via INTRAVENOUS
  Filled 2017-06-18: qty 2

## 2017-06-18 MED ORDER — TRAMADOL HCL 50 MG PO TABS: 50.0000 mg | ORAL_TABLET | Freq: Four times a day (QID) | ORAL | Status: DC | PRN

## 2017-06-18 MED ORDER — VANCOMYCIN HCL IN DEXTROSE 1-5 GM/200ML-% IV SOLN
1000.0000 mg | Freq: Two times a day (BID) | INTRAVENOUS | Status: DC
  Administered 2017-06-18 – 2017-06-21 (×4): 1000 mg via INTRAVENOUS
  Filled 2017-06-18 (×6): qty 200

## 2017-06-18 MED ORDER — ONDANSETRON HCL 4 MG/2ML IJ SOLN
4.0000 mg | Freq: Four times a day (QID) | INTRAMUSCULAR | Status: DC | PRN
  Administered 2017-06-18 (×2): 4 mg via INTRAVENOUS
  Filled 2017-06-18: qty 2

## 2017-06-18 MED ORDER — VANCOMYCIN HCL IN DEXTROSE 1-5 GM/200ML-% IV SOLN
1000.0000 mg | Freq: Once | INTRAVENOUS | Status: AC
  Administered 2017-06-18: 1000 mg via INTRAVENOUS
  Filled 2017-06-18: qty 200

## 2017-06-18 MED ORDER — APIXABAN 5 MG PO TABS
5.0000 mg | ORAL_TABLET | Freq: Two times a day (BID) | ORAL | Status: DC
  Administered 2017-06-20 – 2017-06-21 (×3): 5 mg via ORAL
  Filled 2017-06-18 (×3): qty 1

## 2017-06-18 MED ORDER — SODIUM CHLORIDE 0.9 % IV BOLUS (SEPSIS)
500.0000 mL | Freq: Once | INTRAVENOUS | Status: AC
  Administered 2017-06-18: 500 mL via INTRAVENOUS

## 2017-06-18 MED ORDER — DILTIAZEM LOAD VIA INFUSION
15.0000 mg | Freq: Once | INTRAVENOUS | Status: AC
  Administered 2017-06-18: 15 mg via INTRAVENOUS
  Filled 2017-06-18: qty 15

## 2017-06-18 MED ORDER — NICOTINE 21 MG/24HR TD PT24
21.0000 mg | MEDICATED_PATCH | Freq: Every day | TRANSDERMAL | Status: DC
  Administered 2017-06-19 – 2017-06-21 (×3): 21 mg via TRANSDERMAL
  Filled 2017-06-18 (×3): qty 1

## 2017-06-18 MED ORDER — SENNOSIDES-DOCUSATE SODIUM 8.6-50 MG PO TABS: 1.0000 | ORAL_TABLET | Freq: Every evening | ORAL | Status: DC | PRN

## 2017-06-18 MED ORDER — ACETAMINOPHEN 650 MG RE SUPP
650.0000 mg | Freq: Once | RECTAL | Status: AC
  Administered 2017-06-18: 650 mg via RECTAL
  Filled 2017-06-18: qty 1

## 2017-06-18 MED ORDER — SODIUM CHLORIDE 0.9 % IV SOLN
1000.0000 mL | INTRAVENOUS | Status: DC
Start: 2017-06-18 — End: 2017-06-21
  Administered 2017-06-18 – 2017-06-19 (×2): 1000 mL via INTRAVENOUS

## 2017-06-18 MED ORDER — ONDANSETRON HCL 4 MG PO TABS: 4.0000 mg | ORAL_TABLET | Freq: Four times a day (QID) | ORAL | Status: DC | PRN

## 2017-06-18 MED ORDER — ACETAMINOPHEN 325 MG PO TABS
650.0000 mg | ORAL_TABLET | Freq: Four times a day (QID) | ORAL | Status: DC | PRN
  Administered 2017-06-21: 650 mg via ORAL
  Filled 2017-06-18: qty 2

## 2017-06-18 MED ORDER — CEFEPIME HCL 2 G IJ SOLR
2.0000 g | Freq: Two times a day (BID) | INTRAMUSCULAR | Status: DC
  Administered 2017-06-18 – 2017-06-19 (×2): 2 g via INTRAVENOUS
  Filled 2017-06-18 (×3): qty 2

## 2017-06-18 MED ORDER — INSULIN ASPART 100 UNIT/ML ~~LOC~~ SOLN: 0.0000 [IU] | Freq: Every day | SUBCUTANEOUS | Status: DC

## 2017-06-18 MED ORDER — PRAVASTATIN SODIUM 40 MG PO TABS: 80.0000 mg | ORAL_TABLET | Freq: Every day | ORAL | Status: DC

## 2017-06-18 MED ORDER — SODIUM CHLORIDE 0.9 % IV BOLUS (SEPSIS): 1000.0000 mL | Freq: Once | INTRAVENOUS | Status: DC

## 2017-06-18 MED ORDER — ACETAMINOPHEN 650 MG RE SUPP: 650.0000 mg | Freq: Four times a day (QID) | RECTAL | Status: DC | PRN

## 2017-06-18 MED ORDER — DILTIAZEM HCL-DEXTROSE 100-5 MG/100ML-% IV SOLN (PREMIX)
5.0000 mg/h | INTRAVENOUS | Status: DC
  Administered 2017-06-18: 5 mg/h via INTRAVENOUS
  Administered 2017-06-18 (×2): 20 mg/h via INTRAVENOUS
  Administered 2017-06-19: 5 mg/h via INTRAVENOUS
  Filled 2017-06-18 (×5): qty 100

## 2017-06-18 MED ORDER — DILTIAZEM HCL ER COATED BEADS 180 MG PO CP24: 180.0000 mg | ORAL_CAPSULE | Freq: Every day | ORAL | Status: DC

## 2017-06-18 NOTE — ED Triage Notes (Signed)
Arrived via EMS from [facility] - nonverbal, withdraws to pain, tachypneic, atrial fib with RVR rate 150-160

## 2017-06-18 NOTE — ED Notes (Signed)
Admitting NP in to assess for admission; EDT in to draw blood for second BC; RT in to assess - BiPap removed - placed on 15 L NRB mask

## 2017-06-18 NOTE — Progress Notes (Signed)
Patient admitted to 6E05 from ED via stretcher.  Bed in low position, wheels locked.  Patient alert to self only.  Telemetry monitor applied.  Updated son at bedside.

## 2017-06-18 NOTE — Progress Notes (Signed)
Pharmacy Antibiotic Note  JAYLEEN SCAGLIONE is a 82 y.o. female admitted on 06/18/2017 with sepsis.   Plan: Cefepime 2 g q12h Vanc 1 g q12h Monitor renal fx cx vt prn  Height:  (172.7 cm) Weight: 235 lb (106.6 kg) IBW/kg (Calculated) : 63.9  Temp (24hrs), Avg:99.3 F (37.4 C), Min:97.6 F (36.4 C), Max:100.9 F (38.3 C)  Recent Labs  Lab 06/18/17 0747  LATICACIDVEN 4.47*    CrCl cannot be calculated (Patient's most recent lab result is older than the maximum 21 days allowed.).    No Known Allergies  Isaac Bliss, PharmD, BCPS, BCCCP Clinical Pharmacist Clinical phone for 06/18/2017 from 7a-3:30p: [phone] If after 3:30p, please call main pharmacy at: [phone] 06/18/2017 8:55 AM

## 2017-06-18 NOTE — ED Notes (Signed)
Alison Doyle (son) and his wife Jennilee Demarco are to be the only visitors in the room.   [phone]

## 2017-06-18 NOTE — ED Notes (Signed)
Contact number for son, [name] [phone])

## 2017-06-18 NOTE — Progress Notes (Signed)
Patient placed on bipap per MD. Mouth suctioned prior to bipap placement. MD aware of food particles that were suctioned out of patient's mouth.

## 2017-06-18 NOTE — ED Notes (Signed)
Pure wick placed - hooked to low continuous suction

## 2017-06-18 NOTE — ED Notes (Signed)
1300: admitting team notified of hypotension - no orders given - cardizem drip continues to run at 5 mg/hr; NS infusing at 25 cc/hr

## 2017-06-18 NOTE — ED Notes (Signed)
Report called to Tacey Ruiz, RN on [address]

## 2017-06-18 NOTE — ED Provider Notes (Signed)
[facility] EMERGENCY DEPARTMENT Provider Note   CSN: [id number] Arrival date & time: 06/18/17  4098     History   Chief Complaint Chief Complaint  Patient presents with  . Fever    HPI Alison Doyle is a 82 y.o. female.  82 year old female who has a history of sepsis as well as toxic encephalopathy presents from nursing home with respiratory distress.  Patient has a DNR as well as a most form presents to staff from the patient to have increased work of breathing as well as altered mental status.  She also history of diabetes and EMS was called and patient's blood sugar was over 100.  Patient placed on a non-rebreather and transported here.  No further history obtainable due to her current state     Past Medical History:  Diagnosis Date  . Diabetes mellitus     Patient Active Problem List   Diagnosis Date Noted  . Coagulase negative Staphylococcus bacteremia 12/30/2016  . Subarachnoid hemorrhage 12/28/2016  . Acute pain of right shoulder   . Shoulder pain, left   . Epidural hematoma (HCC)   . Cerebrovascular accident (CVA) due to embolism of right middle cerebral artery (HCC)   . Paroxysmal atrial fibrillation (HCC)   . Diabetes mellitus (HCC)   . Sepsis (HCC) 12/26/2016  . Toxic encephalopathy     Past Surgical History:  Procedure Laterality Date  . REPLACEMENT TOTAL KNEE Right 2001     OB History   None      Home Medications    Prior to Admission medications   Medication Sig Start Date End Date Taking? Authorizing Provider  acetaminophen (TYLENOL) 500 MG tablet Take 1 tablet (500 mg total) every 6 (six) hours as needed by mouth for mild pain, moderate pain or headache. 01/04/17   Elease Etienne, MD  aspirin EC 81 MG tablet Take 81 mg by mouth daily.    [provider]  diltiazem (CARDIZEM CD) 180 MG 24 hr capsule Take 1 capsule (180 mg total) daily by mouth. 01/05/17   Hongalgi, Maximino Greenland, MD  insulin aspart (NOVOLOG) 100  UNIT/ML injection Inject 0-9 Units 3 (three) times daily with meals into the skin. CBG < 70: implement hypoglycemia protocol CBG 70 - 120: 0 units CBG 121 - 150: 1 unit CBG 151 - 200: 2 units CBG 201 - 250: 3 units CBG 251 - 300: 5 units CBG 301 - 350: 7 units CBG 351 - 400: 9 units CBG > 400: call MD. 01/04/17   Elease Etienne, MD  insulin glargine (LANTUS) 100 UNIT/ML injection Inject 0.1 mLs (10 Units total) daily into the skin. 01/05/17   Hongalgi, Maximino Greenland, MD  Maltodextrin-Xanthan Gum (RESOURCE THICKENUP CLEAR) POWD Oral, As needed, other 01/04/17   Elease Etienne, MD  metFORMIN (GLUCOPHAGE) 850 MG tablet Take 850 mg by mouth 2 (two) times daily with a meal.    [provider]  nicotine (NICODERM CQ - DOSED IN MG/24 HOURS) 21 mg/24hr patch Place 1 patch (21 mg total) daily onto the skin. 01/05/17   Hongalgi, Maximino Greenland, MD  pravastatin (PRAVACHOL) 80 MG tablet Take 1 tablet (80 mg total) at bedtime by mouth. 01/04/17   Hongalgi, Maximino Greenland, MD  traMADol (ULTRAM) 50 MG tablet Take 1 tablet (50 mg total) every 6 (six) hours as needed by mouth for severe pain. 01/04/17   Elease Etienne, MD    Family History No family history on file.  Social  History Social History   Tobacco Use  . Smoking status: Current Every Day Smoker    Packs/day: 1.00    Types: Cigarettes  . Smokeless tobacco: Current User  Substance Use Topics  . Alcohol use: No  . Drug use: No     Allergies   Patient has no known allergies.   Review of Systems Review of Systems  Unable to perform ROS: Acuity of condition     Physical Exam Updated Vital Signs SpO2 (!) 50% Comment: RA; 84% on 15 L NRB  Physical Exam  Constitutional: She appears well-developed and well-nourished. She appears lethargic.  Non-toxic appearance. No distress.  HENT:  Head: Normocephalic and atraumatic.  Eyes: Pupils are equal, round, and reactive to light. Conjunctivae, EOM and lids are normal.  Neck: Normal range of  motion. Neck supple. No tracheal deviation present. No thyroid mass present.  Cardiovascular: Normal rate, regular rhythm and normal heart sounds. Exam reveals no gallop.  No murmur heard. Pulmonary/Chest: Tachypnea noted. She is in respiratory distress. She has decreased breath sounds in the right upper field and the left upper field. She has no wheezes. She has rhonchi in the right upper field and the left upper field. She has no rales.  Abdominal: Soft. Normal appearance and bowel sounds are normal. She exhibits no distension. There is no tenderness. There is no rebound and no CVA tenderness.  Musculoskeletal: Normal range of motion. She exhibits no edema or tenderness.  Neurological: She appears lethargic. A cranial nerve deficit is present. GCS eye subscore is 4. GCS verbal subscore is 2. GCS motor subscore is 3.  Skin: Skin is warm and dry. No abrasion and no rash noted.  Psychiatric: She is inattentive.  Nursing note and vitals reviewed.    ED Treatments / Results  Labs (all labs ordered are listed, but only abnormal results are displayed) Labs Reviewed  CULTURE, BLOOD (ROUTINE X 2)  CULTURE, BLOOD (ROUTINE X 2)  COMPREHENSIVE METABOLIC PANEL  CBC WITH DIFFERENTIAL/PLATELET  URINALYSIS, ROUTINE W REFLEX MICROSCOPIC  I-STAT CG4 LACTIC ACID, ED    EKG None  Radiology No results found.  Procedures Procedures (including critical care time)  Medications Ordered in ED Medications  0.9 %  sodium chloride infusion (has no administration in time range)     Initial Impression / Assessment and Plan / ED Course  I have reviewed the triage vital signs and the nursing notes.  Pertinent labs & imaging results that were available during my care of the patient were reviewed by me and considered in my medical decision making (see chart for details).    Patient respiratory distress on arrival and was placed on BiPAP.  Work of breathing greatly improved.  She was also in atrial  fibrillation with rapid ventricular rate response and was given Cardizem 10 mg IV push and started on a Cardizem drip.  Heart rate remain elevated encouraged and IV push was repeated as well as her drip was adjusted. Given IV fluids here as well as antipyretics.  Chest x-ray consistent with healthcare associate pneumonia.  Code sepsis initiated and patient given IV fluid bolus.  Start her antibiotics and will be admitted to the hospitalist service  CRITICAL CARE Performed by: Toy Baker Total critical care time: 60 minutes Critical care time was exclusive of separately billable procedures and treating other patients. Critical care was necessary to treat or prevent imminent or life-threatening deterioration. Critical care was time spent personally by me on the following activities: development of  treatment plan with patient and/or surrogate as well as nursing, discussions with consultants, evaluation of patient's response to treatment, examination of patient, obtaining history from patient or surrogate, ordering and performing treatments and interventions, ordering and review of laboratory studies, ordering and review of radiographic studies, pulse oximetry and re-evaluation of patient's condition.  Final Clinical Impressions(s) / ED Diagnoses   Final diagnoses:  None    ED Discharge Orders    None       Lorre Nick, MD 06/18/17 [phone]

## 2017-06-18 NOTE — H&P (Signed)
History and Physical    Alison Doyle:[id number] DOB: September 15, 1935 DOA: 06/18/2017  PCP: [name], MD Patient coming from: [facility]  Chief Complaint: sob/fever  HPI: Alison Doyle is a 82 y.o. female with medical history significant for stroke with left hemiparesis,A. Fib, diabetes ecubitus ulcers, presents to the emergency department with the chief complaint of fever shortness of breath. . Initial evaluation reveals acute respiratory failure likely related to healthcare associated pneumonia in the setting of atrial fibrillation with rapid ventricular response and sepsis. [facility] are asked to admit  Information is obtained from the chart and the grandson who is at the bedside. He states in November patient had a stroke was transferred to facility has basically been bed bound since then. He said recently she had become more responsive and communicative. Reportedly she was found in respiratory distress at the facility this morning. grandson says he was called and told that her "vital signs were failing". Is been no report complaints of pain but grandson indicates she has been developing a cough. Unclear if it was productive. Staff also reported she was "unresponsive" according to grandson. She is incontinent of stool and bowel and mostly bedbound. He states she was unable to make her wants and needs known. EMS was called she was placed on a nonrebreather and transported to the hospital.    ED Course: in the emergency department max temperatures 100.9 he has tachypnea, hypoxia, atrial fibrillation with rapid ventricular response leukocytosis, elevated lactic acid. She is provided with IV fluids BiPAP and IV antibiotics.  Review of Systems: As per HPI otherwise all other systems reviewed and are negative.   Ambulatory Status: patient is a resident at [facility] nursing facility as mostly bedbound  Past Medical History:  Diagnosis Date  . Diabetes mellitus     Past  Surgical History:  Procedure Laterality Date  . REPLACEMENT TOTAL KNEE Right 2001    Social History   Socioeconomic History  . Marital status: Widowed    Spouse name: Not on file  . Number of children: Not on file  . Years of education: Not on file  . Highest education level: Not on file  Occupational History  . Not on file  Social Needs  . Financial resource strain: Not on file  . Food insecurity:    Worry: Not on file    Inability: Not on file  . Transportation needs:    Medical: Not on file    Non-medical: Not on file  Tobacco Use  . Smoking status: Current Every Day Smoker    Packs/day: 1.00    Types: Cigarettes  . Smokeless tobacco: Current User  Substance and Sexual Activity  . Alcohol use: No  . Drug use: No  . Sexual activity: Not on file  Lifestyle  . Physical activity:    Days per week: Not on file    Minutes per session: Not on file  . Stress: Not on file  Relationships  . Social connections:    Talks on phone: Not on file    Gets together: Not on file    Attends religious service: Not on file    Active member of club or organization: Not on file    Attends meetings of clubs or organizations: Not on file    Relationship status: Not on file  . Intimate partner violence:    Fear of current or ex partner: Not on file    Emotionally abused: Not on file    Physically abused:  Not on file    Forced sexual activity: Not on file  Other Topics Concern  . Not on file  Social History Narrative  . Not on file    No Known Allergies  No family history on file. family medical history reviewed and noncontributory to the admission of this elderly lady  Prior to Admission medications   Medication Sig Start Date End Date Taking? Authorizing Provider  acetaminophen (TYLENOL) 500 MG tablet Take 1 tablet (500 mg total) every 6 (six) hours as needed by mouth for mild pain, moderate pain or headache. 01/04/17   Elease Etienne, MD  aspirin EC 81 MG tablet Take 81 mg  by mouth daily.    [provider]  diltiazem (CARDIZEM CD) 180 MG 24 hr capsule Take 1 capsule (180 mg total) daily by mouth. 01/05/17   Hongalgi, Maximino Greenland, MD  insulin aspart (NOVOLOG) 100 UNIT/ML injection Inject 0-9 Units 3 (three) times daily with meals into the skin. CBG < 70: implement hypoglycemia protocol CBG 70 - 120: 0 units CBG 121 - 150: 1 unit CBG 151 - 200: 2 units CBG 201 - 250: 3 units CBG 251 - 300: 5 units CBG 301 - 350: 7 units CBG 351 - 400: 9 units CBG > 400: call MD. 01/04/17   Elease Etienne, MD  insulin glargine (LANTUS) 100 UNIT/ML injection Inject 0.1 mLs (10 Units total) daily into the skin. 01/05/17   Hongalgi, Maximino Greenland, MD  Maltodextrin-Xanthan Gum (RESOURCE THICKENUP CLEAR) POWD Oral, As needed, other 01/04/17   Elease Etienne, MD  metFORMIN (GLUCOPHAGE) 850 MG tablet Take 850 mg by mouth 2 (two) times daily with a meal.    [provider]  nicotine (NICODERM CQ - DOSED IN MG/24 HOURS) 21 mg/24hr patch Place 1 patch (21 mg total) daily onto the skin. 01/05/17   Hongalgi, Maximino Greenland, MD  pravastatin (PRAVACHOL) 80 MG tablet Take 1 tablet (80 mg total) at bedtime by mouth. 01/04/17   Hongalgi, Maximino Greenland, MD  traMADol (ULTRAM) 50 MG tablet Take 1 tablet (50 mg total) every 6 (six) hours as needed by mouth for severe pain. 01/04/17   Elease Etienne, MD    Physical Exam: Vitals:   06/18/17 0737 06/18/17 0745 06/18/17 0815 06/18/17 0900  BP:  94/70 97/69 100/79  Pulse:      Resp:  (!) 35 (!) 28 (!) 25  Temp: (!) 100.9 F (38.3 C)     TempSrc: Rectal     SpO2:      Weight:      Height:         General:  Appears frail pale alert in no acute distress Eyes:  PERRL, EOMI, normal lids, iris ENT:  grossly normal hearing, lips & tongue, mucous membranes of her mouth are pink slightly dry Neck:  no LAD, masses or thyromegaly Cardiovascular:  Irregularly irregular, no m/r/g. trace LE edema. Chronic venous stasis changes. Respiratory:  Mild  increased work of breathing respirations somewhat shallow breath sounds distant and coarse throughout. I hear no crackles. No wheezes. She has a very weak cough effort Abdomen:  soft, ntnd, positive bowel sounds throughout no guarding or rebounding Skin:  no rash or induration seen on limited exam Musculoskeletal:  grossly normal tone BUE/BLE, good ROM, no bony abnormality Psychiatric:  grossly normal mood and affect, speech fluent and appropriate, AOx3 Neurologic:  She is alert noncommunicative attempts to follow simple commands.   Labs on Admission: I have personally reviewed  following labs and imaging studies  CBC: Recent Labs  Lab 06/18/17 0730  WBC 21.2*  NEUTROABS 18.9*  HGB 17.1*  HCT 52.4*  MCV 91.4  PLT 348   Basic Metabolic Panel: Recent Labs  Lab 06/18/17 0730  NA 136  K 4.6  CL 103  CO2 23  GLUCOSE 208*  BUN 20  CREATININE 0.68  CALCIUM 8.3*   GFR: Estimated Creatinine Clearance: 72.9 mL/min (by C-G formula based on SCr of 0.68 mg/dL). Liver Function Tests: Recent Labs  Lab 06/18/17 0730  AST 22  ALT 18  ALKPHOS 113  BILITOT 0.6  PROT 5.6*  ALBUMIN 2.1*   No results for input(s): LIPASE, AMYLASE in the last 168 hours. No results for input(s): AMMONIA in the last 168 hours. Coagulation Profile: No results for input(s): INR, PROTIME in the last 168 hours. Cardiac Enzymes: Recent Labs  Lab 06/18/17 0730  TROPONINI <0.03   BNP (last 3 results) No results for input(s): PROBNP in the last 8760 hours. HbA1C: No results for input(s): HGBA1C in the last 72 hours. CBG: No results for input(s): GLUCAP in the last 168 hours. Lipid Profile: No results for input(s): CHOL, HDL, LDLCALC, TRIG, CHOLHDL, LDLDIRECT in the last 72 hours. Thyroid Function Tests: No results for input(s): TSH, T4TOTAL, FREET4, T3FREE, THYROIDAB in the last 72 hours. Anemia Panel: No results for input(s): VITAMINB12, FOLATE, FERRITIN, TIBC, IRON, RETICCTPCT in the last 72  hours. Urine analysis:    Component Value Date/Time   COLORURINE AMBER (A) 01/25/2017 2242   APPEARANCEUR HAZY (A) 01/25/2017 2242   LABSPEC 1.025 01/25/2017 2242   PHURINE 5.0 01/25/2017 2242   GLUCOSEU NEGATIVE 01/25/2017 2242   HGBUR NEGATIVE 01/25/2017 2242   BILIRUBINUR NEGATIVE 01/25/2017 2242   KETONESUR NEGATIVE 01/25/2017 2242   PROTEINUR NEGATIVE 01/25/2017 2242   UROBILINOGEN 0.2 11/29/2014 2049   NITRITE POSITIVE (A) 01/25/2017 2242   LEUKOCYTESUR NEGATIVE 01/25/2017 2242    Creatinine Clearance: Estimated Creatinine Clearance: 72.9 mL/min (by C-G formula based on SCr of 0.68 mg/dL).  Sepsis Labs: (procalcitonin:4,lacticidven:4) )No results found for this or any previous visit (from the past 240 hour(s)).   Radiological Exams on Admission: Dg Chest Port 1 View  Result Date: 06/18/2017 CLINICAL DATA:  Shortness of breath EXAM: PORTABLE CHEST 1 VIEW COMPARISON:  01/25/2017 FINDINGS: Patchy bibasilar airspace opacities, left greater than right concerning for pneumonia. Heart is upper limits normal in size. No visible effusions or acute bony abnormality. Degenerative changes in the shoulders. IMPRESSION: Patchy bibasilar opacities, left greater than right concerning for pneumonia. Electronically Signed   By: Charlett Nose M.D.   On: 06/18/2017 07:43    EKG: Atrial fibrillation with rapid V-rate Borderline right axis deviation Low voltage, extremity and precordial leads Repolarization abnormality, prob rate related  Assessment/Plan Principal Problem:   Acute respiratory failure (HCC) Active Problems:   Sepsis (HCC)   Paroxysmal atrial fibrillation (HCC)   Diabetes mellitus (HCC)   HCAP (healthcare-associated pneumonia)   Atrial fibrillation with RVR (HCC)   Decubital ulcer   Acute encephalopathy   #1. Acute respiratory failure likely related to healthcare associated pneumonia in the setting of atrial fibrillation with rapid ventricular response and sepsis.  Oxygen saturation level 84% on room air reportedly. Chest x-ray concerning for pneumonia, EKG as noted above, she has a leukocytosis fever elevated lactic acid. She is placed on BiPAP and provide Cardizem in the emergency department.The time of admission BiPAP weaning in process -admit to step down -Wean BiPAP as able -  Oxygen supplementation -Monitor oxygen saturation level -Nebulizers as needed -antibiotics per protocol -continue IV fluids -track lactic acid -follow blood cultures -discussed with grandson a plan that included allowing patient 24 hours to show improvement and if none after that timeframe consider comfort care   #2. A. Fib with RVR. EKG as noted above. Likely related to pneumonia. home medications include Cardizem and eliquis. EKG as noted above. She is placed on a Cardizem drip in the emergency department. -continue Cardizem drip -resume home cardizem -wean cardizem  as able -continue Eliquis -TSH -monitor  3. Healthcare associated pneumonia. Chest x-ray as noted above. She has a fever and leukocytosis.cefepime and vancomycin started in the emergency department -continue IV antibiotics per protocol -Follow blood cultures -Sputum culture as able -Strep pneumo urine antigen -influenza panel -see above  #4. Sepsis. Max temperature 100.9, leukocytosis greater than 21, tachypnea, soft blood pressure,  hypoxia, elevated lactic acid, acute encephalopathy. Was provided with 3-1/2 L of normal saline in the emergency department as well as IV antibiotics. -Continue vigorous IV fluids -antibiotics as noted above -Track lactic acid  #5. Diabetes. Home medications include Lantus sliding scale NovoLog and metformin. Serum glucose 208 on admission. -Hold oral agents -Continue Lantus -Sliding scale insulin for optimal control -Obtain a hemoglobin A1c  #6. Acute encephalopathy. Reportedly patient was unresponsive at facility. Likely related to above.The time of admission eyes  were open she would attempt to follow simple commands.  -see above  #7. Decubitus ulcer on left heel and sacrum area -wound consult  DVT prophylaxis: eliquis Code Status: dnr  Family Communication: grandson at bedside  Disposition Plan: back to facility or comfort care  Consults called: palliative care  Admission status: inpatient    Gwenyth Bender MD [facility]  If 7PM-7AM, please contact night-coverage [email or web address] Password [facility]  06/18/2017, 9:48 AM

## 2017-06-18 NOTE — ED Notes (Signed)
Warm blankets applied for pt comfort - son remains at bedside

## 2017-06-18 NOTE — Consult Note (Signed)
WOC Nurse wound consult note Reason for Consult:sacrum and heel Patient from SNF, family member in the room.  I did not ask relationship Wound type: Stage 3 pressure injury, inner gluteal cleft Unstageable pressure injury left heel Pressure Injury POA: Yes Measurement: Gluteal cleft: 1.3cm x 0.2cm x 0.1cm  Left heel: 0.5cm x 2.0cm x 0cm  Wound bed: Gluteal cleft: 100% pink and clean Left heel: 90% black eschar, hard and stable/10% dry calloused skin Drainage (amount, consistency, odor) none from either site Periwound: intact  Dressing procedure/placement/frequency: 1.silicone foam to the gluteal cleft and the heel wound 2. Bilateral Prevalon boots ordered, patient with foam booties from SNF that are not providing adequate offloading. Patient with severe foot drop. 3. Requested patient to have air mattress ordered since ED would be holding her for step down.   Discussed POC with bedside nurse.  Re consult if needed, will not follow at this time. Thanks  Emiliano Welshans [employer], RN,CWOCN, CNS, CWON-AP [phone])

## 2017-06-18 NOTE — ED Notes (Signed)
3rd liter NS hung to infuse at 25 cc/hr per pump as pt has increased rhonchi in bilat lobes - MD made aware of same

## 2017-06-19 ENCOUNTER — Encounter (HOSPITAL_COMMUNITY): Payer: Self-pay

## 2017-06-19 DIAGNOSIS — L89620 Pressure ulcer of left heel, unstageable: Secondary | ICD-10-CM

## 2017-06-19 DIAGNOSIS — Z66 Do not resuscitate: Secondary | ICD-10-CM

## 2017-06-19 DIAGNOSIS — I69328 Other speech and language deficits following cerebral infarction: Secondary | ICD-10-CM

## 2017-06-19 DIAGNOSIS — I480 Paroxysmal atrial fibrillation: Secondary | ICD-10-CM

## 2017-06-19 DIAGNOSIS — L89323 Pressure ulcer of left buttock, stage 3: Secondary | ICD-10-CM

## 2017-06-19 DIAGNOSIS — Z22322 Carrier or suspected carrier of Methicillin resistant Staphylococcus aureus: Secondary | ICD-10-CM

## 2017-06-19 DIAGNOSIS — E11622 Type 2 diabetes mellitus with other skin ulcer: Secondary | ICD-10-CM

## 2017-06-19 DIAGNOSIS — R7881 Bacteremia: Secondary | ICD-10-CM

## 2017-06-19 DIAGNOSIS — B957 Other staphylococcus as the cause of diseases classified elsewhere: Secondary | ICD-10-CM

## 2017-06-19 DIAGNOSIS — F17210 Nicotine dependence, cigarettes, uncomplicated: Secondary | ICD-10-CM

## 2017-06-19 DIAGNOSIS — J189 Pneumonia, unspecified organism: Secondary | ICD-10-CM

## 2017-06-19 LAB — BLOOD CULTURE ID PANEL (REFLEXED)
Acinetobacter baumannii: NOT DETECTED
CANDIDA GLABRATA: NOT DETECTED
CANDIDA KRUSEI: NOT DETECTED
Candida albicans: NOT DETECTED
Candida parapsilosis: NOT DETECTED
Candida tropicalis: NOT DETECTED
Enterobacter cloacae complex: NOT DETECTED
Enterobacteriaceae species: NOT DETECTED
Enterococcus species: NOT DETECTED
Escherichia coli: NOT DETECTED
HAEMOPHILUS INFLUENZAE: NOT DETECTED
KLEBSIELLA OXYTOCA: NOT DETECTED
KLEBSIELLA PNEUMONIAE: NOT DETECTED
LISTERIA MONOCYTOGENES: NOT DETECTED
Methicillin resistance: NOT DETECTED
Neisseria meningitidis: NOT DETECTED
PSEUDOMONAS AERUGINOSA: NOT DETECTED
Proteus species: NOT DETECTED
SERRATIA MARCESCENS: NOT DETECTED
STAPHYLOCOCCUS SPECIES: DETECTED — AB
STREPTOCOCCUS AGALACTIAE: NOT DETECTED
STREPTOCOCCUS PNEUMONIAE: NOT DETECTED
STREPTOCOCCUS SPECIES: NOT DETECTED
Staphylococcus aureus (BCID): NOT DETECTED
Streptococcus pyogenes: NOT DETECTED

## 2017-06-19 LAB — BASIC METABOLIC PANEL
Anion gap: 7 (ref 5–15)
BUN: 19 mg/dL (ref 6–20)
CHLORIDE: 105 mmol/L (ref 101–111)
CO2: 25 mmol/L (ref 22–32)
CREATININE: 0.59 mg/dL (ref 0.44–1.00)
Calcium: 8.1 mg/dL — ABNORMAL LOW (ref 8.9–10.3)
GFR calc Af Amer: >60 mL/min (ref >60)
GFR calc non Af Amer: >60 mL/min (ref >60)
Glucose, Bld: 119 mg/dL — ABNORMAL HIGH (ref 65–99)
Potassium: 4.2 mmol/L (ref 3.5–5.1)
SODIUM: 137 mmol/L (ref 135–145)

## 2017-06-19 LAB — URINALYSIS, ROUTINE W REFLEX MICROSCOPIC
BILIRUBIN URINE: NEGATIVE
GLUCOSE, UA: NEGATIVE mg/dL
KETONES UR: 5 mg/dL — AB
NITRITE: NEGATIVE
PH: 5 (ref 5.0–8.0)
Protein, ur: 30 mg/dL — AB
Specific Gravity, Urine: 1.025 (ref 1.005–1.030)

## 2017-06-19 LAB — CBC
HCT: 46.6 % — ABNORMAL HIGH (ref 36.0–46.0)
Hemoglobin: 14.9 g/dL (ref 12.0–15.0)
MCH: 29 pg (ref 26.0–34.0)
MCHC: 32 g/dL (ref 30.0–36.0)
MCV: 90.7 fL (ref 78.0–100.0)
PLATELETS: 291 10*3/uL (ref 150–400)
RBC: 5.14 MIL/uL — ABNORMAL HIGH (ref 3.87–5.11)
RDW: 15.5 % (ref 11.5–15.5)
WBC: 12.2 10*3/uL — ABNORMAL HIGH (ref 4.0–10.5)

## 2017-06-19 LAB — MRSA PCR SCREENING: MRSA by PCR: POSITIVE — AB

## 2017-06-19 LAB — INFLUENZA PANEL BY PCR (TYPE A & B)
INFLAPCR: NEGATIVE
INFLBPCR: NEGATIVE

## 2017-06-19 LAB — GLUCOSE, CAPILLARY
GLUCOSE-CAPILLARY: 108 mg/dL — AB (ref 65–99)
GLUCOSE-CAPILLARY: 95 mg/dL (ref 65–99)
Glucose-Capillary: 113 mg/dL — ABNORMAL HIGH (ref 65–99)
Glucose-Capillary: 99 mg/dL (ref 65–99)
Glucose-Capillary: 92 mg/dL (ref 65–99)

## 2017-06-19 LAB — STREP PNEUMONIAE URINARY ANTIGEN: STREP PNEUMO URINARY ANTIGEN: NEGATIVE

## 2017-06-19 LAB — LACTIC ACID, PLASMA: Lactic Acid, Venous: 2.3 mmol/L (ref 0.5–1.9)

## 2017-06-19 MED ORDER — MUPIROCIN 2 % EX OINT
1.0000 "application " | TOPICAL_OINTMENT | Freq: Two times a day (BID) | CUTANEOUS | Status: DC
  Administered 2017-06-19 – 2017-06-21 (×4): 1 via NASAL
  Filled 2017-06-19 (×2): qty 22

## 2017-06-19 MED ORDER — CHLORHEXIDINE GLUCONATE CLOTH 2 % EX PADS
6.0000 | MEDICATED_PAD | Freq: Every day | CUTANEOUS | Status: DC
  Administered 2017-06-20: 6 via TOPICAL

## 2017-06-19 MED ORDER — CEFTRIAXONE SODIUM 2 G IJ SOLR
2.0000 g | INTRAMUSCULAR | Status: DC
  Filled 2017-06-19 (×2): qty 20

## 2017-06-19 NOTE — Progress Notes (Signed)
PROGRESS NOTE    Alison Doyle  NWG:[id number] DOB: 1935-07-30 DOA: 06/18/2017 PCP: [name], MD   Brief Narrative: Patient is a 82 year old female with past medical history significant for a stroke with left-sided hemiparesis, atrial  fibrillation, diabetes and decubitus ulcers who was sent to the emergency department from  [facility] nursing facility with fever and shortness of breath. Patient was found to have pneumonia on presentation and also A. fib with RVR.Started on broad-spectrum antibiotics.  Start on Cardizem for A. fib with RVR. Both 2 sets of  Blood cultures growing methicillin sensitive coagulase-negative staph .  ID following.  Palliative care also following for low threshold for transitioning he care to comfort.   Assessment & Plan:   Principal Problem:   Acute respiratory failure (HCC) Active Problems:   Sepsis (HCC)   Paroxysmal atrial fibrillation (HCC)   Diabetes mellitus (HCC)   HCAP (healthcare-associated pneumonia)   Decubital ulcer   Acute encephalopathy  Acute respiratory failure:Secondary to healthcare respiratory pneumonia.  She was saturating 84% on room air on presentation.  Currently saturating fine on oxygen via nasal cannula.  Sepsis secondary to healthcare associated pneumonia: Nursing home resident.  Chest x-ray was concerning for pneumonia.Showed patchy bibasilar opacities, left greater than right.Presented with fever, leukocytosis and lactic acidosis.  Started on broad-spectrum antibiotics.  Currently on ceftriaxone and vancomycin. She was hypotensive on presentation.  Aggressively fluid resuscitated on presentation. Continue IV fluids.  We will continue to monitor lactic acid level. BP on the lower side.  Bacteremia: 2 sets of blood culture growing methicillin sensitive coagulase-negative staph .  ID consulted.  Recommended to continue current antibiotics.  Will follow final blood culture report.  A. fib with RVR: Was started on Cardizem on  presentation.  Currently heart rate stable, we will resume her home Cardizem after her BP improves..  Also on Eliquis for anticoagulation.  Diabetes mellitus: On Lantus and sliding-scale insulin at home along with metformin.  Continue sliding-scale insulin here.  Altered mental status: Secondary to metabolic encephalopathy.  She was reportedly unresponsive at the facility. Much improved now. She is nonverbal on baseline from her previous stroke.  During my evaluation today she looks comfortable. Patient suspected to have severe aspiration risk .Speech recommended n.p.o.  Decubitus ulcer on the left heel and sacrum: Wound care consulted.  Goals of care/poor prognosis/advanced age/multiple comorbidities: Admitting provider discussed with the grandson.  Family interested to monitor her for 24 hours and if no improvement they are considering comfort care.  Palliative care consulted.  Family meeting tomorrow.  DVT prophylaxis: Eliquis Code Status: DNR Family Communication: I discussed with her son today and updated the current plan and situation Disposition Plan: Needs to be determined   Consultants: Palliative care, ID  Procedures: None  Antimicrobials: Vancomycin day 2, ceftriaxone day 1  Subjective: Patient seen and examined at bedside this morning.  She looked comfortable during my evaluation.  She was not in any kind of distress.  Heart rate has slowed down.  Currently afebrile.  Objective: Vitals:   06/19/17 0346 06/19/17 0802 06/19/17 1149 06/19/17 1200  BP: 107/73 96/69 (!) 82/65   Pulse: 85 89 (!) 118   Resp: 18 (!) 22 (!) 23   Temp: (!) 97.5 F (36.4 C)  97.8 F (36.6 C)   TempSrc: Oral  Oral   SpO2: 90% 99% (!) 86% 96%  Weight: 85 kg (187 lb 6.4 oz)     Height:        Intake/Output Summary (  Last 24 hours) at 06/19/2017 1521 Last data filed at 06/19/2017 1500 Gross per 24 hour  Intake 3370.42 ml  Output 250 ml  Net 3120.42 ml   Filed Weights   06/18/17 0724  06/18/17 1745 06/19/17 0346  Weight: 106.6 kg (235 lb) 85.5 kg (188 lb 9.6 oz) 85 kg (187 lb 6.4 oz)    Examination:  General exam: Appears calm and comfortable ,Not in distress,average built HEENT:PERRL,Oral mucosa moist, Ear/Nose normal on gross exam Respiratory system: Bilateral decreased air entry Cardiovascular system: A. fib , no JVD, murmurs, rubs, gallops or clicks.  Pedal edema. Gastrointestinal system: Abdomen is nondistended, soft and nontender. No organomegaly or masses felt. Normal bowel sounds heard. Central nervous system:  Alert but not oriented.  Residual weakness on the left side, right upper extremity contracture  extremities: 2+ edema on bilateral lower extremities, no clubbing ,no cyanosis, distal peripheral pulses palpable. Skin: Pressure ulcer on the back,no icterus ,no pallor   Data Reviewed: I have personally reviewed following labs and imaging studies  CBC: Recent Labs  Lab 06/18/17 0730 06/19/17 0952  WBC 21.2* 12.2*  NEUTROABS 18.9*  --   HGB 17.1* 14.9  HCT 52.4* 46.6*  MCV 91.4 90.7  PLT 348 291   Basic Metabolic Panel: Recent Labs  Lab 06/18/17 0730 06/19/17 0952  NA 136 137  K 4.6 4.2  CL 103 105  CO2 23 25  GLUCOSE 208* 119*  BUN 20 19  CREATININE 0.68 0.59  CALCIUM 8.3* 8.1*   GFR: Estimated Creatinine Clearance: 62.7 mL/min (by C-G formula based on SCr of 0.59 mg/dL). Liver Function Tests: Recent Labs  Lab 06/18/17 0730  AST 22  ALT 18  ALKPHOS 113  BILITOT 0.6  PROT 5.6*  ALBUMIN 2.1*   No results for input(s): LIPASE, AMYLASE in the last 168 hours. No results for input(s): AMMONIA in the last 168 hours. Coagulation Profile: No results for input(s): INR, PROTIME in the last 168 hours. Cardiac Enzymes: Recent Labs  Lab 06/18/17 0730  TROPONINI <0.03   BNP (last 3 results) No results for input(s): PROBNP in the last 8760 hours. HbA1C: Recent Labs    06/18/17 1904  HGBA1C 5.0   CBG: Recent Labs  Lab  06/18/17 2046 06/19/17 0345 06/19/17 0734 06/19/17 1145  GLUCAP 146* 108* 95 113*   Lipid Profile: No results for input(s): CHOL, HDL, LDLCALC, TRIG, CHOLHDL, LDLDIRECT in the last 72 hours. Thyroid Function Tests: Recent Labs    06/18/17 1904  TSH 0.648   Anemia Panel: No results for input(s): VITAMINB12, FOLATE, FERRITIN, TIBC, IRON, RETICCTPCT in the last 72 hours. Sepsis Labs: Recent Labs  Lab 06/18/17 0747 06/18/17 1904 06/18/17 2142 06/19/17 0952  LATICACIDVEN 4.47* 2.0* 2.1* 2.3*    Recent Results (from the past 240 hour(s))  Blood Culture (routine x 2)     Status: None (Preliminary result)   Collection Time: 06/18/17  7:30 AM  Result Value Ref Range Status   Specimen Description BLOOD RIGHT ANTECUBITAL  Final   Special Requests   Final    BOTTLES DRAWN AEROBIC AND ANAEROBIC Blood Culture adequate volume   Culture  Setup Time   Final    IN BOTH AEROBIC AND ANAEROBIC BOTTLES Organism ID to follow GRAM POSITIVE COCCI CRITICAL RESULT CALLED TO, READ BACK BY AND VERIFIED WITH: Melven Sartorius [facility] 06/19/17 0304 JDW Performed at [facility], [address], [place], [place] [address]    Culture [facility] POSITIVE COCCI  Final  Report Status PENDING  Incomplete  Blood Culture ID Panel (Reflexed)     Status: Abnormal   Collection Time: 06/18/17  7:30 AM  Result Value Ref Range Status   Enterococcus species NOT DETECTED NOT DETECTED Final   Listeria monocytogenes NOT DETECTED NOT DETECTED Final   Staphylococcus species DETECTED (A) NOT DETECTED Final    Comment: Methicillin (oxacillin) susceptible coagulase negative staphylococcus. Possible blood culture contaminant (unless isolated from more than one blood culture draw or clinical case suggests pathogenicity). No antibiotic treatment is indicated for blood  culture contaminants. CRITICAL RESULT CALLED TO, READ BACK BY AND VERIFIED WITH: J LEDFORD PHARMD 06/19/17 0304 JDW    Staphylococcus aureus NOT DETECTED NOT  DETECTED Final   Methicillin resistance NOT DETECTED NOT DETECTED Final   Streptococcus species NOT DETECTED NOT DETECTED Final   Streptococcus agalactiae NOT DETECTED NOT DETECTED Final   Streptococcus pneumoniae NOT DETECTED NOT DETECTED Final   Streptococcus pyogenes NOT DETECTED NOT DETECTED Final   Acinetobacter baumannii NOT DETECTED NOT DETECTED Final   Enterobacteriaceae species NOT DETECTED NOT DETECTED Final   Enterobacter cloacae complex NOT DETECTED NOT DETECTED Final   Escherichia coli NOT DETECTED NOT DETECTED Final   Klebsiella oxytoca NOT DETECTED NOT DETECTED Final   Klebsiella pneumoniae NOT DETECTED NOT DETECTED Final   Proteus species NOT DETECTED NOT DETECTED Final   Serratia marcescens NOT DETECTED NOT DETECTED Final   Haemophilus influenzae NOT DETECTED NOT DETECTED Final   Neisseria meningitidis NOT DETECTED NOT DETECTED Final   Pseudomonas aeruginosa NOT DETECTED NOT DETECTED Final   Candida albicans NOT DETECTED NOT DETECTED Final   Candida glabrata NOT DETECTED NOT DETECTED Final   Candida krusei NOT DETECTED NOT DETECTED Final   Candida parapsilosis NOT DETECTED NOT DETECTED Final   Candida tropicalis NOT DETECTED NOT DETECTED Final    Comment: Performed at [facility], [address], [place], [place] [address]  Blood Culture (routine x 2)     Status: None (Preliminary result)   Collection Time: 06/18/17  9:33 AM  Result Value Ref Range Status   Specimen Description BLOOD LEFT HAND  Final   Special Requests   Final    BOTTLES DRAWN AEROBIC AND ANAEROBIC Blood Culture adequate volume   Culture   Final    NO GROWTH < 24 HOURS Performed at [facility], [address] [place], [place] [address]    Report Status PENDING  Incomplete  MRSA PCR Screening     Status: Abnormal   Collection Time: 06/18/17  8:38 PM  Result Value Ref Range Status   MRSA by PCR POSITIVE (A) NEGATIVE Final    Comment:        The GeneXpert MRSA Assay (FDA approved  for NASAL specimens only), is one component of a comprehensive MRSA colonization surveillance program. It is not intended to diagnose MRSA infection nor to guide or monitor treatment for MRSA infections. RESULT CALLED TO, READ BACK BY AND VERIFIED WITH: HALL RN 06/19/17 0306 JDW Performed at [facility], [address], [place], [place] [address]          Radiology Studies: Dg Chest Port 1 View  Result Date: 06/18/2017 CLINICAL DATA:  Shortness of breath EXAM: PORTABLE CHEST 1 VIEW COMPARISON:  01/25/2017 FINDINGS: Patchy bibasilar airspace opacities, left greater than right concerning for pneumonia. Heart is upper limits normal in size. No visible effusions or acute bony abnormality. Degenerative changes in the shoulders. IMPRESSION: Patchy bibasilar opacities, left  greater than right concerning for pneumonia. Electronically Signed   By: Charlett Nose M.D.   On: 06/18/2017 07:43        Scheduled Meds: . apixaban  5 mg Oral BID  . aspirin EC  81 mg Oral Daily  . Chlorhexidine Gluconate Cloth  6 each Topical Q0600  . insulin aspart  0-5 Units Subcutaneous QHS  . insulin aspart  0-9 Units Subcutaneous TID WC  . mupirocin ointment  1 application Nasal BID  . nicotine  21 mg Transdermal Daily  . scopolamine  1 patch Transdermal Q72H   Continuous Infusions: . sodium chloride 1,000 mL (06/18/17 0743)  . ceFEPime (MAXIPIME) IV Stopped (06/19/17 1017)  . diltiazem (CARDIZEM) infusion 5 mg/hr (06/18/17 1252)  . sodium chloride    . vancomycin Stopped (06/19/17 1035)     LOS: 1 day    Time spent: 35 mins.More than 50% of that time was spent in counseling and/or coordination of care.      Burnadette Pop, MD [facility] Pager [phone]  If 7PM-7AM, please contact night-coverage [email or web address] Password [email or web address] 06/19/2017, 3:21 PM

## 2017-06-19 NOTE — Progress Notes (Signed)
CRITICAL VALUE ALERT  Critical Value:  Lactic 2.3  Date & Time Notied:  06/09/2017 1131  Provider Notified: Consistent with previous  Orders Received/Actions taken: Continue abx

## 2017-06-19 NOTE — Progress Notes (Signed)
PHARMACY - PHYSICIAN COMMUNICATION CRITICAL VALUE ALERT - BLOOD CULTURE IDENTIFICATION (BCID)  Alison Doyle is an 82 y.o. female who presented to [facility] on 06/18/2017 with a chief complaint of shortness of breath and fever  Name of physician (or Provider) Contacted: Donnamarie Poag ([facility])  Current antibiotics: Vancomycin and Cefepime  Changes to prescribed antibiotics recommended:  No changes for now  Results for orders placed or performed during the hospital encounter of 06/18/17  Blood Culture ID Panel (Reflexed) (Collected: 06/18/2017  7:30 AM)  Result Value Ref Range   Enterococcus species NOT DETECTED NOT DETECTED   Listeria monocytogenes NOT DETECTED NOT DETECTED   Staphylococcus species DETECTED (A) NOT DETECTED   Staphylococcus aureus NOT DETECTED NOT DETECTED   Methicillin resistance NOT DETECTED NOT DETECTED   Streptococcus species NOT DETECTED NOT DETECTED   Streptococcus agalactiae NOT DETECTED NOT DETECTED   Streptococcus pneumoniae NOT DETECTED NOT DETECTED   Streptococcus pyogenes NOT DETECTED NOT DETECTED   Acinetobacter baumannii NOT DETECTED NOT DETECTED   Enterobacteriaceae species NOT DETECTED NOT DETECTED   Enterobacter cloacae complex NOT DETECTED NOT DETECTED   Escherichia coli NOT DETECTED NOT DETECTED   Klebsiella oxytoca NOT DETECTED NOT DETECTED   Klebsiella pneumoniae NOT DETECTED NOT DETECTED   Proteus species NOT DETECTED NOT DETECTED   Serratia marcescens NOT DETECTED NOT DETECTED   Haemophilus influenzae NOT DETECTED NOT DETECTED   Neisseria meningitidis NOT DETECTED NOT DETECTED   Pseudomonas aeruginosa NOT DETECTED NOT DETECTED   Candida albicans NOT DETECTED NOT DETECTED   Candida glabrata NOT DETECTED NOT DETECTED   Candida krusei NOT DETECTED NOT DETECTED   Candida parapsilosis NOT DETECTED NOT DETECTED   Candida tropicalis NOT DETECTED NOT DETECTED    Abran Duke 06/19/2017  3:12 AM

## 2017-06-19 NOTE — Evaluation (Signed)
Clinical/Bedside Swallow Evaluation Patient Details  Name: Alison Doyle MRN: [id number] Date of Birth: [birth year]  Today's Date: 06/19/2017 Time: SLP Start Time (ACUTE ONLY): 1459 SLP Stop Time (ACUTE ONLY): 1512 SLP Time Calculation (min) (ACUTE ONLY): 13 min  Past Medical History:  Past Medical History:  Diagnosis Date  . A-fib (HCC)   . Acute respiratory failure (HCC)   . CVA (cerebral vascular accident) (HCC)   . Diabetes mellitus   . Epidural hematoma (HCC)   . HCAP (healthcare-associated pneumonia)   . SAH (subarachnoid hemorrhage) (HCC)   . Sepsis [facility])    Past Surgical History:  Past Surgical History:  Procedure Laterality Date  . REPLACEMENT TOTAL KNEE Right 2001   HPI:  Pt is a 82 y.o. female who presents with sepsis likely related to PNA. PMH is significant for stroke with left hemiparesis, A. Fib, diabetes ecubitus ulcers. Pt was seen for dysphagia following stroke in November 2018 with MBS suggesting a high risk for aspiration. Dys 1 diet and honey thick liquids by spoon were recommended. Pt was clinically upgraded to Dys 2 diet and honey thick liquids by cup prior to discharge.   Assessment / Plan / Recommendation Clinical Impression  Pt is alert but does not follow commands or vocalize. She consumed bites of puree and spoonfuls of honey thick liquids as was recommended on most recent MBS with variable time from oral acceptance to swallow trigger that ranged from 6 to over 30 seconds. Suspect both prolonged oral transit but also delayed swallow trigger given clinical presentation and MBS report. Although no signs of aspiration were noted, vocal quality cannot be assessed and aspiration has previously been silent, making clincial assessment limited. Given the concern for PNA, MBS would be recommended to better assess oropharyngeal function and aspiration risk; however, note that palliative care meeting is planned for tomorrow. Will follow to see if MBS is within pt/family  GOC in light of likely ongoing aspiration risk from prior CVA. SLP Visit Diagnosis: Dysphagia, oropharyngeal phase (R13.12)    Aspiration Risk  Moderate aspiration risk;Severe aspiration risk    Diet Recommendation NPO except meds   Medication Administration: Crushed with puree    Other  Recommendations Oral Care Recommendations: Oral care QID Other Recommendations: Have oral suction available   Follow up Recommendations Skilled Nursing facility      Frequency and Duration min 2x/week  2 weeks       Prognosis Prognosis for Safe Diet Advancement: Fair Barriers to Reach Goals: Cognitive deficits;Severity of deficits      Swallow Study   General HPI: Pt is a 82 y.o. female who presents with sepsis likely related to PNA. PMH is significant for stroke with left hemiparesis, A. Fib, diabetes ecubitus ulcers. Pt was seen for dysphagia following stroke in November 2018 with MBS suggesting a high risk for aspiration. Dys 1 diet and honey thick liquids by spoon were recommended. Pt was clinically upgraded to Dys 2 diet and honey thick liquids by cup prior to discharge. Type of Study: Bedside Swallow Evaluation Previous Swallow Assessment: see HPI Diet Prior to this Study: NPO Temperature Spikes Noted: Yes(100.9) Respiratory Status: Nasal cannula History of Recent Intubation: No Behavior/Cognition: Alert;Doesn't follow directions Oral Cavity Assessment: Other (comment)(difficult to visualize - doesn't open mouth to command) Oral Care Completed by SLP: No Oral Cavity - Dentition: (difficult to visualize) Self-Feeding Abilities: Total assist Patient Positioning: Upright in bed Baseline Vocal Quality: Not observed Volitional Cough: Cognitively unable to elicit Volitional Swallow: Unable to  elicit    Oral/Motor/Sensory Function Overall Oral Motor/Sensory Function: (doesn't follow commands for formal assessment)   Ice Chips Ice chips: Not tested   Thin Liquid Thin Liquid: Not tested     Nectar Thick Nectar Thick Liquid: Not tested   Honey Thick Honey Thick Liquid: Impaired Presentation: Spoon Oral Phase Functional Implications: Prolonged oral transit Pharyngeal Phase Impairments: Suspected delayed Swallow   Puree Puree: Impaired Presentation: Spoon Oral Phase Functional Implications: Prolonged oral transit Pharyngeal Phase Impairments: Suspected delayed Swallow   Solid   GO   Solid: Not tested        Maxcine Ham 06/19/2017,3:31 PM   Maxcine Ham, M.A. CCC-SLP [phone]

## 2017-06-19 NOTE — Progress Notes (Addendum)
No charge note.   Consult received. Chart reviewed. Family unavailable today. Meeting scheduled for 2:30 5/1 with Stormy Fabian.  Thank you for this consult.  Gerlean Ren, DNP, AGNP-C Palliative Medicine Team Team Phone # [phone]

## 2017-06-19 NOTE — Clinical Social Work Note (Signed)
Clinical Social Work Assessment  Patient Details  Name: Alison Doyle MRN: [id number] Date of Birth: 04/02/1935  Date of referral:  06/19/17               Reason for consult:  Facility Placement, Discharge Planning                Permission sought to share information with:  Facility Medical sales representative, Family Supports Permission granted to share information::  No(patient disoriented)  Name::     Alison Doyle  Agency::  [facility]  Relationship::  son  Contact Information:  [phone]  Housing/Transportation Living arrangements for the past 2 months:  Skilled Nursing Facility Source of Information:  Adult Children Patient Interpreter Needed:  None Criminal Activity/Legal Involvement Pertinent to Current Situation/Hospitalization:  No - Comment as needed Significant Relationships:  Adult Children Lives with:  Facility Resident Do you feel safe going back to the place where you live?  Yes Need for family participation in patient care:  Yes (Comment)  Care giving concerns: Patient from [facility] SNF long term care. Not yet assessed by PT.  Social Worker assessment / plan: CSW spoke to patient's son [name], via phone; patient disoriented. [name] confirmed patient is a resident at [facility] and has stayed there since December. Son reported that patient had a stroke before going in to [facility]. Son does want patient to return to [facility], though was unable to pay for a bed hold. Palliative meeting is also planned for tomorrow afternoon.   Patient will likely need new PT/OT to go back to SNF. As patient's son not holding bed, may need to re-enter under rehab.  CSW following for disposition planning. Awaiting palliative and PT recommendations.  Employment status:  Retired Primary school teacher) PT Recommendations:  Not assessed at this time Information / Referral to community resources:  Skilled Nursing Facility  Patient/Family's Response  to care: Family appreciative of care.  Patient/Family's Understanding of and Emotional Response to Diagnosis, Current Treatment, and Prognosis: Son with understanding of patient's condition and hopeful for patient's return to [facility].  Emotional Assessment Appearance:  Appears stated age Attitude/Demeanor/Rapport:  Unable to Assess Affect (typically observed):  Unable to Assess Orientation:  Oriented to Self Alcohol / Substance use:  Not Applicable Psych involvement (Current and /or in the community):  No (Comment)  Discharge Needs  Concerns to be addressed:  Discharge Planning Concerns, Care Coordination Readmission within the last 30 days:  No Current discharge risk:  Physical Impairment, Cognitively Impaired Barriers to Discharge:  Continued Medical Work up   Abigail Butts, LCSW 06/19/2017, 3:56 PM

## 2017-06-19 NOTE — Consult Note (Signed)
[facility]    Date of Admission:  06/18/2017     Total days of antibiotics                Reason for Consult: Coag Neg Staph Bacteremia / Pneumonia.  Referring Provider: Renford Dills Primary Care Provider: [name], MD   Assessment/Plan:  Ms. Runions is a 82 y/o female with previous history of CVA and diabetes found to have fever and increased work of breathing at her SNF and now with Coag Negative Staph Bacteremia (2/4) and pneumonia. On Day 2 of Vancomycin and Cefepime. She is non-verbal secondary to her previous stroke making communication challenging. Her max temp in the past 24 hours was 100.9 and has improving leukocytosis. A palliative care consult has been ordered for goals of care. She is currently a DNR. Pressure ulcers appear without evidence of infection.   1. Continue vancomycin. Change cefepime to ceftriaxone. Goal treatment will be 7-10 days for total antimicrobial therapy. 2. Continue to keep pressure off of ulcers and turning q 2. 3. Contact precautions for positive MRSA nasal swab. 4. Will continue to follow.    Principal Problem:   Acute respiratory failure (HCC) Active Problems:   Sepsis (HCC)   Paroxysmal atrial fibrillation (HCC)   Diabetes mellitus (HCC)   HCAP (healthcare-associated pneumonia)   Decubital ulcer   Acute encephalopathy   . apixaban  5 mg Oral BID  . aspirin EC  81 mg Oral Daily  . Chlorhexidine Gluconate Cloth  6 each Topical Q0600  . insulin aspart  0-5 Units Subcutaneous QHS  . insulin aspart  0-9 Units Subcutaneous TID WC  . mupirocin ointment  1 application Nasal BID  . nicotine  21 mg Transdermal Daily  . scopolamine  1 patch Transdermal Q72H     HPI: NOLAH KRENZER is a 82 y.o. female with previous medical history of atrial fibrillation, CVA, diabetes, and sepsis who presented to the emergency department on 4/29 with increased work of breathing, altered mental status and fever from [facility].    Vital signs in the ED were consistent with sepsis. Her lactate level was >4. Chest x-ray was concerning for pneumonia and her O2 saturation on room air were 84%. She was also having Afib with RVR likely related to the pneumonia. Started on Cefepime and Vancomycin. Her max temp in the last 24 hours was 100.9. Leukocytosis is markedly improved from 21.2 to 12.2. She is on Day 2 of Cefepime and vancomycin. Blood cultures were positive for Coag Negative Staph in 2/4.  Review of Systems: Review of Systems  Unable to perform ROS: Patient nonverbal     Past Medical History:  Diagnosis Date  . A-fib (HCC)   . Acute respiratory failure (HCC)   . CVA (cerebral vascular accident) (HCC)   . Diabetes mellitus   . Epidural hematoma (HCC)   . HCAP (healthcare-associated pneumonia)   . SAH (subarachnoid hemorrhage) (HCC)   . Sepsis [facility])     Social History   Tobacco Use  . Smoking status: Current Every Day Smoker    Packs/day: 1.00    Types: Cigarettes  . Smokeless tobacco: Current User  Substance Use Topics  . Alcohol use: No  . Drug use: No    History reviewed. No pertinent family history.  No Known Allergies  OBJECTIVE: Blood pressure (!) 82/65, pulse (!) 118, temperature 97.8 F (36.6 C), temperature source Oral, resp. rate (!) 23, height  (1.676 m), weight  187 lb 6.4 oz (85 kg), SpO2 96 %.  Physical Exam  Constitutional: She appears cachectic. She appears ill.  Lying in bed  Cardiovascular: Normal rate, normal heart sounds and intact distal pulses. An irregularly irregular rhythm present. Exam reveals no gallop and no friction rub.  No murmur heard. Pulmonary/Chest: No stridor. No respiratory distress. She has no wheezes. She has no rales. She exhibits no tenderness.  Skin: Skin is dry.  Skin is cool to the touch. Stage 3 decubitis ulcer of the left gluteal with no odor or drainage. Unstagable ulcer of the left heel with no odor or drainage. No evidence of infection.      Lab Results Lab Results  Component Value Date   WBC 12.2 (H) 06/19/2017   HGB 14.9 06/19/2017   HCT 46.6 (H) 06/19/2017   MCV 90.7 06/19/2017   PLT 291 06/19/2017    Lab Results  Component Value Date   CREATININE 0.59 06/19/2017   BUN 19 06/19/2017   NA 137 06/19/2017   K 4.2 06/19/2017   CL 105 06/19/2017   CO2 25 06/19/2017    Lab Results  Component Value Date   ALT 18 06/18/2017   AST 22 06/18/2017   ALKPHOS 113 06/18/2017   BILITOT 0.6 06/18/2017     Microbiology: Recent Results (from the past 240 hour(s))  Blood Culture (routine x 2)     Status: None (Preliminary result)   Collection Time: 06/18/17  7:30 AM  Result Value Ref Range Status   Specimen Description BLOOD RIGHT ANTECUBITAL  Final   Special Requests   Final    BOTTLES DRAWN AEROBIC AND ANAEROBIC Blood Culture adequate volume   Culture  Setup Time   Final    IN BOTH AEROBIC AND ANAEROBIC BOTTLES Organism ID to follow GRAM POSITIVE COCCI CRITICAL RESULT CALLED TO, READ BACK BY AND VERIFIED WITH: Melven Sartorius [facility] 06/19/17 0304 JDW Performed at [facility], [address] [place], [place] [address]    Culture GRAM POSITIVE COCCI  Final   Report Status PENDING  Incomplete  Blood Culture ID Panel (Reflexed)     Status: Abnormal   Collection Time: 06/18/17  7:30 AM  Result Value Ref Range Status   Enterococcus species NOT DETECTED NOT DETECTED Final   Listeria monocytogenes NOT DETECTED NOT DETECTED Final   Staphylococcus species DETECTED (A) NOT DETECTED Final    Comment: Methicillin (oxacillin) susceptible coagulase negative staphylococcus. Possible blood culture contaminant (unless isolated from more than one blood culture draw or clinical case suggests pathogenicity). No antibiotic treatment is indicated for blood  culture contaminants. CRITICAL RESULT CALLED TO, READ BACK BY AND VERIFIED WITH: J LEDFORD PHARMD 06/19/17 0304 JDW    Staphylococcus aureus NOT DETECTED NOT DETECTED Final    Methicillin resistance NOT DETECTED NOT DETECTED Final   Streptococcus species NOT DETECTED NOT DETECTED Final   Streptococcus agalactiae NOT DETECTED NOT DETECTED Final   Streptococcus pneumoniae NOT DETECTED NOT DETECTED Final   Streptococcus pyogenes NOT DETECTED NOT DETECTED Final   Acinetobacter baumannii NOT DETECTED NOT DETECTED Final   Enterobacteriaceae species NOT DETECTED NOT DETECTED Final   Enterobacter cloacae complex NOT DETECTED NOT DETECTED Final   Escherichia coli NOT DETECTED NOT DETECTED Final   Klebsiella oxytoca NOT DETECTED NOT DETECTED Final   Klebsiella pneumoniae NOT DETECTED NOT DETECTED Final   Proteus species NOT DETECTED NOT DETECTED Final   Serratia marcescens NOT DETECTED NOT DETECTED Final   Haemophilus influenzae NOT DETECTED NOT DETECTED  Final   Neisseria meningitidis NOT DETECTED NOT DETECTED Final   Pseudomonas aeruginosa NOT DETECTED NOT DETECTED Final   Candida albicans NOT DETECTED NOT DETECTED Final   Candida glabrata NOT DETECTED NOT DETECTED Final   Candida krusei NOT DETECTED NOT DETECTED Final   Candida parapsilosis NOT DETECTED NOT DETECTED Final   Candida tropicalis NOT DETECTED NOT DETECTED Final    Comment: Performed at [facility], [address] [place], [place] [address]  Blood Culture (routine x 2)     Status: None (Preliminary result)   Collection Time: 06/18/17  9:33 AM  Result Value Ref Range Status   Specimen Description BLOOD LEFT HAND  Final   Special Requests   Final    BOTTLES DRAWN AEROBIC AND ANAEROBIC Blood Culture adequate volume   Culture   Final    NO GROWTH < 24 HOURS Performed at [facility], [address], [place], [place] [address]    Report Status PENDING  Incomplete  MRSA PCR Screening     Status: Abnormal   Collection Time: 06/18/17  8:38 PM  Result Value Ref Range Status   MRSA by PCR POSITIVE (A) NEGATIVE Final    Comment:        The GeneXpert MRSA Assay (FDA approved for NASAL  specimens only), is one component of a comprehensive MRSA colonization surveillance program. It is not intended to diagnose MRSA infection nor to guide or monitor treatment for MRSA infections. RESULT CALLED TO, READ BACK BY AND VERIFIED WITH: HALL RN 06/19/17 0306 JDW Performed at [facility], [address] [place], [place] [address]      Marcos Eke, NP [facility] [phone] Pager  06/19/2017  1:18 PM

## 2017-06-19 NOTE — Consult Note (Signed)
WOC consulted on this patient just yesterday in the ED, see notes and orders. .  Thanks  Iyan Flett [facility] MSN, RN,CWOCN, CNS, CWON-AP [phone])

## 2017-06-19 NOTE — Consult Note (Signed)
Consultation Note Date: 06/19/2017   Patient Name: Alison Doyle  DOB: 1935/09/07  MRN: [id number]  Age / Sex: 82 y.o., female  PCP: Deland Pretty, MD Referring Physician: Shelly Coss, MD  Reason for Consultation: Establishing goals of care  HPI/Patient Profile: 82 y.o. female  with past medical history of DM, sepsis, CVA w/ R hemiparesis, decubitus ulcers, and a fib admitted on 06/18/2017 with AMS and respiratory distress. Diagnosed with HCAP and septic shock. Baseline, she is bed bound and has been declining since a stroke in Nov 2018. She has a MOST form and DNR.  PMT consulted for [place].  Clinical Assessment and Goals of Care: I have reviewed medical records including EPIC notes, labs and imaging, received report from Dr. Erlinda Hong, assessed the patient and then met at the bedside along with patient's son, Alison Doyle,  to discuss diagnosis prognosis, GOC, EOL wishes, disposition and options.  Patient is unable to participate in [place] conversation d/t mental status. Alison Doyle tells me there are no other family members involved in the patient's care.  I introduced Palliative Medicine as specialized medical care for people living with serious illness. It focuses on providing relief from the symptoms and stress of a serious illness. The goal is to improve quality of life for both the patient and the family.  As far as functional and nutritional status, he tells me her current state is her baseline. Since her stroke in November, she has been bed bound and mostly nonverbal - occasionally speaks a few words. He tells me her nutritional status is poor.   We discussed their current illness and what it means in the larger context of their on-going co-morbidities. We discussed dysphagia and aspiration pneumonia and probability of recurrence. Natural disease trajectory and expectations at EOL were discussed.  The difference between aggressive medical intervention and  comfort care was considered in light of the patient's goals of care. Alison Doyle wants to focus on the patient's comfort. He tells me she has no quality of life and she would not want to live like this.  Advanced directives, concepts specific to code status, artifical feeding and hydration, and rehospitalization were considered and discussed. Confirmed patient is DNR. He tells me he would not want her to have PICC placed or feeding tube. He does not want her rehospitalized.  Hospice and Palliative Care services outpatient were explained and offered. He requests hospice services upon discharge back to SNF.   Questions and concerns were addressed. The family was encouraged to call with questions or concerns.   Primary Decision Maker NEXT OF KIN - son, Alison Doyle    SUMMARY OF RECOMMENDATIONS   -CSW consult for hospice at SNF upon discharge - no aggressive medical interventions  Code Status/Advance Care Planning:  DNR   Symptom Management:   Per primary  Palliative Prophylaxis:   Aspiration, Bowel Regimen, Delirium Protocol, Frequent Pain Assessment, Oral Care and Turn Reposition  Additional Recommendations (Limitations, Scope, Preferences):  Avoid Hospitalization, Initiate Comfort Feeding, No Artificial Feeding, No Diagnostics, No Lab Draws and No Surgical Procedures  Psycho-social/Spiritual:   Desire for further Chaplaincy support:no  Additional Recommendations: Education on Hospice  Prognosis:   < 6 months d/t dysphagia/aspiration pna risk, poor functional status, minimal PO intake  Discharge Planning: Branch with Hospice      Primary Diagnoses: Present on Admission: . Paroxysmal atrial fibrillation ([place]) . Acute respiratory failure ([place]) . HCAP (healthcare-associated pneumonia) . Sepsis ([place]) . Decubital ulcer . Acute encephalopathy   I have  reviewed the medical record, interviewed the patient and family, and examined the patient. The  following aspects are pertinent.  Past Medical History:  Diagnosis Date  . A-fib ([place])   . Acute respiratory failure ([place])   . CVA (cerebral vascular accident) ([place])   . Diabetes mellitus   . Epidural hematoma ([place])   . HCAP (healthcare-associated pneumonia)   . SAH (subarachnoid hemorrhage) ([place])   . Sepsis [facility])    Social History   Socioeconomic History  . Marital status: Widowed    Spouse name: Not on file  . Number of children: Not on file  . Years of education: Not on file  . Highest education level: Not on file  Occupational History  . Not on file  Social Needs  . Financial resource strain: Not on file  . Food insecurity:    Worry: Not on file    Inability: Not on file  . Transportation needs:    Medical: Not on file    Non-medical: Not on file  Tobacco Use  . Smoking status: Current Every Day Smoker    Packs/day: 1.00    Types: Cigarettes  . Smokeless tobacco: Current User  Substance and Sexual Activity  . Alcohol use: No  . Drug use: No  . Sexual activity: Not on file  Lifestyle  . Physical activity:    Days per week: Not on file    Minutes per session: Not on file  . Stress: Not on file  Relationships  . Social connections:    Talks on phone: Not on file    Gets together: Not on file    Attends religious service: Not on file    Active member of club or organization: Not on file    Attends meetings of clubs or organizations: Not on file    Relationship status: Not on file  Other Topics Concern  . Not on file  Social History Narrative  . Not on file   History reviewed. No pertinent family history. Scheduled Meds: . apixaban  5 mg Oral BID  . aspirin EC  81 mg Oral Daily  . Chlorhexidine Gluconate Cloth  6 each Topical Q0600  . insulin aspart  0-5 Units Subcutaneous QHS  . insulin aspart  0-9 Units Subcutaneous TID WC  . mupirocin ointment  1 application Nasal BID  . nicotine  21 mg Transdermal Daily  . scopolamine  1 patch Transdermal Q72H    Continuous Infusions: . sodium chloride 1,000 mL (06/18/17 0743)  . ceFEPime (MAXIPIME) IV Stopped (06/19/17 1017)  . diltiazem (CARDIZEM) infusion 5 mg/hr (06/18/17 1252)  . sodium chloride    . vancomycin 1,000 mg (06/19/17 0935)   PRN Meds:.acetaminophen **OR** acetaminophen, diltiazem, morphine injection, ondansetron **OR** ondansetron (ZOFRAN) IV, senna-docusate No Known Allergies Review of Systems  Unable to perform ROS: Patient nonverbal    Physical Exam  Constitutional: She appears ill.  HENT:  Head: Normocephalic and atraumatic.  Cardiovascular: An irregular rhythm present.  Pulmonary/Chest: No accessory muscle usage. No tachypnea. No respiratory distress.  Abdominal: Soft.  Genitourinary:  Genitourinary Comments: Amber urine  Musculoskeletal:       Right lower leg: She exhibits edema.       Left lower leg: She exhibits edema.  Neurological:  Makes eye contact, does not speak or follow commands  Skin: Skin is warm and dry.  Psychiatric: She is noncommunicative.    Vital Signs: BP (!) 82/65 (BP Location: Right Arm)   Pulse (!) 118   Temp  97.8 F (36.6 C) (Oral)   Resp (!) 23   Ht '5\' 6"'$  (1.676 m)   Wt 85 kg (187 lb 6.4 oz)   SpO2 (!) 86%   BMI 30.25 kg/m  Pain Scale: Faces   Pain Score: 0-No pain   SpO2: SpO2: (!) 86 % O2 Device:SpO2: (!) 86 % O2 Flow Rate: .O2 Flow Rate (L/min): 2.5 L/min  IO: Intake/output summary:   Intake/Output Summary (Last 24 hours) at 06/19/2017 1230 Last data filed at 06/19/2017 1017 Gross per 24 hour  Intake 3320.42 ml  Output 250 ml  Net 3070.42 ml    LBM: Last BM Date: 06/18/17 Baseline Weight: Weight: 106.6 kg (235 lb) Most recent weight: Weight: 85 kg (187 lb 6.4 oz)     Palliative Assessment/Data: PPS 20%     Time In: 1430 Time Out: 1530 Time Total: 60 minutes Greater than 50%  of this time was spent counseling and coordinating care related to the above assessment and plan.  Juel Burrow, DNP,  AGNP-C Palliative Medicine Team [phone]

## 2017-06-20 DIAGNOSIS — B9689 Other specified bacterial agents as the cause of diseases classified elsewhere: Secondary | ICD-10-CM

## 2017-06-20 DIAGNOSIS — R1310 Dysphagia, unspecified: Secondary | ICD-10-CM

## 2017-06-20 DIAGNOSIS — Z7189 Other specified counseling: Secondary | ICD-10-CM

## 2017-06-20 DIAGNOSIS — Z515 Encounter for palliative care: Secondary | ICD-10-CM

## 2017-06-20 DIAGNOSIS — J9601 Acute respiratory failure with hypoxia: Secondary | ICD-10-CM

## 2017-06-20 LAB — GLUCOSE, CAPILLARY
GLUCOSE-CAPILLARY: 69 mg/dL (ref 65–99)
GLUCOSE-CAPILLARY: 123 mg/dL — AB (ref 65–99)
GLUCOSE-CAPILLARY: 73 mg/dL (ref 65–99)
Glucose-Capillary: 52 mg/dL — ABNORMAL LOW (ref 65–99)
Glucose-Capillary: 40 mg/dL — CL (ref 65–99)
Glucose-Capillary: 50 mg/dL — ABNORMAL LOW (ref 65–99)
Glucose-Capillary: 70 mg/dL (ref 65–99)

## 2017-06-20 LAB — CBC WITH DIFFERENTIAL/PLATELET
Basophils Absolute: 0 10*3/uL (ref 0.0–0.1)
Basophils Relative: 0 %
EOS PCT: 1 %
Eosinophils Absolute: 0.1 10*3/uL (ref 0.0–0.7)
HCT: 47.6 % — ABNORMAL HIGH (ref 36.0–46.0)
Hemoglobin: 15.6 g/dL — ABNORMAL HIGH (ref 12.0–15.0)
LYMPHS ABS: 2.2 10*3/uL (ref 0.7–4.0)
LYMPHS PCT: 20 %
MCH: 29.7 pg (ref 26.0–34.0)
MCHC: 32.8 g/dL (ref 30.0–36.0)
MCV: 90.5 fL (ref 78.0–100.0)
MONOS PCT: 4 %
Monocytes Absolute: 0.5 10*3/uL (ref 0.1–1.0)
Neutro Abs: 8.3 10*3/uL — ABNORMAL HIGH (ref 1.7–7.7)
Neutrophils Relative %: 75 %
PLATELETS: 332 10*3/uL (ref 150–400)
RBC: 5.26 MIL/uL — AB (ref 3.87–5.11)
RDW: 15.9 % — ABNORMAL HIGH (ref 11.5–15.5)
WBC: 11.1 10*3/uL — AB (ref 4.0–10.5)

## 2017-06-20 LAB — LACTIC ACID, PLASMA: Lactic Acid, Venous: 1.9 mmol/L (ref 0.5–1.9)

## 2017-06-20 MED ORDER — GLUCOSE 40 % PO GEL
ORAL | Status: AC
  Administered 2017-06-20: 1
  Filled 2017-06-20: qty 1

## 2017-06-20 MED ORDER — GLUCAGON HCL RDNA (DIAGNOSTIC) 1 MG IJ SOLR
1.0000 mg | Freq: Once | INTRAMUSCULAR | Status: AC | PRN
  Administered 2017-06-20: 1 mg via INTRAMUSCULAR
  Filled 2017-06-20: qty 1

## 2017-06-20 MED ORDER — GLUCOSE 40 % PO GEL
ORAL | Status: AC
  Administered 2017-06-20: 11:00:00
  Filled 2017-06-20: qty 1

## 2017-06-20 MED ORDER — DEXTROSE 5 % IV SOLN
INTRAVENOUS | Status: AC
  Administered 2017-06-20: 12:00:00 via INTRAVENOUS

## 2017-06-20 MED ORDER — DEXTROSE 50 % IV SOLN
INTRAVENOUS | Status: AC
  Filled 2017-06-20: qty 50

## 2017-06-20 NOTE — Progress Notes (Signed)
CSW consulted for patient's return to SNF with hospice services following. Patient is now full comfort care.   CSW met with patient's sister and her husband, as well as family friend at bedside. CSW also spoke to son Erlene Quan via phone. Family all in agreement for patient to return to [facility] with hospice.   CSW confirmed patient will have bed at [facility] and informed facility patient will need hospice following. Son will complete new admissions paperwork at [facility] tomorrow.  CSW to follow and support with discharge tomorrow.  Alison Doyle, [place]

## 2017-06-20 NOTE — Progress Notes (Signed)
PROGRESS NOTE  Alison Doyle ZOX:[id number] DOB: [birth year] DOA: 06/18/2017 PCP: [name], MD  Brief Narrative: Patient is a 82 year old female with past medical history significant for a stroke with left-sided hemiparesis, atrial  fibrillation, diabetes and decubitus ulcers who was sent to the emergency department from  [facility] with fever and shortness of breath. Patient was found to have pneumonia on presentation and also A. fib with RVR.Started on broad-spectrum antibiotics.  Start on Cardizem for A. fib with RVR. Both 2 sets of  Blood cultures growing methicillin sensitive coagulase-negative staph .  ID following.  Palliative care also following for low threshold for transitioning he care to comfort.     HPI/Recap of past 24 hours:  Has hypoglycemia event Patient does not talk, does not open her mouth for me She has limited iv access  Assessment/Plan: Principal Problem:   Acute respiratory failure (HCC) Active Problems:   Sepsis (HCC)   Paroxysmal atrial fibrillation (HCC)   Diabetes mellitus (HCC)   HCAP (healthcare-associated pneumonia)   Decubital ulcer   Acute encephalopathy   Palliative care by specialist   Goals of care, counseling/discussion   Dysphagia  Acute respiratory failure:Secondary to healthcare respiratory pneumonia.  She was saturating 84% on room air on presentation.  Currently saturating fine on oxygen via nasal cannula.  Sepsis secondary to healthcare associated pneumonia: Nursing home resident.  Chest x-ray was concerning for pneumonia.Showed patchy bibasilar opacities, left greater than right.Presented with fever, leukocytosis and lactic acidosis.  Started on broad-spectrum antibiotics.  Currently on ceftriaxone and vancomycin. She was hypotensive on presentation.  Aggressively fluid resuscitated on presentation. Continue IV fluids.  We will continue to monitor lactic acid level. BP on the lower side.  Bacteremia: 2 sets  of blood culture growing methicillin sensitive coagulase-negative staph .  ID consulted.  Recommended to continue current antibiotics.  Will follow final blood culture report.  A. fib with RVR: Was started on Cardizem on presentation.  Currently heart rate stable, we will resume her home Cardizem after her BP improves..  Also on Eliquis for anticoagulation.  Diabetes mellitus: On Lantus and sliding-scale insulin at home along with metformin.  Continue sliding-scale insulin here.  Altered mental status: Secondary to metabolic encephalopathy.  She was reportedly unresponsive at the facility. Much improved now. She is nonverbal on baseline from her previous stroke.  During my evaluation today she looks comfortable. Patient suspected to have severe aspiration risk .Speech recommended n.p.o.  Decubitus ulcer on the left heel and sacrum: Wound care consulted.  Goals of care/poor prognosis/advanced age/multiple comorbidities: Admitting provider discussed with the grandson.    comfort care per Palliative care communication Discharge to snf with hospice tomorrow  DVT prophylaxis: Eliquis Code Status: DNR Family Communication: palliative care had family meeting today Disposition Plan: hospice per palliative care    Consultants: Palliative care, ID  Procedures: None  Antimicrobials: Vancomycin day 2, ceftriaxone day 1    Objective: BP 100/90 (BP Location: Right Wrist)   Pulse 95   Temp 97.8 F (36.6 C) (Axillary)   Resp 20   Ht  (1.676 m)   Wt 85 kg (187 lb 6.4 oz)   SpO2 98%   BMI 30.25 kg/m   Intake/Output Summary (Last 24 hours) at 06/20/2017 1746 Last data filed at 06/20/2017 1600 Gross per 24 hour  Intake 3388.33 ml  Output 200 ml  Net 3188.33 ml   Filed Weights   06/18/17 0724 06/18/17 1745 06/19/17 0346  Weight: 106.6 kg (  235 lb) 85.5 kg (188 lb 9.6 oz) 85 kg (187 lb 6.4 oz)    Exam: Patient is examined daily including today on 06/20/2017, exams remain the  same as of yesterday except that has changed    General:  Frail, does not talk, does not open mouth  Cardiovascular: IRRR  Respiratory: diminished at basis  Abdomen: Soft/ND/NT, positive BS  Musculoskeletal: bilateral heal protectors on, left hemiparesis, contractures  Neuro: alert, does not talk, does not follow commands  Data Reviewed: Basic Metabolic Panel: Recent Labs  Lab 06/18/17 0730 06/19/17 0952  NA 136 137  K 4.6 4.2  CL 103 105  CO2 23 25  GLUCOSE 208* 119*  BUN 20 19  CREATININE 0.68 0.59  CALCIUM 8.3* 8.1*   Liver Function Tests: Recent Labs  Lab 06/18/17 0730  AST 22  ALT 18  ALKPHOS 113  BILITOT 0.6  PROT 5.6*  ALBUMIN 2.1*   No results for input(s): LIPASE, AMYLASE in the last 168 hours. No results for input(s): AMMONIA in the last 168 hours. CBC: Recent Labs  Lab 06/18/17 0730 06/19/17 0952 06/20/17 0627  WBC 21.2* 12.2* 11.1*  NEUTROABS 18.9*  --  8.3*  HGB 17.1* 14.9 15.6*  HCT 52.4* 46.6* 47.6*  MCV 91.4 90.7 90.5  PLT 348 291 332   Cardiac Enzymes:   Recent Labs  Lab 06/18/17 0730  TROPONINI <0.03   BNP (last 3 results) Recent Labs    06/18/17 0724  BNP 286.1*    ProBNP (last 3 results) No results for input(s): PROBNP in the last 8760 hours.  CBG: Recent Labs  Lab 06/20/17 0927 06/20/17 1025 06/20/17 1146 06/20/17 1306 06/20/17 1614  GLUCAP 40* 69 50* 70 123*    Recent Results (from the past 240 hour(s))  Blood Culture (routine x 2)     Status: Abnormal (Preliminary result)   Collection Time: 06/18/17  7:30 AM  Result Value Ref Range Status   Specimen Description BLOOD RIGHT ANTECUBITAL  Final   Special Requests   Final    BOTTLES DRAWN AEROBIC AND ANAEROBIC Blood Culture adequate volume   Culture  Setup Time   Final    Organism ID to follow GRAM POSITIVE COCCI CRITICAL RESULT CALLED TO, READ BACK BY AND VERIFIED WITH: J LEDFORD PHARMD 06/19/17 0304 JDW IN BOTH AEROBIC AND ANAEROBIC BOTTLES    Culture  (A)  Final    ENTEROCOCCUS FAECALIS SUSCEPTIBILITIES TO FOLLOW Performed at [facility], [address], [place], [place] [address]    Report Status PENDING  Incomplete  Blood Culture ID Panel (Reflexed)     Status: Abnormal   Collection Time: 06/18/17  7:30 AM  Result Value Ref Range Status   Enterococcus species NOT DETECTED NOT DETECTED Final   Listeria monocytogenes NOT DETECTED NOT DETECTED Final   Staphylococcus species DETECTED (A) NOT DETECTED Final    Comment: Methicillin (oxacillin) susceptible coagulase negative staphylococcus. Possible blood culture contaminant (unless isolated from more than one blood culture draw or clinical case suggests pathogenicity). No antibiotic treatment is indicated for blood  culture contaminants. CRITICAL RESULT CALLED TO, READ BACK BY AND VERIFIED WITH: J LEDFORD PHARMD 06/19/17 0304 JDW    Staphylococcus aureus NOT DETECTED NOT DETECTED Final   Methicillin resistance NOT DETECTED NOT DETECTED Final   Streptococcus species NOT DETECTED NOT DETECTED Final   Streptococcus agalactiae NOT DETECTED NOT DETECTED Final   Streptococcus pneumoniae NOT DETECTED NOT DETECTED Final   Streptococcus pyogenes NOT DETECTED NOT DETECTED  Final   Acinetobacter baumannii NOT DETECTED NOT DETECTED Final   Enterobacteriaceae species NOT DETECTED NOT DETECTED Final   Enterobacter cloacae complex NOT DETECTED NOT DETECTED Final   Escherichia coli NOT DETECTED NOT DETECTED Final   Klebsiella oxytoca NOT DETECTED NOT DETECTED Final   Klebsiella pneumoniae NOT DETECTED NOT DETECTED Final   Proteus species NOT DETECTED NOT DETECTED Final   Serratia marcescens NOT DETECTED NOT DETECTED Final   Haemophilus influenzae NOT DETECTED NOT DETECTED Final   Neisseria meningitidis NOT DETECTED NOT DETECTED Final   Pseudomonas aeruginosa NOT DETECTED NOT DETECTED Final   Candida albicans NOT DETECTED NOT DETECTED Final   Candida glabrata NOT DETECTED NOT DETECTED Final    Candida krusei NOT DETECTED NOT DETECTED Final   Candida parapsilosis NOT DETECTED NOT DETECTED Final   Candida tropicalis NOT DETECTED NOT DETECTED Final    Comment: Performed at [facility], [address] [place], [place] [address]  Blood Culture (routine x 2)     Status: None (Preliminary result)   Collection Time: 06/18/17  9:33 AM  Result Value Ref Range Status   Specimen Description BLOOD LEFT HAND  Final   Special Requests   Final    BOTTLES DRAWN AEROBIC AND ANAEROBIC Blood Culture adequate volume   Culture   Final    NO GROWTH 2 DAYS Performed at [facility], [address], [place], [place] [address]    Report Status PENDING  Incomplete  MRSA PCR Screening     Status: Abnormal   Collection Time: 06/18/17  8:38 PM  Result Value Ref Range Status   MRSA by PCR POSITIVE (A) NEGATIVE Final    Comment:        The GeneXpert MRSA Assay (FDA approved for NASAL specimens only), is one component of a comprehensive MRSA colonization surveillance program. It is not intended to diagnose MRSA infection nor to guide or monitor treatment for MRSA infections. RESULT CALLED TO, READ BACK BY AND VERIFIED WITH: HALL RN 06/19/17 0306 JDW Performed at [facility], [address] [place], [place] [address]      Studies: No results found.  Scheduled Meds: . apixaban  5 mg Oral BID  . aspirin EC  81 mg Oral Daily  . Chlorhexidine Gluconate Cloth  6 each Topical Q0600  . dextrose      . insulin aspart  0-5 Units Subcutaneous QHS  . insulin aspart  0-9 Units Subcutaneous TID WC  . mupirocin ointment  1 application Nasal BID  . nicotine  21 mg Transdermal Daily  . scopolamine  1 patch Transdermal Q72H    Continuous Infusions: . sodium chloride 1,000 mL (06/19/17 1836)  . cefTRIAXone (ROCEPHIN)  IV    . dextrose 50 mL/hr at 06/20/17 1214  . diltiazem (CARDIZEM) infusion Stopped (06/20/17 1600)  . sodium chloride    . vancomycin Stopped (06/19/17 2145)      Time spent: , case discussed with palliative care and social worker I have personally reviewed and interpreted on  06/20/2017 daily labs, tele strips, imagings as discussed above under date review session and assessment and plans.  I reviewed all nursing notes, pharmacy notes, consultant notes,  vitals, pertinent old records  I have discussed plan of care as described above with RN , patient  on 06/20/2017   Albertine Grates MD, PhD  [facility] Pager [phone]. If 7PM-7AM, please contact night-coverage at [email or web address], password [facility] 06/20/2017, 5:46 PM  LOS: 2 days

## 2017-06-20 NOTE — NC FL2 (Signed)
[place] MEDICAID FL2 LEVEL OF CARE SCREENING TOOL     IDENTIFICATION  Patient Name: Alison Doyle Birthdate: September 09, 1935 Sex: female Admission Date (Current Location): 06/18/2017  [facility] and [place] Number:  Producer, television/film/video and Address:  [facility] [place], [address], [place], [place] [address]      Provider Number: [id number]  Attending Physician Name and Address:  Albertine Grates, MD  Relative Name and Phone Number:  Alison Doyle, son, [phone]    Current Level of Care: Hospital Recommended Level of Care: Skilled Nursing Facility Prior Approval Number:    Date Approved/Denied:   PASRR Number: [id number]  Discharge Plan: SNF    Current Diagnoses: Patient Active Problem List   Diagnosis Date Noted  . Acute respiratory failure (HCC) 06/18/2017  . HCAP (healthcare-associated pneumonia) 06/18/2017  . Atrial fibrillation with RVR (HCC) 06/18/2017  . Decubital ulcer 06/18/2017  . Acute encephalopathy 06/18/2017  . Coagulase negative Staphylococcus bacteremia 12/30/2016  . Subarachnoid hemorrhage 12/28/2016  . Acute pain of right shoulder   . Shoulder pain, left   . Epidural hematoma (HCC)   . Cerebrovascular accident (CVA) due to embolism of right middle cerebral artery (HCC)   . Paroxysmal atrial fibrillation (HCC)   . Diabetes mellitus (HCC)   . Sepsis (HCC) 12/26/2016  . Toxic encephalopathy     Orientation RESPIRATION BLADDER Height & Weight     Self  O2(nasal cannula 1.5-2L) Incontinent, External catheter Weight: 187 lb 6.4 oz (85 kg) Height:   (167.6 cm)  BEHAVIORAL SYMPTOMS/MOOD NEUROLOGICAL BOWEL NUTRITION STATUS      Continent Diet(please see DC summary)  AMBULATORY STATUS COMMUNICATION OF NEEDS Skin   (baseline) Verbally PU Stage and Appropriate Care(PU stage III sacrum; PU unstageable L heel - foam dressings)                       Personal Care Assistance Level of Assistance  Bathing, Feeding,  Dressing(baseline)           Functional Limitations Info  Sight, Hearing, Speech Sight Info: Adequate Hearing Info: Adequate Speech Info: Adequate    SPECIAL CARE FACTORS FREQUENCY                       Contractures      Additional Factors Info  Code Status, Allergies, Isolation Precautions, Insulin Sliding Scale Code Status Info: DNR Allergies Info: No Known Allergies   Insulin Sliding Scale Info: novolog 3x/day with meals and at bedtime Isolation Precautions Info: contact precautions, MRSA     Current Medications (06/20/2017):  This is the current hospital active medication list Current Facility-Administered Medications  Medication Dose Route Frequency Provider Last Rate Last Dose  . 0.9 %  sodium chloride infusion  1,000 mL Intravenous Continuous Gwenyth Bender, NP 125 mL/hr at 06/19/17 1836 1,000 mL at 06/19/17 1836  . acetaminophen (TYLENOL) tablet 650 mg  650 mg Oral Q6H PRN Gwenyth Bender, NP       Or  . acetaminophen (TYLENOL) suppository 650 mg  650 mg Rectal Q6H PRN Gwenyth Bender, NP      . apixaban (ELIQUIS) tablet 5 mg  5 mg Oral BID Gwenyth Bender, NP   5 mg at 06/20/17 1029  . aspirin EC tablet 81 mg  81 mg Oral Daily Gwenyth Bender, NP   81 mg at 06/20/17 1029  . cefTRIAXone (ROCEPHIN) 2 g in sodium chloride 0.9 %  100 mL IVPB  2 g Intravenous Q24H Burnadette Pop, MD      . Chlorhexidine Gluconate Cloth 2 % PADS 6 each  6 each Topical Q0600 Jonah Blue, MD   6 each at 06/20/17 [phone]  . dextrose 50 % solution           . diltiazem (CARDIZEM) 100 mg in dextrose 5% (1 mg/mL) infusion  5-15 mg/hr Intravenous Continuous Gwenyth Bender, NP 5 mL/hr at 06/19/17 1836 5 mg/hr at 06/19/17 1836  . diltiazem (CARDIZEM) injection 10 mg  10 mg Intravenous Q6H PRN Jonah Blue, MD      . insulin aspart (novoLOG) injection 0-5 Units  0-5 Units Subcutaneous QHS Black, Karen M, NP      . insulin aspart (novoLOG) injection 0-9 Units  0-9 Units Subcutaneous TID WC  Black, Lesle Chris, NP      . morphine 4 MG/ML injection 2 mg  2 mg Intravenous Q2H PRN Gwenyth Bender, NP   2 mg at 06/20/17 0555  . mupirocin ointment (BACTROBAN) 2 % 1 application  1 application Nasal BID Jonah Blue, MD   1 application at 06/20/17 1029  . nicotine (NICODERM CQ - dosed in mg/24 hours) patch 21 mg  21 mg Transdermal Daily Gwenyth Bender, NP   21 mg at 06/20/17 1030  . ondansetron (ZOFRAN) tablet 4 mg  4 mg Oral Q6H PRN Gwenyth Bender, NP       Or  . ondansetron [employer]) injection 4 mg  4 mg Intravenous Q6H PRN Gwenyth Bender, NP   4 mg at 06/18/17 1609  . scopolamine (TRANSDERM-SCOP) 1 MG/3DAYS 1.5 mg  1 patch Transdermal Q72H Gwenyth Bender, NP   1.5 mg at 06/18/17 2031  . senna-docusate (Senokot-S) tablet 1 tablet  1 tablet Oral QHS PRN Black, Karen M, NP      . sodium chloride 0.9 % bolus 1,000 mL  1,000 mL Intravenous Once Black, Karen M, NP      . vancomycin (VANCOCIN) IVPB 1000 mg/200 mL premix  1,000 mg Intravenous Q12H Gwenyth Bender, NP   Stopped at 06/19/17 2145     Discharge Medications: Please see discharge summary for a list of discharge medications.  Relevant Imaging Results:  Relevant Lab Results:   Additional Information SSN: [id number]  Abigail Butts, LCSW

## 2017-06-20 NOTE — Discharge Instructions (Signed)

## 2017-06-20 NOTE — Progress Notes (Signed)
SLP Cancellation Note  Patient Details Name: Alison Doyle MRN: [id number] DOB: 1936/02/15   Cancelled treatment:       Reason Eval/Treat Not Completed: Other (comment) Per chart review and discussion with RN, pt will be transitioning to comfort care and opting for comfort feeds. Additional SLP intervention not indicated, will sign off.  Maxcine Ham 06/20/2017, 4:45 PM  Maxcine Ham, M.A. CCC-SLP [phone]

## 2017-06-21 DIAGNOSIS — G9340 Encephalopathy, unspecified: Secondary | ICD-10-CM

## 2017-06-21 DIAGNOSIS — F0150 Vascular dementia without behavioral disturbance: Secondary | ICD-10-CM

## 2017-06-21 DIAGNOSIS — A419 Sepsis, unspecified organism: Secondary | ICD-10-CM

## 2017-06-21 DIAGNOSIS — R1319 Other dysphagia: Secondary | ICD-10-CM

## 2017-06-21 LAB — CULTURE, BLOOD (ROUTINE X 2): SPECIAL REQUESTS: ADEQUATE

## 2017-06-21 LAB — GLUCOSE, CAPILLARY
GLUCOSE-CAPILLARY: 143 mg/dL — AB (ref 65–99)
GLUCOSE-CAPILLARY: 20 mg/dL — AB (ref 65–99)
GLUCOSE-CAPILLARY: 449 mg/dL — AB (ref 65–99)
GLUCOSE-CAPILLARY: 60 mg/dL — AB (ref 65–99)
GLUCOSE-CAPILLARY: 62 mg/dL — AB (ref 65–99)
GLUCOSE-CAPILLARY: 81 mg/dL (ref 65–99)
Glucose-Capillary: 61 mg/dL — ABNORMAL LOW (ref 65–99)
Glucose-Capillary: 61 mg/dL — ABNORMAL LOW (ref 65–99)
Glucose-Capillary: 73 mg/dL (ref 65–99)
Glucose-Capillary: 79 mg/dL (ref 65–99)

## 2017-06-21 MED ORDER — GLUCOSE 40 % PO GEL
ORAL | Status: AC
  Filled 2017-06-21: qty 1

## 2017-06-21 MED ORDER — TRAMADOL HCL 50 MG PO TABS
50.0000 mg | ORAL_TABLET | Freq: Four times a day (QID) | ORAL | Status: DC | PRN
  Filled 2017-06-21: qty 1

## 2017-06-21 MED ORDER — NYSTATIN 100000 UNIT/ML MT SUSP
5.0000 mL | Freq: Four times a day (QID) | OROMUCOSAL | Status: DC
Start: 2017-06-21 — End: 2017-06-21
  Administered 2017-06-21: 500000 [IU] via ORAL
  Filled 2017-06-21: qty 5

## 2017-06-21 MED ORDER — AMOXICILLIN 500 MG PO CAPS
500.0000 mg | ORAL_CAPSULE | Freq: Three times a day (TID) | ORAL | Status: DC
  Administered 2017-06-21: 500 mg via ORAL
  Filled 2017-06-21 (×3): qty 1

## 2017-06-21 MED ORDER — AMPICILLIN 500 MG PO CAPS: 500.0000 mg | ORAL_CAPSULE | Freq: Four times a day (QID) | ORAL | 0 refills | Status: DC

## 2017-06-21 MED ORDER — TRAMADOL HCL 50 MG PO TABS: 50.0000 mg | ORAL_TABLET | Freq: Four times a day (QID) | ORAL | 0 refills | Status: AC | PRN

## 2017-06-21 MED ORDER — GLUCOSE 40 % PO GEL
1.0000 | Freq: Once | ORAL | Status: AC | PRN
  Administered 2017-06-21: 37.5 g via ORAL

## 2017-06-21 MED ORDER — CHLORHEXIDINE GLUCONATE CLOTH 2 % EX PADS: 6.0000 | MEDICATED_PAD | Freq: Every day | CUTANEOUS | 0 refills | Status: AC

## 2017-06-21 MED ORDER — AMOXICILLIN 500 MG PO CAPS: 500.0000 mg | ORAL_CAPSULE | Freq: Three times a day (TID) | ORAL | 0 refills | Status: AC

## 2017-06-21 MED ORDER — KETOROLAC TROMETHAMINE 15 MG/ML IJ SOLN
15.0000 mg | Freq: Once | INTRAMUSCULAR | Status: AC
  Administered 2017-06-21: 15 mg via INTRAVENOUS
  Filled 2017-06-21: qty 1

## 2017-06-21 MED ORDER — MUPIROCIN 2 % EX OINT: 1.0000 "application " | TOPICAL_OINTMENT | Freq: Two times a day (BID) | CUTANEOUS | 0 refills | Status: AC

## 2017-06-21 MED ORDER — DEXTROSE 50 % IV SOLN
INTRAVENOUS | Status: AC
  Filled 2017-06-21: qty 50

## 2017-06-21 MED ORDER — NYSTATIN 100000 UNIT/ML MT SUSP: 5.0000 mL | Freq: Four times a day (QID) | OROMUCOSAL | 0 refills | Status: AC

## 2017-06-21 MED ORDER — AMPICILLIN 500 MG PO CAPS
500.0000 mg | ORAL_CAPSULE | Freq: Four times a day (QID) | ORAL | Status: DC
  Filled 2017-06-21 (×2): qty 1

## 2017-06-21 NOTE — Discharge Summary (Addendum)
Discharge Summary  Alison Doyle:[id number] DOB: Oct 14, 1935  PCP: [name], MD  Admit date: 06/18/2017 Discharge date: 06/21/2017  Time spent: , more than 50% time spent on coordination of care.  Recommendations for Outpatient Follow-up:  1. Return to snf with hospice care  Discharge Diagnoses:  Active Hospital Problems   Diagnosis Date Noted  . Acute respiratory failure (HCC) 06/18/2017  . Palliative care by specialist   . Goals of care, counseling/discussion   . Dysphagia   . HCAP (healthcare-associated pneumonia) 06/18/2017  . Decubital ulcer 06/18/2017  . Acute encephalopathy 06/18/2017  . Diabetes mellitus (HCC)   . Paroxysmal atrial fibrillation (HCC)   . Sepsis (HCC) 12/26/2016    Resolved Hospital Problems  No resolved problems to display.    Discharge Condition: stable  Diet recommendation: heart healthy/carb modified, soft diet, aspiration precaution, pill to  Be crushed  Filed Weights   06/18/17 0724 06/18/17 1745 06/19/17 0346  Weight: 106.6 kg (235 lb) 85.5 kg (188 lb 9.6 oz) 85 kg (187 lb 6.4 oz)    History of present illness: (per admitting MD Dr Ophelia Charter) PCP: [name], MD Patient coming from: [facility]  Chief Complaint: sob/fever  HPI: Alison Doyle is a 82 y.o. female with medical history significant for stroke with left hemiparesis,A. Fib, diabetes ecubitus ulcers, presents to the emergency department with the chief complaint of fever shortness of breath. . Initial evaluation reveals acute respiratory failure likely related to healthcare associated pneumonia in the setting of atrial fibrillation with rapid ventricular response and sepsis. [facility] are asked to admit  Information is obtained from the chart and the grandson who is at the bedside. He states in November patient had a stroke was transferred to facility has basically been bed bound since then. He said recently she had become more responsive and communicative.  Reportedly she was found in respiratory distress at the facility this morning. grandson says he was called and told that her "vital signs were failing". Is been no report complaints of pain but grandson indicates she has been developing a cough. Unclear if it was productive. Staff also reported she was "unresponsive" according to grandson. She is incontinent of stool and bowel and mostly bedbound. He states she was unable to make her wants and needs known. EMS was called she was placed on a nonrebreather and transported to the hospital.    ED Course: in the emergency department max temperatures 100.9 he has tachypnea, hypoxia, atrial fibrillation with rapid ventricular response leukocytosis, elevated lactic acid. She is provided with IV fluids BiPAP and IV antibiotics.    Hospital Course:  Principal Problem:   Acute respiratory failure (HCC) Active Problems:   Sepsis (HCC)   Paroxysmal atrial fibrillation (HCC)   Diabetes mellitus (HCC)   HCAP (healthcare-associated pneumonia)   Decubital ulcer   Acute encephalopathy   Palliative care by specialist   Goals of care, counseling/discussion   Dysphagia   Acute respiratory failure:Secondary to healthcare respiratory pneumonia.She was saturating 84% on room air on presentation. Currently saturating fine on oxygen via nasal cannula 2liters, no cough.  Sepsis secondary to healthcare associated pneumonia/enterococus bacteremia ( ampicillin sensitive): Nursing home resident. Chest x-ray was concerning for pneumonia.Showed patchy bibasilar opacities, left greater than right.Presented with fever,leukocytosis and lactic acidosis. Started on broad-spectrum antibiotics.Currently on ceftriaxone and vancomycin. She was hypotensive on presentation. Aggressively fluid resuscitated on presentation. She received IV fluids and abx, lactic acid normalized. BP low normal.  She is discharged on oral amoxicillin.  She has poor iv access, family  decided on picc line, family decided to start hospice care. She is discharged to snf with hospice care.   oral thrush: topical nystatin  A. fib with RVR:Was started on Cardizem on presentation.  Currently heart rate stable, resume her home Cardizem . Also on Eliquis for anticoagulation.  family decided to start hospice care.  Diabetes mellitus:  Previously on insulin a1c5 She has hypoglycemia episode in the hospital , she does not eat consistently  family decided to start hospice care.  Altered mental status: Secondary to metabolic encephalopathy. She was reportedly unresponsive at the facility. Much improved now.She is nonverbal on baseline from her previous stroke.During my evaluation today she looks comfortable. Patient suspectedto havesevere aspiration risk.  family decided to start hospice care.  Decubitus ulcer on the left heel and sacrum:Wound care consulted.  Goals of care/poor prognosis/advanced age/multiple comorbidities: Admitting provider discussed with the grandson.  comfort care per Palliative care communication Discharge to snf with hospice    Chronic pain: she did not requir much prn analgesics while in the hospital. Her home prn tylenol and ultram.  Continued. Can consider low dose fentanyl patch if patient can not take oral meds moving forward.  DVT prophylaxis:Eliquis Code Status:DNR Family Communication:palliative care had family meeting on 5/1 Disposition Plan:hospice per palliative care    Consultants:Palliative care, ID  Procedures:None  Antimicrobials:Vancomycin day 2, ceftriaxone day 1   Discharge Exam: BP 107/83 (BP Location: Right Arm)   Pulse 96   Temp 98.9 F (37.2 C) (Oral)   Resp 16   Ht 5\' 6"  (1.676 m)   Wt 85 kg (187 lb 6.4 oz)   SpO2 98%   BMI 30.25 kg/m   General: frail, does talk a few words, today she opened her mouth when asked to, + thrush Cardiovascular: IRRR Respiratory: diminished at  basis Musculoskeletal: bilateral heal protectors on, left hemiparesis, contractures Neuro: alert, does not talk consistently, does not follow commands consistently     Discharge Instructions You were cared for by a hospitalist during your hospital stay. If you have any questions about your discharge medications or the care you received while you were in the hospital after you are discharged, you can call the unit and asked to speak with the hospitalist on call if the hospitalist that took care of you is not available. Once you are discharged, your primary care physician will handle any further medical issues. Please note that NO REFILLS for any discharge medications will be authorized once you are discharged, as it is imperative that you return to your primary care physician (or establish a relationship with a primary care physician if you do not have one) for your aftercare needs so that they can reassess your need for medications and monitor your lab values.   Allergies as of 06/21/2017   No Known Allergies     Medication List    STOP taking these medications   DECUBI-VITE PO   feeding supplement (PRO-STAT SUGAR FREE 64) Liqd   insulin detemir 100 UNIT/ML injection Commonly known as:  LEVEMIR   insulin glargine 100 UNIT/ML injection Commonly known as:  LANTUS   nicotine 21 mg/24hr patch Commonly known as:  NICODERM CQ - dosed in mg/24 hours   NUTRITIONAL SUPPLEMENT PLUS PO   pravastatin 80 MG tablet Commonly known as:  PRAVACHOL   Vitamin D 2000 units Caps     TAKE these medications   acetaminophen 500 MG tablet Commonly known as:  TYLENOL  Take 1 tablet (500 mg total) every 6 (six) hours as needed by mouth for mild pain, moderate pain or headache.   amoxicillin 500 MG capsule Commonly known as:  AMOXIL Take 1 capsule (500 mg total) by mouth 3 (three) times daily for 14 days.   BIOFREEZE 4 % Gel Generic drug:  Menthol (Topical Analgesic) Apply 1 application topically 2  (two) times daily as needed (pain). Left knee   Chlorhexidine Gluconate Cloth 2 % Pads Apply 6 each topically daily at 6 (six) AM.   diltiazem 180 MG 24 hr capsule Commonly known as:  CARDIZEM CD Take 1 capsule (180 mg total) daily by mouth.   ELIQUIS 5 MG Tabs tablet Generic drug:  apixaban Take 5 mg by mouth 2 (two) times daily.   insulin aspart 100 UNIT/ML injection Commonly known as:  novoLOG Inject 0-9 Units 3 (three) times daily with meals into the skin. CBG < 70: implement hypoglycemia protocol CBG 70 - 120: 0 units CBG 121 - 150: 1 unit CBG 151 - 200: 2 units CBG 201 - 250: 3 units CBG 251 - 300: 5 units CBG 301 - 350: 7 units CBG 351 - 400: 9 units CBG > 400: call MD. What changed:  Another medication with the same name was removed. Continue taking this medication, and follow the directions you see here.   ipratropium 0.02 % nebulizer solution Commonly known as:  ATROVENT Take 0.5 mg by nebulization every 6 (six) hours as needed for wheezing or shortness of breath.   metFORMIN 850 MG tablet Commonly known as:  GLUCOPHAGE Take 850 mg by mouth 2 (two) times daily with a meal.   mupirocin ointment 2 % Commonly known as:  BACTROBAN Place 1 application into the nose 2 (two) times daily.   nystatin 100000 UNIT/ML suspension Commonly known as:  MYCOSTATIN Take 5 mLs (500,000 Units total) by mouth 4 (four) times daily.   ondansetron 4 MG tablet Commonly known as:  ZOFRAN Take 4 mg by mouth every 6 (six) hours as needed for nausea or vomiting.   RESOURCE THICKENUP CLEAR Powd Oral, As needed, other   SORE THROAT SPRAY 1.4 % Liqd Generic drug:  phenol Use as directed 1 spray in the mouth or throat every 2 (two) hours as needed for throat irritation / pain.   traMADol 50 MG tablet Commonly known as:  ULTRAM Take 1 tablet (50 mg total) every 6 (six) hours as needed by mouth for severe pain.      No Known Allergies Follow-up Information    return to snf with  hospice care Follow up.            The results of significant diagnostics from this hospitalization (including imaging, microbiology, ancillary and laboratory) are listed below for reference.    Significant Diagnostic Studies: Dg Chest Port 1 View  Result Date: 06/18/2017 CLINICAL DATA:  Shortness of breath EXAM: PORTABLE CHEST 1 VIEW COMPARISON:  01/25/2017 FINDINGS: Patchy bibasilar airspace opacities, left greater than right concerning for pneumonia. Heart is upper limits normal in size. No visible effusions or acute bony abnormality. Degenerative changes in the shoulders. IMPRESSION: Patchy bibasilar opacities, left greater than right concerning for pneumonia. Electronically Signed   By: Charlett Nose M.D.   On: 06/18/2017 07:43    Microbiology: Recent Results (from the past 240 hour(s))  Blood Culture (routine x 2)     Status: Abnormal   Collection Time: 06/18/17  7:30 AM  Result Value Ref Range Status  Specimen Description BLOOD RIGHT ANTECUBITAL  Final   Special Requests   Final    BOTTLES DRAWN AEROBIC AND ANAEROBIC Blood Culture adequate volume   Culture  Setup Time   Final    Organism ID to follow GRAM POSITIVE COCCI CRITICAL RESULT CALLED TO, READ BACK BY AND VERIFIED WITH: J LEDFORD PHARMD 06/19/17 0304 JDW IN BOTH AEROBIC AND ANAEROBIC BOTTLES Performed at [facility], [address], [place], [place] [address]    Culture ENTEROCOCCUS FAECALIS (A)  Final   Report Status 06/21/2017 FINAL  Final   Organism ID, Bacteria ENTEROCOCCUS FAECALIS  Final      Susceptibility   Enterococcus faecalis - MIC*    AMPICILLIN <=2 SENSITIVE Sensitive     VANCOMYCIN 1 SENSITIVE Sensitive     GENTAMICIN SYNERGY RESISTANT Resistant     * ENTEROCOCCUS FAECALIS  Blood Culture ID Panel (Reflexed)     Status: Abnormal   Collection Time: 06/18/17  7:30 AM  Result Value Ref Range Status   Enterococcus species NOT DETECTED NOT DETECTED Final   Listeria monocytogenes NOT DETECTED NOT  DETECTED Final   Staphylococcus species DETECTED (A) NOT DETECTED Final    Comment: Methicillin (oxacillin) susceptible coagulase negative staphylococcus. Possible blood culture contaminant (unless isolated from more than one blood culture draw or clinical case suggests pathogenicity). No antibiotic treatment is indicated for blood  culture contaminants. CRITICAL RESULT CALLED TO, READ BACK BY AND VERIFIED WITH: J LEDFORD PHARMD 06/19/17 0304 JDW    Staphylococcus aureus NOT DETECTED NOT DETECTED Final   Methicillin resistance NOT DETECTED NOT DETECTED Final   Streptococcus species NOT DETECTED NOT DETECTED Final   Streptococcus agalactiae NOT DETECTED NOT DETECTED Final   Streptococcus pneumoniae NOT DETECTED NOT DETECTED Final   Streptococcus pyogenes NOT DETECTED NOT DETECTED Final   Acinetobacter baumannii NOT DETECTED NOT DETECTED Final   Enterobacteriaceae species NOT DETECTED NOT DETECTED Final   Enterobacter cloacae complex NOT DETECTED NOT DETECTED Final   Escherichia coli NOT DETECTED NOT DETECTED Final   Klebsiella oxytoca NOT DETECTED NOT DETECTED Final   Klebsiella pneumoniae NOT DETECTED NOT DETECTED Final   Proteus species NOT DETECTED NOT DETECTED Final   Serratia marcescens NOT DETECTED NOT DETECTED Final   Haemophilus influenzae NOT DETECTED NOT DETECTED Final   Neisseria meningitidis NOT DETECTED NOT DETECTED Final   Pseudomonas aeruginosa NOT DETECTED NOT DETECTED Final   Candida albicans NOT DETECTED NOT DETECTED Final   Candida glabrata NOT DETECTED NOT DETECTED Final   Candida krusei NOT DETECTED NOT DETECTED Final   Candida parapsilosis NOT DETECTED NOT DETECTED Final   Candida tropicalis NOT DETECTED NOT DETECTED Final    Comment: Performed at [facility], [address] [place], [place] [address]  Blood Culture (routine x 2)     Status: None (Preliminary result)   Collection Time: 06/18/17  9:33 AM  Result Value Ref Range Status   Specimen Description  BLOOD LEFT HAND  Final   Special Requests   Final    BOTTLES DRAWN AEROBIC AND ANAEROBIC Blood Culture adequate volume   Culture   Final    NO GROWTH 2 DAYS Performed at [facility], [address], [place], [place] [address]    Report Status PENDING  Incomplete  MRSA PCR Screening     Status: Abnormal   Collection Time: 06/18/17  8:38 PM  Result Value Ref Range Status   MRSA by PCR POSITIVE (A) NEGATIVE Final  Comment:        The GeneXpert MRSA Assay (FDA approved for NASAL specimens only), is one component of a comprehensive MRSA colonization surveillance program. It is not intended to diagnose MRSA infection nor to guide or monitor treatment for MRSA infections. RESULT CALLED TO, READ BACK BY AND VERIFIED WITH: HALL RN 06/19/17 0306 JDW Performed at [facility], [address], [place], [place] [address]      Labs: Basic Metabolic Panel: Recent Labs  Lab 06/18/17 0730 06/19/17 0952  NA 136 137  K 4.6 4.2  CL 103 105  CO2 23 25  GLUCOSE 208* 119*  BUN 20 19  CREATININE 0.68 0.59  CALCIUM 8.3* 8.1*   Liver Function Tests: Recent Labs  Lab 06/18/17 0730  AST 22  ALT 18  ALKPHOS 113  BILITOT 0.6  PROT 5.6*  ALBUMIN 2.1*   No results for input(s): LIPASE, AMYLASE in the last 168 hours. No results for input(s): AMMONIA in the last 168 hours. CBC: Recent Labs  Lab 06/18/17 0730 06/19/17 0952 06/20/17 0627  WBC 21.2* 12.2* 11.1*  NEUTROABS 18.9*  --  8.3*  HGB 17.1* 14.9 15.6*  HCT 52.4* 46.6* 47.6*  MCV 91.4 90.7 90.5  PLT 348 291 332   Cardiac Enzymes: Recent Labs  Lab 06/18/17 0730  TROPONINI <0.03   BNP: BNP (last 3 results) Recent Labs    06/18/17 0724  BNP 286.1*    ProBNP (last 3 results) No results for input(s): PROBNP in the last 8760 hours.  CBG: Recent Labs  Lab 06/20/17 2358 06/21/17 0028 06/21/17 0428 06/21/17 0802 06/21/17 0850  GLUCAP 20* 81 60* 61* 143*       Signed:  Albertine Grates MD,  PhD  [facility] 06/21/2017, 9:42 AM

## 2017-06-21 NOTE — Progress Notes (Signed)
Patient will discharge to [facility] with hospice services this afternoon. SNF awaiting patient's son to complete readmission paperwork at 2:30 and then will accept patient. CSW to support with arranging transport to the facility this afternoon. Discharge summary sent to the facility.  Abigail Butts, LCSWA [phone]

## 2017-06-21 NOTE — Progress Notes (Addendum)
CSW received phone call from RN. [facility] came to pick pt up, but refused to take pt due to blood sugar earlier.    CSW rescheduled [facility]. CSW updated floor that PTAR has been rescheduled. PTAR reported that they will be on their way shortly.  Montine Circle, [facility]  [phone]

## 2017-06-21 NOTE — Progress Notes (Signed)
Patient will discharge to [facility].  Anticipated discharge date: 06/21/17 Family notified: Mikalia Fessel, son Transportation by: [facility]  Nurse to call report to [phone].   CSW signing off.  Abigail Butts, LCSWA  Clinical Social Worker

## 2017-06-21 NOTE — Clinical Social Work Placement (Signed)
   CLINICAL SOCIAL WORK PLACEMENT  NOTE  Date:  06/21/2017  Patient Details  Name: [name] MRN: [id number] Date of Birth: 08/06/1935  Clinical Social Work is seeking post-discharge placement for this patient at the Skilled  Nursing Facility level of care (*CSW will initial, date and re-position this form in  chart as items are completed):  Yes   Patient/family provided with [facility] Clinical Social Work Department's list of facilities offering this level of care within the geographic area requested by the patient (or if unable, by the patient's family).  Yes   Patient/family informed of their freedom to choose among providers that offer the needed level of care, that participate in Medicare, Medicaid or managed care program needed by the patient, have an available bed and are willing to accept the patient.  Yes   Patient/family informed of [facility]'s ownership interest in [facility] and [facility], as well as of the fact that they are under no obligation to receive care at these facilities.  PASRR submitted to EDS on       PASRR number received on       Existing PASRR number confirmed on 06/20/17     FL2 transmitted to all facilities in geographic area requested by pt/family on 06/20/17     FL2 transmitted to all facilities within larger geographic area on       Patient informed that his/her managed care company has contracts with or will negotiate with certain facilities, including the following:  [facility]     Yes   Patient/family informed of bed offers received.  Patient chooses bed at [facility]     Physician recommends and patient chooses bed at      Patient to be transferred to [facility] on 06/21/17.  Patient to be transferred to facility by [facility]     Patient family notified on 06/21/17 of transfer.  Name of family member notified:  Leondra Cullin, son     PHYSICIAN       Additional Comment:     _______________________________________________ Abigail Butts, LCSW 06/21/2017, 9:51 AM

## 2017-06-21 NOTE — Progress Notes (Signed)
C/O BLE ankle pain unrelieved by Tylenol. Tramadol ordered but pt refused saying it doesn't work.

## 2017-06-21 NOTE — Progress Notes (Signed)
Dr. Roda Shutters notified of CBG = 449 after treatment for CBG = 62 with Glucose gel. OK to discharge when [facility] arrives per Dr. Roda Shutters. Report called to [facility], RN at [facility].

## 2017-06-23 LAB — CULTURE, BLOOD (ROUTINE X 2)
Culture: NO GROWTH
Special Requests: ADEQUATE

## 2017-06-25 LAB — CULTURE, BLOOD (ROUTINE X 2)
CULTURE: NO GROWTH
CULTURE: NO GROWTH

## 2017-07-21 DEATH — deceased

## 2019-06-16 IMAGING — DX DG SHOULDER 2+V*R*
2 series · 2 of 2 positions shown · non-contrast
Comparison: Prior radiographs of the right shoulder 12/26/2016

CLINICAL DATA: 80-year-old female with acute right shoulder/ upper
extremity pain after a recent fall.

EXAM:
RIGHT SHOULDER - 2+ VIEW

[shoulder axial]
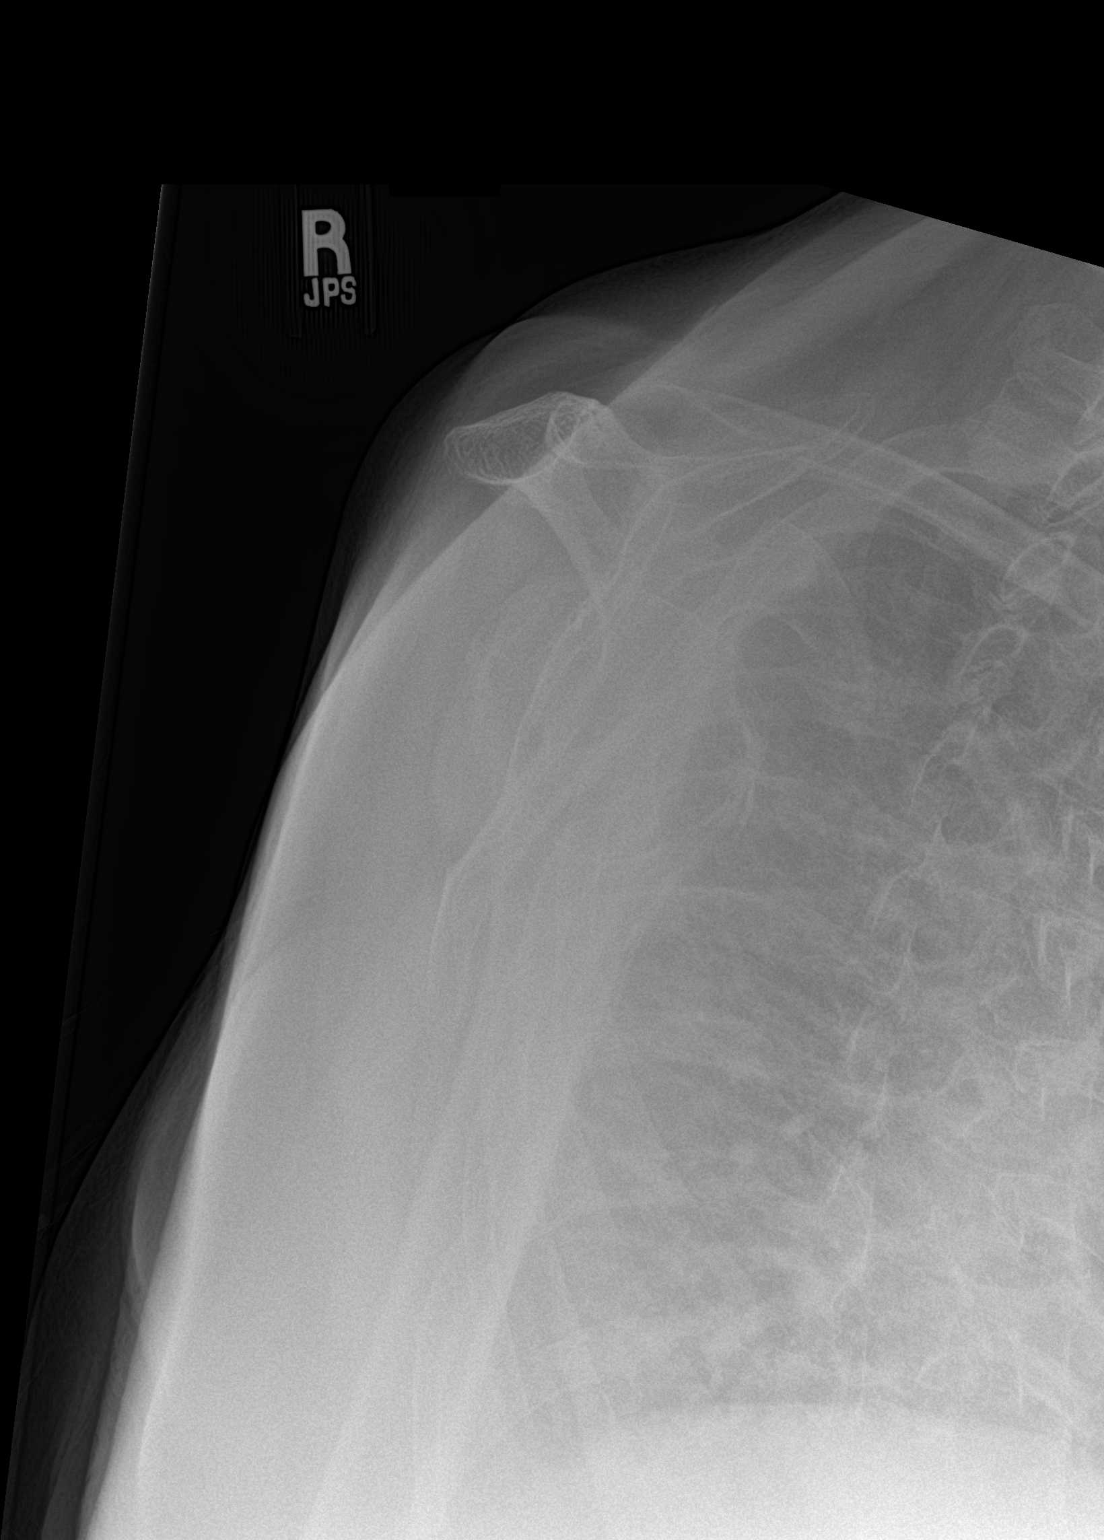

[shoulder ap]
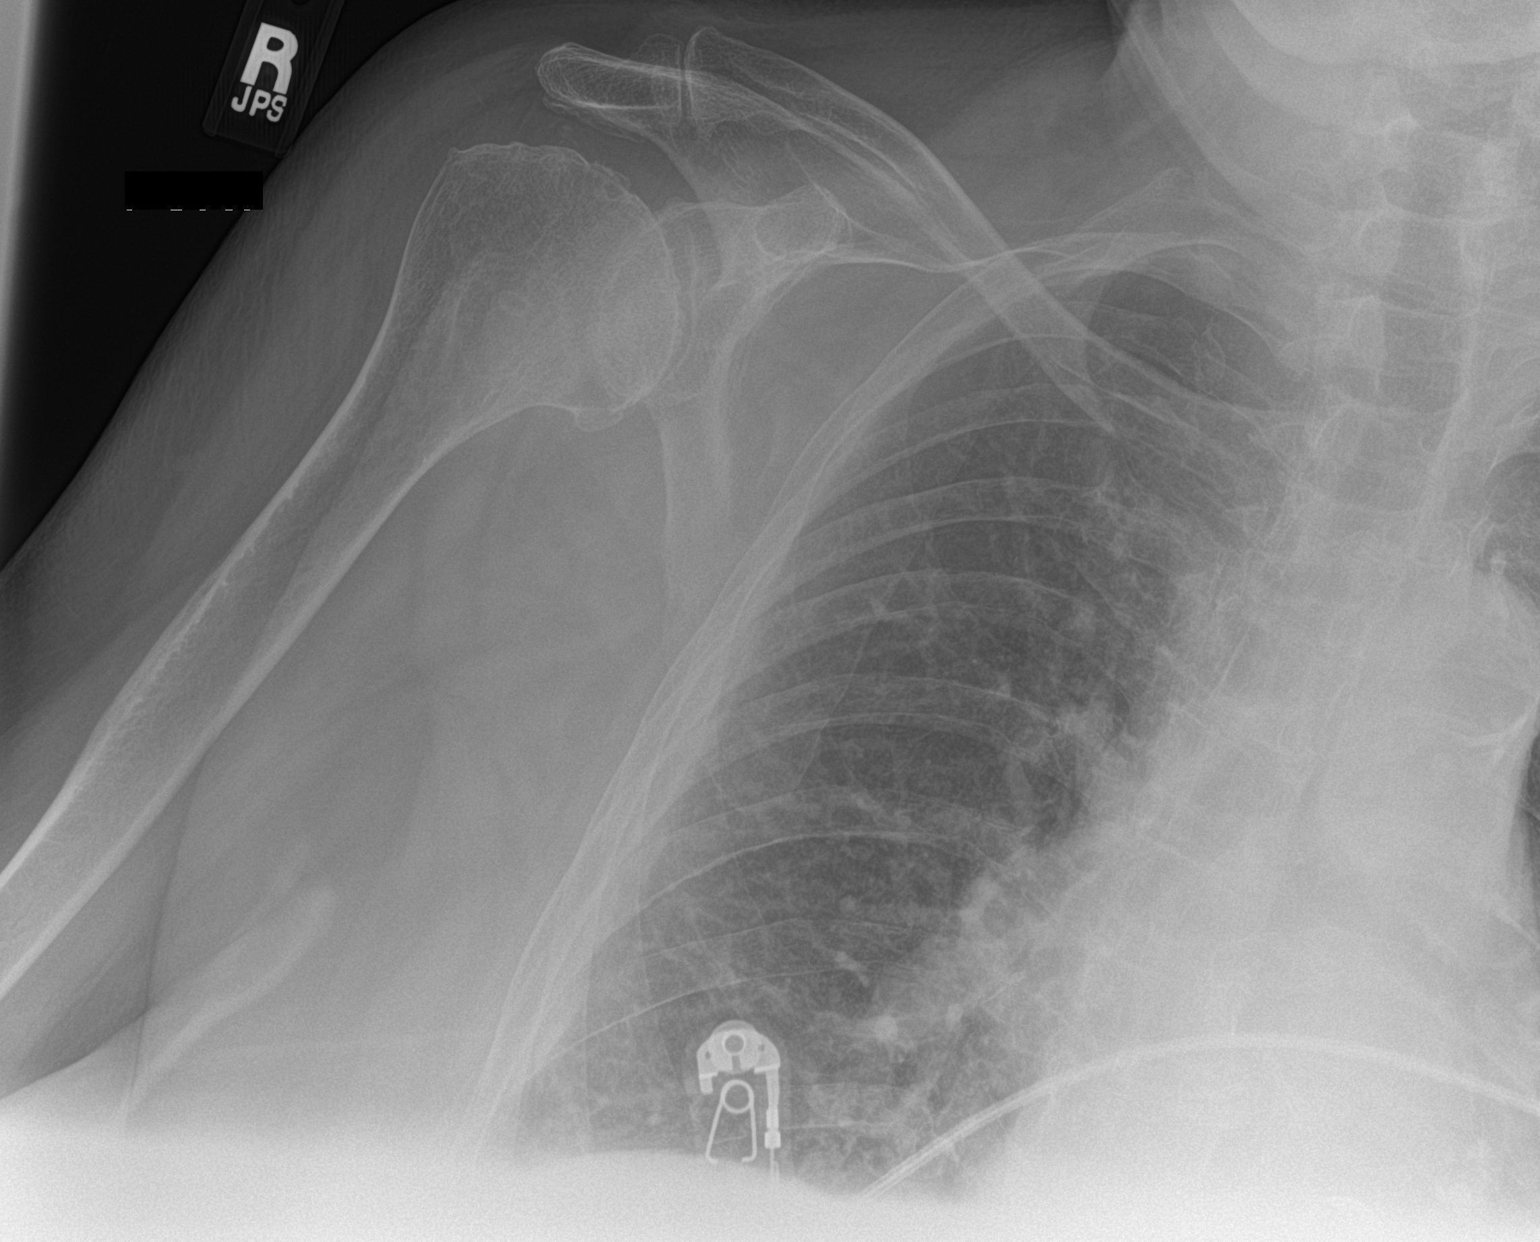

[2 of 2 positions shown; findings below may reference images not displayed]

FINDINGS: No displaced fracture identified. The humeral head is located. Mild
glenohumeral and acromioclavicular joint osteoarthritis. Narrowing
of the subacromial space again present. The visualized lungs clear.
No bony lesion.
IMPRESSION: 1. No acute fracture visualized.
2. Mild right glenohumeral and acromioclavicular joint
osteoarthritis.
3. Narrowing of the subacromial space again visualized which can be
seen in the setting of chronic rotator cuff injury.

## 2019-06-16 IMAGING — DX DG HUMERUS 2V *R*
3 series · 3 of 3 positions shown · non-contrast
Comparison: None.

CLINICAL DATA: Acute pain of right shoulder.  Recent fall.

EXAM:
RIGHT HUMERUS - 2+ VIEW

[humerus ap (1 of 2)]
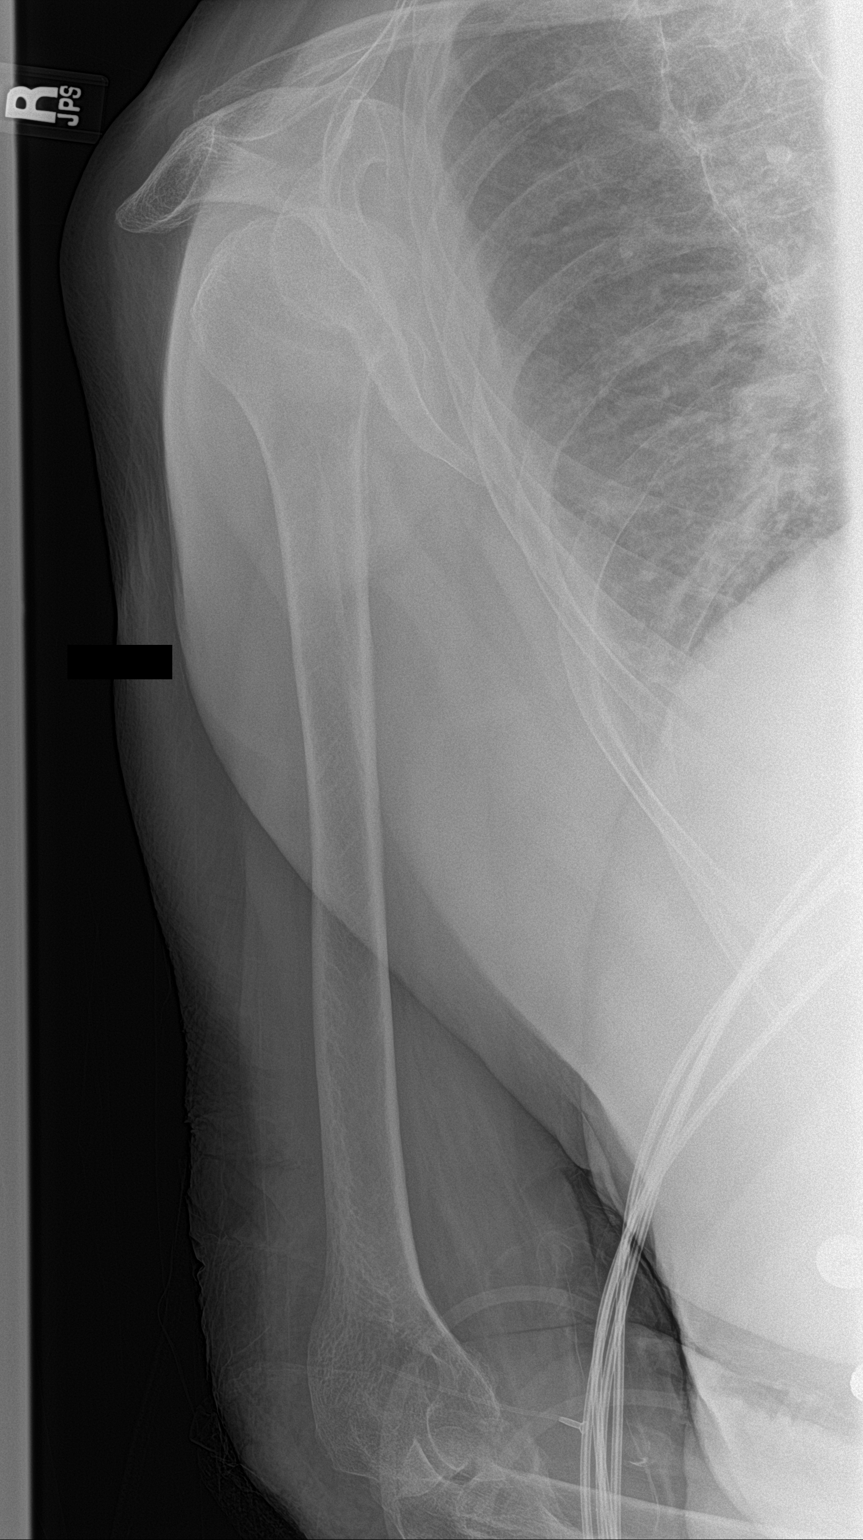

[humerus lat]
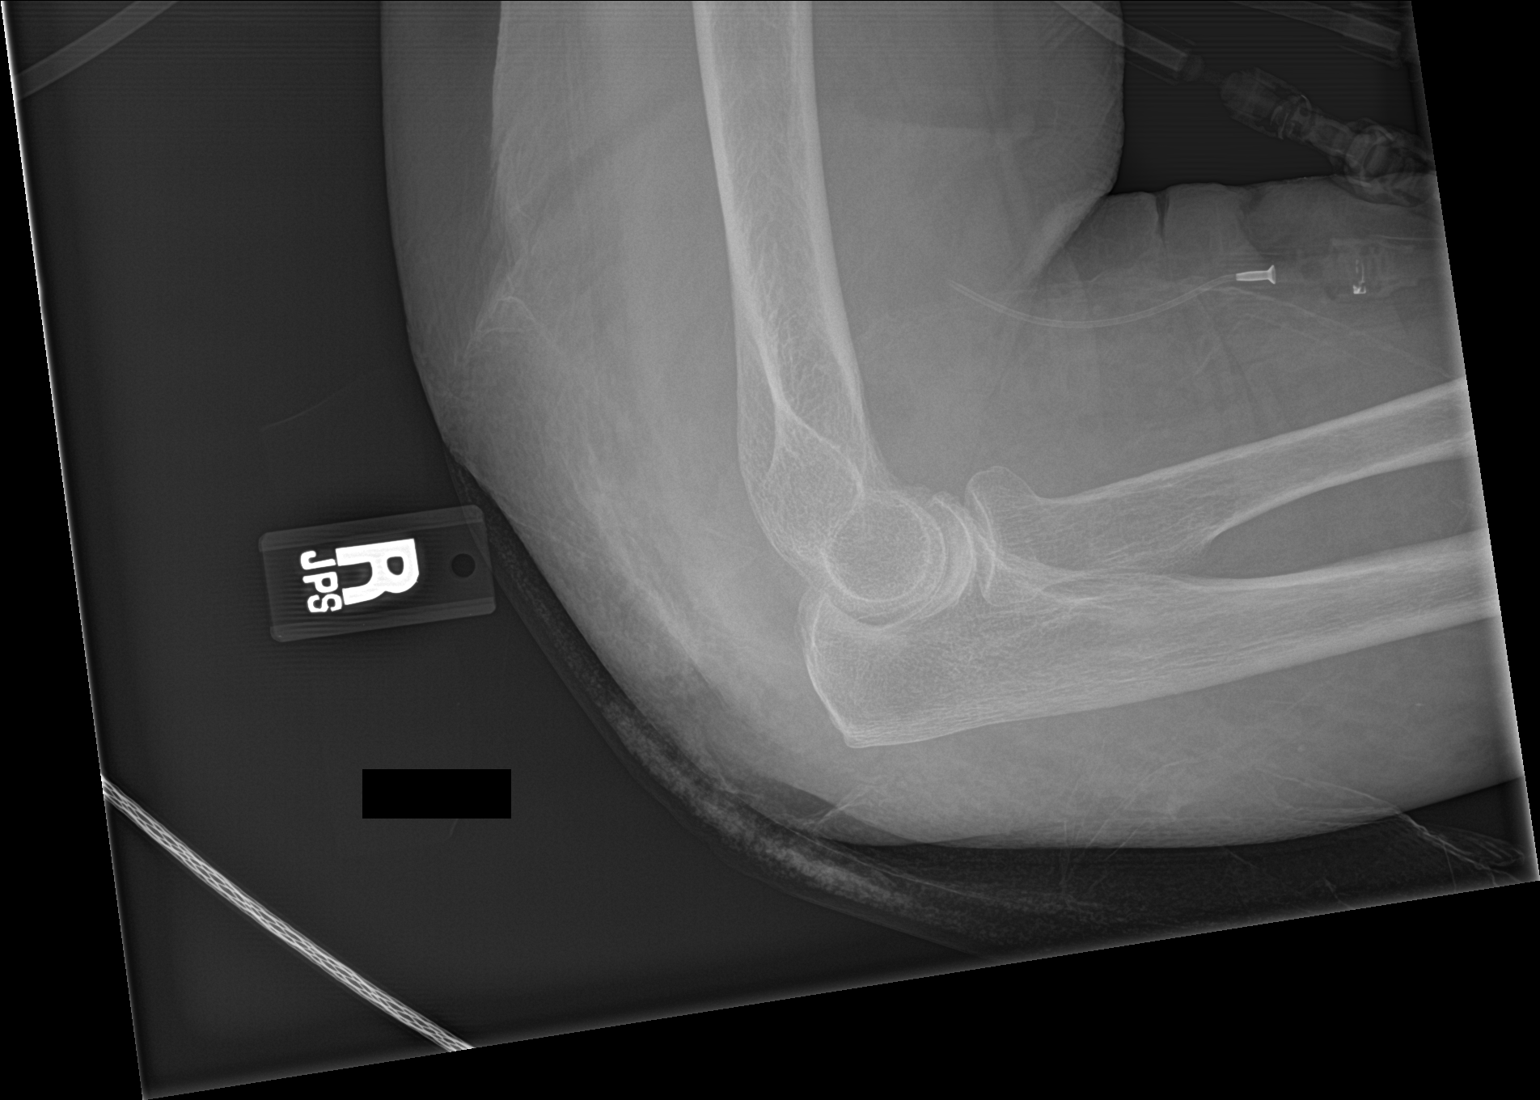

[humerus ap (2 of 2)]
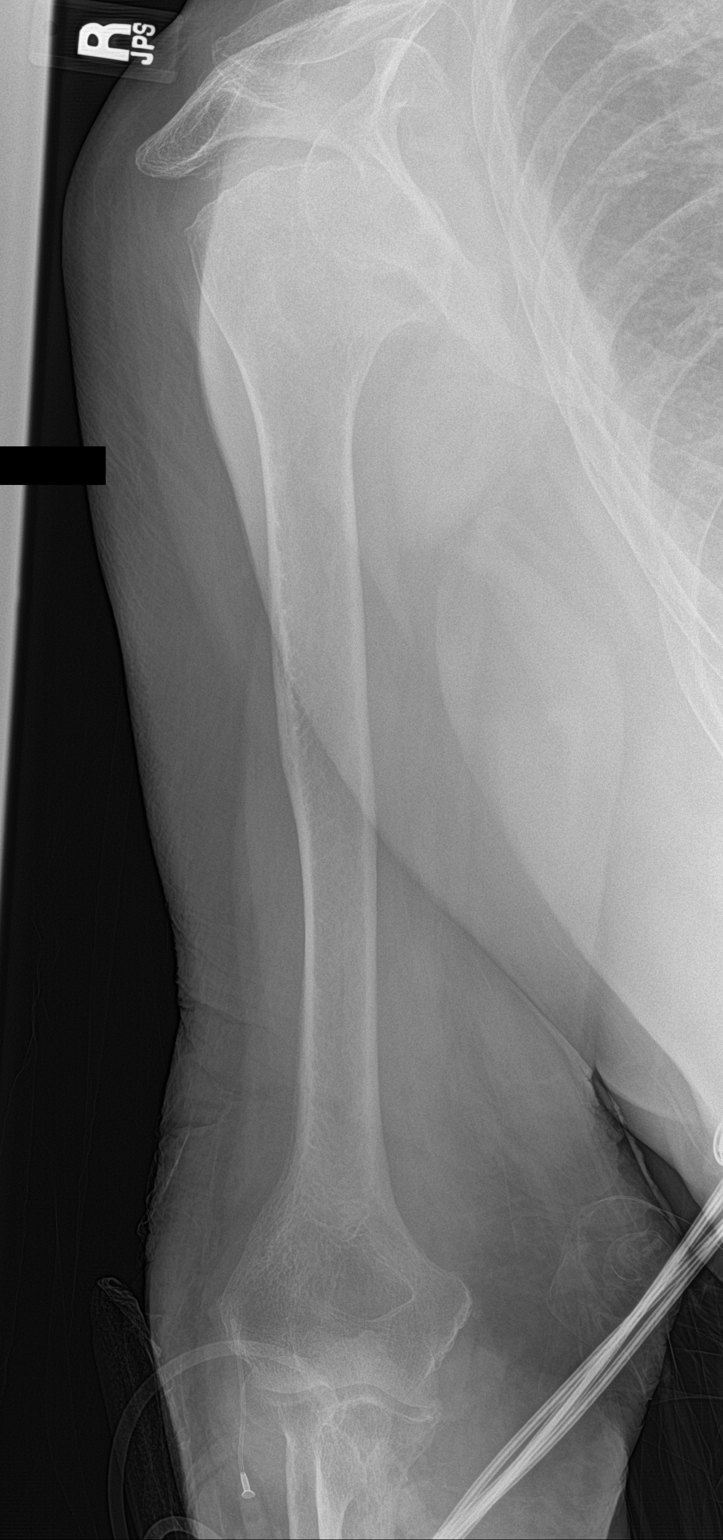

[3 of 3 positions shown; findings below may reference images not displayed]

FINDINGS: There is no evidence of fracture or other focal bone lesions. Soft
tissues are unremarkable.
IMPRESSION: Negative.

## 2019-06-16 IMAGING — MR MR HEAD W/O CM
6 series · 38 of 48 positions shown · non-contrast
Comparison: CTA head and neck and head CTs 02/27/2016.

CLINICAL DATA: 80 y/o female with right MCA infarct superimposed on
posttraumatic appearing findings including right face and scalp
hematoma, and right side extra-axial intracranial hemorrhage.

EXAM:
MRI HEAD WITHOUT CONTRAST
TECHNIQUE: Multiplanar, multiecho pulse sequences of the brain and surrounding
structures were obtained without intravenous contrast.

[Series 3: DWI · axial · 3.0mm · 0.94mm/px · z∈[-67,+75]mm · 9 of 98 slices shown (1 of 2)]
[im 1/98]
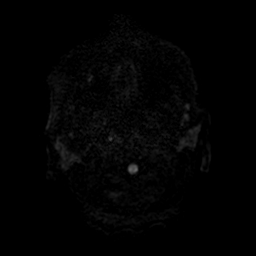
[im 17/98]
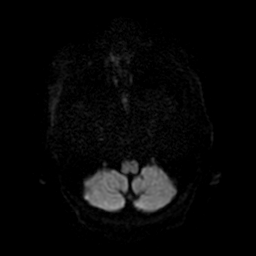
[im 33/98]
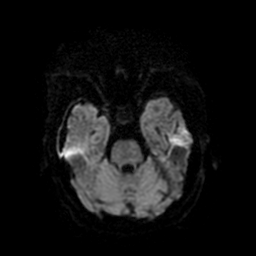
[im 41/98]
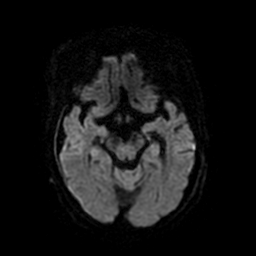
[im 49/98]
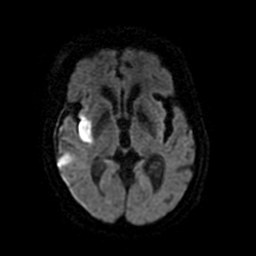
[im 57/98]
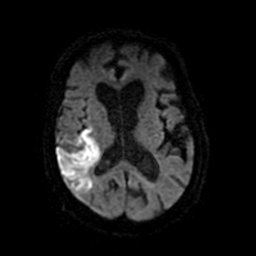
[im 65/98]
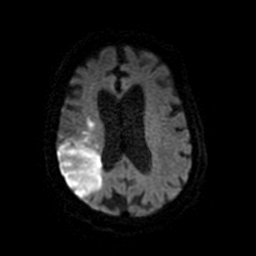
[im 81/98]
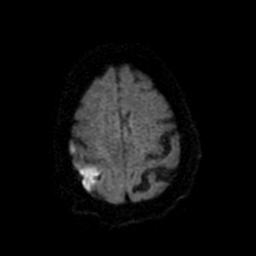
[im 98/98]
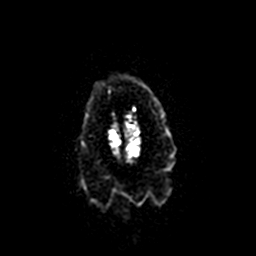

[Series 4: DWI · coronal · 4.0mm · 0.94mm/px · 9 of 68 slices shown (2 of 2)]
[im 1/68]
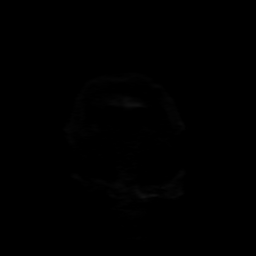
[im 9/68]
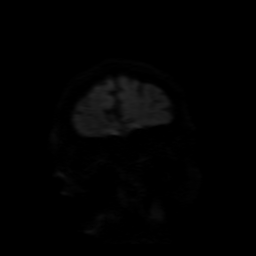
[im 17/68]
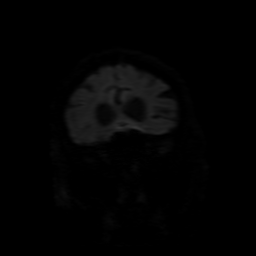
[im 26/68]
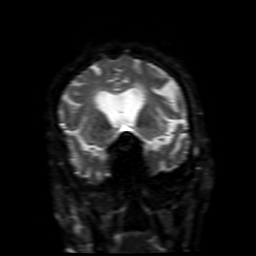
[im 34/68]
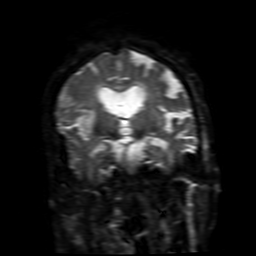
[im 42/68]
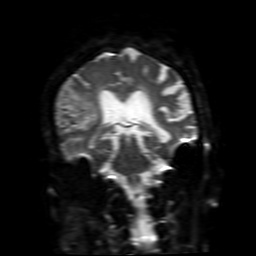
[im 51/68]
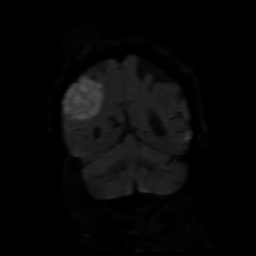
[im 59/68]
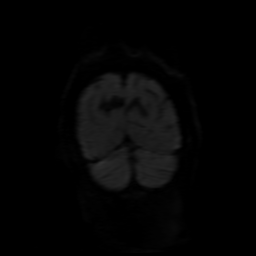
[im 68/68]
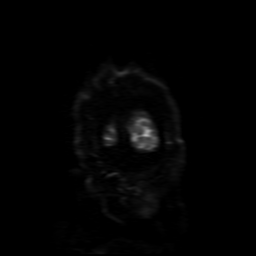

[Series 5: FLAIR · axial · 3.0mm · 0.47mm/px · z∈[-67,+75]mm · 3 of 25 slices shown]
[im 1/25]
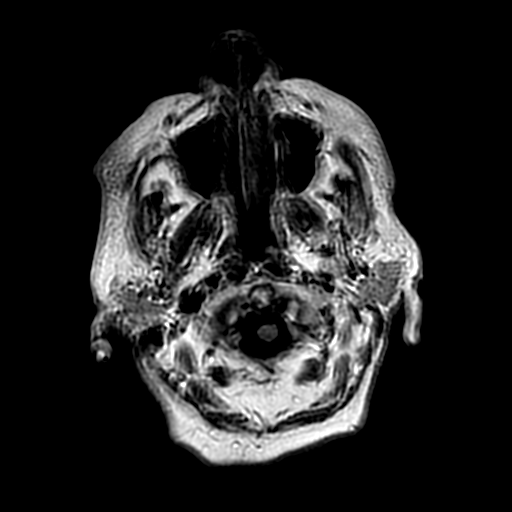
[im 13/25]
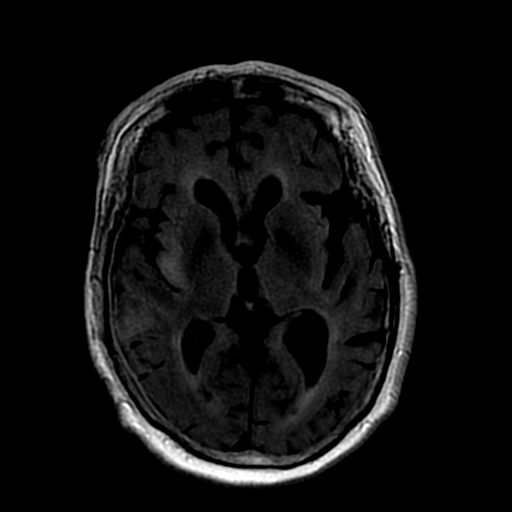
[im 25/25]
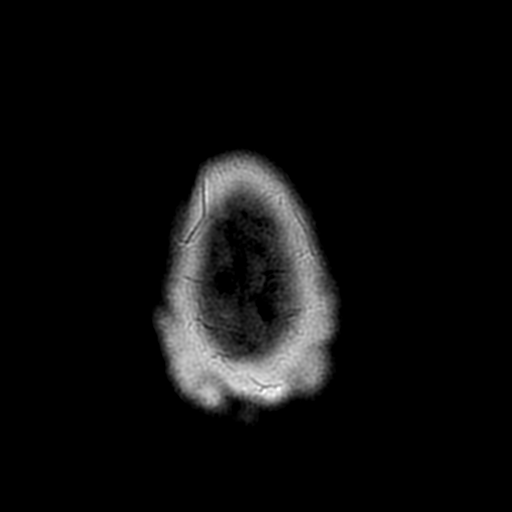

[Series 6: (person_name) · axial · 3.0mm · 0.47mm/px · z∈[-69,+53]mm · 7 of 100 slices shown]
[im 1/100]
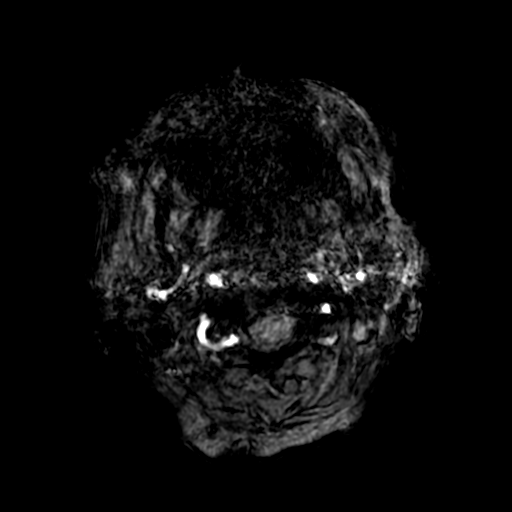
[im 17/100]
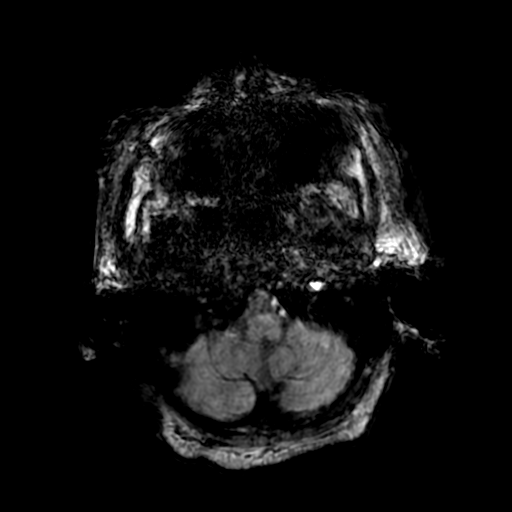
[im 34/100]
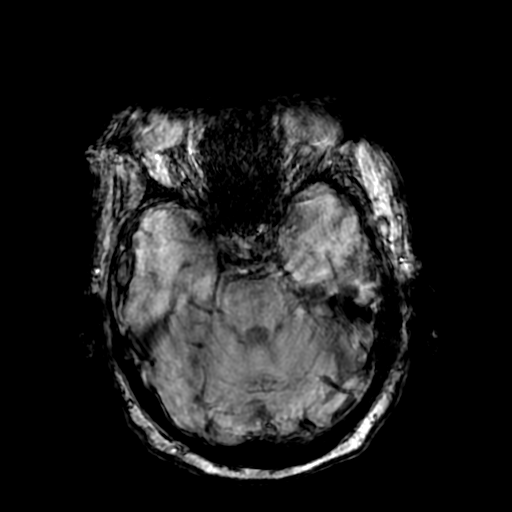
[im 42/100]
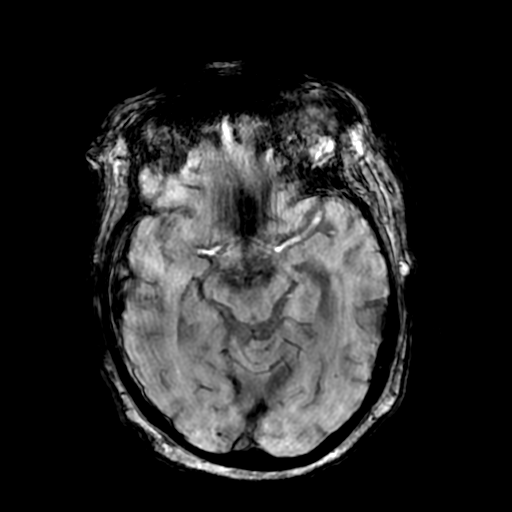
[im 58/100]
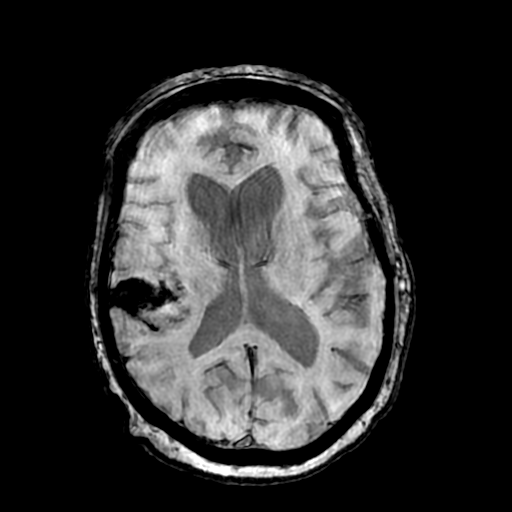
[im 67/100]
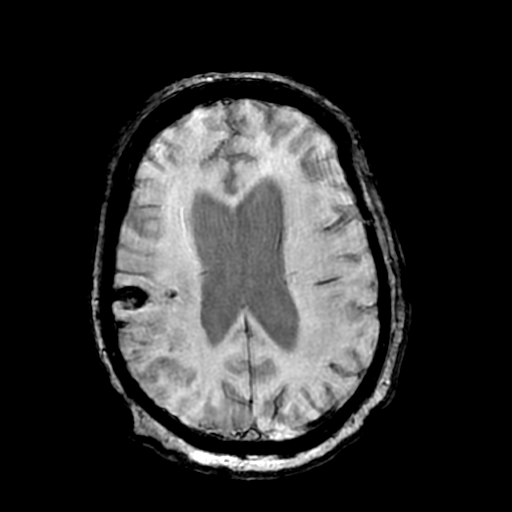
[im 83/100]
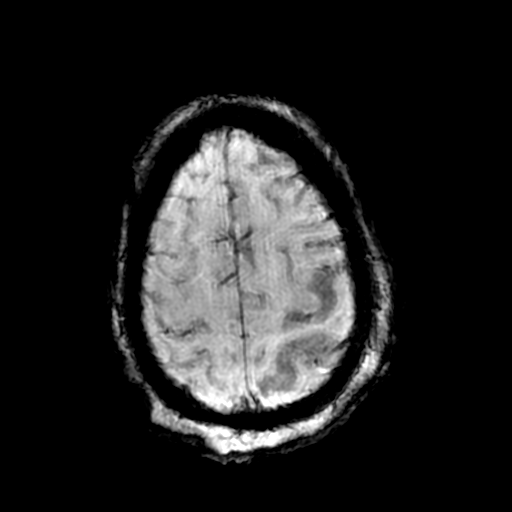

[Series 350: ADC · axial · 3.0mm · 0.94mm/px · z∈[-67,+75]mm · 6 of 49 slices shown (1 of 2)]
[im 1/49]
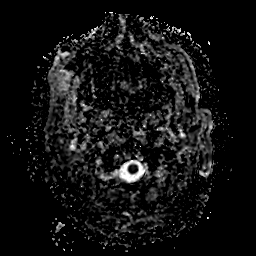
[im 10/49]
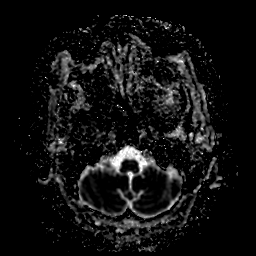
[im 20/49]
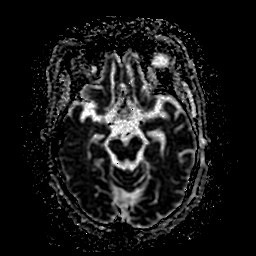
[im 29/49]
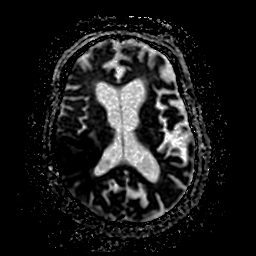
[im 39/49]
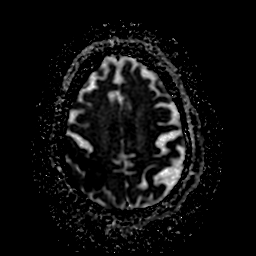
[im 49/49]
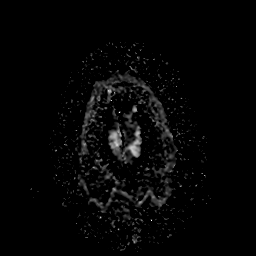

[Series 450: ADC · coronal · 4.0mm · 0.94mm/px · 4 of 34 slices shown (2 of 2)]
[im 1/34]
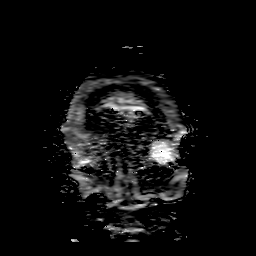
[im 12/34]
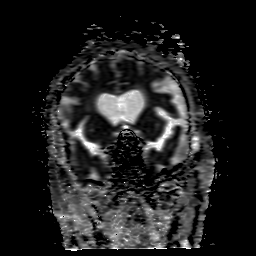
[im 23/34]
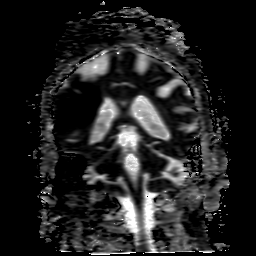
[im 34/34]
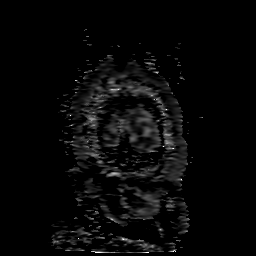

[38 of 48 positions shown; findings below may reference images not displayed]

FINDINGS: The examination had to be discontinued prior to completion due to
patient refusal to continue. Axial in coronal diffusion weighted
imaging was obtained along with axial FLAIR and T2* imaging.

Confluent 5 cm area of restricted diffusion in the posterior right
MCA territory, worst in the right parietal lobe. Involvement of the
posterior right insula. Confluent involvement of the posterior most
right corona radiata. Scattered involvement of the right motor strip
(series 3, image 42.).

No contralateral or posterior fossa restricted diffusion.

Along the posterior right operculum area of infarction there is
confluent petechial hemorrhage (series 6, image 57). But there is
only mild mass effect on the right lateral ventricle and no midline
shift.

Superimposed 10 mm biconvex extra-axial hemorrhage along the lateral
left temporal lobe is now felt to be a small epidural hematoma in
light of the subtle nondepressed skull fracture. Axial FLAIR images
do suggest there is a trace superimposed broad-based right side
subdural hematoma (series 5, image 13).

Patent basilar cisterns. No ventriculomegaly. Mild superimposed
bilateral cerebral white matter and left deep cerebellar nuclei
FLAIR hyperintensity.
IMPRESSION: 1. Confluent acute infarct involving a 5 cm area of the posterior
right MCA territory with petechial hemorrhage but only mild regional
mass effect.
2. Superimposed right hemisphere combination of epidural (stable,
adjacent to the right temporal lobe with associated subtle skull
fracture) and trace subdural hematoma. These are thought to be
posttraumatic and unrelated to #1.
3.  The examination had to be discontinued prior to completion.
